# Patient Record
Sex: Female | Born: 1985
Health system: Southern US, Community
[De-identification: ages and names within clinical notes are randomized; demographics above are authoritative.]

## PROBLEM LIST (undated history)

## (undated) DIAGNOSIS — J302 Other seasonal allergic rhinitis: Secondary | ICD-10-CM

## (undated) DIAGNOSIS — E559 Vitamin D deficiency, unspecified: Secondary | ICD-10-CM

## (undated) DIAGNOSIS — K5900 Constipation, unspecified: Secondary | ICD-10-CM

## (undated) DIAGNOSIS — F419 Anxiety disorder, unspecified: Secondary | ICD-10-CM

## (undated) DIAGNOSIS — G4730 Sleep apnea, unspecified: Secondary | ICD-10-CM

## (undated) DIAGNOSIS — T7840XA Allergy, unspecified, initial encounter: Secondary | ICD-10-CM

## (undated) DIAGNOSIS — F32A Depression, unspecified: Secondary | ICD-10-CM

## (undated) DIAGNOSIS — F329 Major depressive disorder, single episode, unspecified: Secondary | ICD-10-CM

## (undated) DIAGNOSIS — D649 Anemia, unspecified: Secondary | ICD-10-CM

## (undated) DIAGNOSIS — K219 Gastro-esophageal reflux disease without esophagitis: Secondary | ICD-10-CM

## (undated) DIAGNOSIS — I10 Essential (primary) hypertension: Secondary | ICD-10-CM

## (undated) DIAGNOSIS — E282 Polycystic ovarian syndrome: Secondary | ICD-10-CM

## (undated) HISTORY — DX: Anemia, unspecified: D64.9

## (undated) HISTORY — DX: Sleep apnea, unspecified: G47.30

## (undated) HISTORY — DX: Vitamin D deficiency, unspecified: E55.9

## (undated) HISTORY — DX: Allergy, unspecified, initial encounter: T78.40XA

## (undated) HISTORY — DX: Constipation, unspecified: K59.00

## (undated) HISTORY — PX: TONSILLECTOMY: SUR1361

## (undated) HISTORY — PX: GASTRIC BYPASS: SHX52

---

## 2008-03-04 ENCOUNTER — Emergency Department (HOSPITAL_BASED_OUTPATIENT_CLINIC_OR_DEPARTMENT_OTHER): Admission: EM | Admit: 2008-03-04 | Discharge: 2008-03-04 | Payer: Self-pay | Admitting: Emergency Medicine

## 2008-05-19 ENCOUNTER — Emergency Department (HOSPITAL_BASED_OUTPATIENT_CLINIC_OR_DEPARTMENT_OTHER): Admission: EM | Admit: 2008-05-19 | Discharge: 2008-05-19 | Payer: Self-pay | Admitting: Emergency Medicine

## 2008-05-28 ENCOUNTER — Emergency Department (HOSPITAL_BASED_OUTPATIENT_CLINIC_OR_DEPARTMENT_OTHER): Admission: EM | Admit: 2008-05-28 | Discharge: 2008-05-28 | Payer: Self-pay | Admitting: Emergency Medicine

## 2009-10-23 ENCOUNTER — Emergency Department (HOSPITAL_BASED_OUTPATIENT_CLINIC_OR_DEPARTMENT_OTHER): Admission: EM | Admit: 2009-10-23 | Discharge: 2009-10-23 | Payer: Self-pay | Admitting: Emergency Medicine

## 2010-03-04 ENCOUNTER — Emergency Department (HOSPITAL_COMMUNITY): Admission: EM | Admit: 2010-03-04 | Discharge: 2010-03-04 | Payer: Self-pay | Admitting: Family Medicine

## 2010-08-27 ENCOUNTER — Emergency Department (HOSPITAL_BASED_OUTPATIENT_CLINIC_OR_DEPARTMENT_OTHER)
Admission: EM | Admit: 2010-08-27 | Discharge: 2010-08-27 | Disposition: A | Discharge status: Home / Self Care | Payer: BC Managed Care – PPO | Attending: Emergency Medicine | Admitting: Emergency Medicine

## 2010-08-27 DIAGNOSIS — N898 Other specified noninflammatory disorders of vagina: Secondary | ICD-10-CM | POA: Insufficient documentation

## 2010-08-27 DIAGNOSIS — N949 Unspecified condition associated with female genital organs and menstrual cycle: Secondary | ICD-10-CM | POA: Insufficient documentation

## 2010-08-27 DIAGNOSIS — E669 Obesity, unspecified: Secondary | ICD-10-CM | POA: Insufficient documentation

## 2010-08-27 DIAGNOSIS — F411 Generalized anxiety disorder: Secondary | ICD-10-CM | POA: Insufficient documentation

## 2010-08-27 DIAGNOSIS — K219 Gastro-esophageal reflux disease without esophagitis: Secondary | ICD-10-CM | POA: Insufficient documentation

## 2010-08-27 DIAGNOSIS — I10 Essential (primary) hypertension: Secondary | ICD-10-CM | POA: Insufficient documentation

## 2010-08-27 DIAGNOSIS — N938 Other specified abnormal uterine and vaginal bleeding: Secondary | ICD-10-CM | POA: Insufficient documentation

## 2010-08-27 LAB — GC/CHLAMYDIA PROBE AMP, GENITAL
Chlamydia, DNA Probe: NEGATIVE
GC Probe Amp, Genital: NEGATIVE

## 2010-08-27 LAB — URINALYSIS, ROUTINE W REFLEX MICROSCOPIC
Bilirubin Urine: NEGATIVE
Nitrite: NEGATIVE
Protein, ur: NEGATIVE mg/dL
Specific Gravity, Urine: 1.046 — ABNORMAL HIGH (ref 1.005–1.030)

## 2010-08-27 LAB — URINE MICROSCOPIC-ADD ON

## 2010-08-27 LAB — WET PREP, GENITAL

## 2010-08-27 LAB — PREGNANCY, URINE: Preg Test, Ur: NEGATIVE

## 2010-08-27 LAB — CBC: WBC: 10.1 10*3/uL (ref 4.0–10.5)

## 2010-10-18 LAB — URINALYSIS, ROUTINE W REFLEX MICROSCOPIC
Bilirubin Urine: NEGATIVE
Glucose, UA: NEGATIVE mg/dL
Ketones, ur: NEGATIVE mg/dL
Nitrite: NEGATIVE
Protein, ur: 30 mg/dL — AB
Specific Gravity, Urine: 1.01 (ref 1.005–1.030)
pH: 7 (ref 5.0–8.0)

## 2010-10-18 LAB — WET PREP, GENITAL: Trich, Wet Prep: NONE SEEN

## 2010-10-18 LAB — RPR: RPR Ser Ql: NONREACTIVE

## 2010-10-18 LAB — URINE MICROSCOPIC-ADD ON

## 2010-10-18 LAB — HERPES SIMPLEX VIRUS CULTURE

## 2010-11-01 ENCOUNTER — Emergency Department (HOSPITAL_BASED_OUTPATIENT_CLINIC_OR_DEPARTMENT_OTHER)
Admission: EM | Admit: 2010-11-01 | Discharge: 2010-11-01 | Disposition: A | Discharge status: Home / Self Care | Payer: BC Managed Care – PPO | Attending: Emergency Medicine | Admitting: Emergency Medicine

## 2010-11-01 DIAGNOSIS — J029 Acute pharyngitis, unspecified: Secondary | ICD-10-CM | POA: Insufficient documentation

## 2010-11-01 DIAGNOSIS — I10 Essential (primary) hypertension: Secondary | ICD-10-CM | POA: Insufficient documentation

## 2010-11-01 DIAGNOSIS — E669 Obesity, unspecified: Secondary | ICD-10-CM | POA: Insufficient documentation

## 2011-04-27 LAB — GC/CHLAMYDIA PROBE AMP, GENITAL: Chlamydia, DNA Probe: NEGATIVE

## 2011-04-27 LAB — URINALYSIS, ROUTINE W REFLEX MICROSCOPIC
Ketones, ur: NEGATIVE
Nitrite: NEGATIVE

## 2011-05-06 ENCOUNTER — Emergency Department (HOSPITAL_BASED_OUTPATIENT_CLINIC_OR_DEPARTMENT_OTHER)
Admission: EM | Admit: 2011-05-06 | Discharge: 2011-05-06 | Disposition: A | Discharge status: Home / Self Care | Payer: BC Managed Care – PPO | Attending: Emergency Medicine | Admitting: Emergency Medicine

## 2011-05-06 ENCOUNTER — Encounter: Payer: Self-pay | Admitting: Emergency Medicine

## 2011-05-06 DIAGNOSIS — A499 Bacterial infection, unspecified: Secondary | ICD-10-CM | POA: Insufficient documentation

## 2011-05-06 DIAGNOSIS — N760 Acute vaginitis: Secondary | ICD-10-CM | POA: Insufficient documentation

## 2011-05-06 DIAGNOSIS — R109 Unspecified abdominal pain: Secondary | ICD-10-CM | POA: Insufficient documentation

## 2011-05-06 DIAGNOSIS — I10 Essential (primary) hypertension: Secondary | ICD-10-CM | POA: Insufficient documentation

## 2011-05-06 DIAGNOSIS — B9689 Other specified bacterial agents as the cause of diseases classified elsewhere: Secondary | ICD-10-CM

## 2011-05-06 DIAGNOSIS — E669 Obesity, unspecified: Secondary | ICD-10-CM | POA: Insufficient documentation

## 2011-05-06 HISTORY — DX: Essential (primary) hypertension: I10

## 2011-05-06 LAB — DIFFERENTIAL
Basophils Absolute: 0 10*3/uL (ref 0.0–0.1)
Basophils Relative: 0 % (ref 0–1)
Eosinophils Absolute: 0.1 10*3/uL (ref 0.0–0.7)
Monocytes Absolute: 0.6 10*3/uL (ref 0.1–1.0)
Neutro Abs: 4.1 10*3/uL (ref 1.7–7.7)
Neutrophils Relative %: 61 % (ref 43–77)

## 2011-05-06 LAB — URINALYSIS, ROUTINE W REFLEX MICROSCOPIC
Bilirubin Urine: NEGATIVE
Leukocytes, UA: NEGATIVE
Nitrite: NEGATIVE
Protein, ur: NEGATIVE mg/dL

## 2011-05-06 LAB — PREGNANCY, URINE: Preg Test, Ur: NEGATIVE

## 2011-05-06 LAB — COMPREHENSIVE METABOLIC PANEL
Albumin: 3.3 g/dL — ABNORMAL LOW (ref 3.5–5.2)
Alkaline Phosphatase: 79 U/L (ref 39–117)
BUN: 9 mg/dL (ref 6–23)
Calcium: 9 mg/dL (ref 8.4–10.5)
GFR calc Af Amer: >90 mL/min (ref >90)
Potassium: 4 mEq/L (ref 3.5–5.1)
Total Protein: 6.9 g/dL (ref 6.0–8.3)

## 2011-05-06 LAB — WET PREP, GENITAL
Trich, Wet Prep: NONE SEEN
Yeast Wet Prep HPF POC: NONE SEEN

## 2011-05-06 LAB — CBC
HCT: 36.3 % (ref 36.0–46.0)
MCH: 22.4 pg — ABNORMAL LOW (ref 26.0–34.0)
MCHC: 33.3 g/dL (ref 30.0–36.0)
RDW: 15 % (ref 11.5–15.5)

## 2011-05-06 MED ORDER — METRONIDAZOLE 500 MG PO TABS: 500.0000 mg | ORAL_TABLET | Freq: Two times a day (BID) | ORAL | Status: AC

## 2011-05-06 MED ORDER — IBUPROFEN 800 MG PO TABS
800.0000 mg | ORAL_TABLET | Freq: Once | ORAL | Status: AC
  Administered 2011-05-06: 800 mg via ORAL
  Filled 2011-05-06: qty 1

## 2011-05-06 MED ORDER — IBUPROFEN 600 MG PO TABS: 600.0000 mg | ORAL_TABLET | Freq: Four times a day (QID) | ORAL | Status: AC | PRN

## 2011-05-06 NOTE — ED Provider Notes (Signed)
History     CSN: [id number] Arrival date & time: 05/06/2011  7:02 AM  Chief Complaint  Patient presents with  . Abdominal Pain     HPI 25 year old female previously healthy presents with abdominal pain. The patient complains of diffuse lower abdominal pain. States that she did have the pain yesterday and was better though o'clock this morning. Describes as crampy and sharp with some radiation to her lower back. At its maximum the pain as an 8 out of 10 She denies nausea, vomiting, fever, chills. She denies hematuria frequency urgency dysuria. She denies vaginal discharge. She states that her last menstrual period was September 12. No history of abdominal surgeries. No sick contacts. She denies other complaints including chest pain shortness of breath palpitations. She denies pain in her legs, she denies numbness tingling weakness of her legs.  Past Medical History  Diagnosis Date  . Hypertension     Past Surgical History  Procedure Date  . Tonsillectomy     History reviewed. No pertinent family history.  History  Substance Use Topics  . Smoking status: Never Smoker   . Smokeless tobacco: Not on file  . Alcohol Use: No    OB History    Grav Para Term Preterm Abortions TAB SAB Ect Mult Living                  Review of Systems Negative except as noted in history of present illness  Allergies  Review of patient's allergies indicates not on file.  Home Medications   Current Outpatient Rx  Name Route Sig Dispense Refill  . HYDROCHLOROTHIAZIDE 12.5 MG PO TABS Oral Take 12.5 mg by mouth daily.      . IBUPROFEN 600 MG PO TABS Oral Take 1 tablet (600 mg total) by mouth every 6 (six) hours as needed for pain. 30 tablet 0  . METRONIDAZOLE 500 MG PO TABS Oral Take 1 tablet (500 mg total) by mouth 2 (two) times daily. 14 tablet 0    BP 140/96  Pulse 103  Temp(Src) 97.8 F (36.6 C) (Oral)  Resp 18  SpO2 100%  LMP 04/07/2011  Physical Exam  Nursing note and vitals  reviewed. Constitutional: She is oriented to person, place, and time. She appears well-developed.  HENT:  Head: Atraumatic.  Mouth/Throat: Oropharynx is clear and moist.  Eyes: Conjunctivae and EOM are normal. Pupils are equal, round, and reactive to light.  Neck: Normal range of motion. Neck supple.  Cardiovascular: Normal rate, regular rhythm, normal heart sounds and intact distal pulses.   Pulmonary/Chest: Effort normal and breath sounds normal. No respiratory distress. She has no wheezes. She has no rales.  Abdominal: Soft. She exhibits no distension and no mass. There is no tenderness. There is no rebound and no guarding.       Obese mild diffuse lower abdominal tenderness to palpation  Genitourinary: Vaginal discharge found.       White vaginal discharge no cervical motion tenderness no right or left adnexal tenderness  Musculoskeletal: Normal range of motion.  Neurological: She is alert and oriented to person, place, and time.  Skin: Skin is warm and dry. No rash noted.  Psychiatric: She has a normal mood and affect.    ED Course  Procedures (including critical care time)  Labs Reviewed  URINALYSIS, ROUTINE W REFLEX MICROSCOPIC - Abnormal; Notable for the following:    Appearance CLOUDY (*)    All other components within normal limits  CBC - Abnormal; Notable for the  following:    RBC 5.39 (*)    MCV 67.3 (*)    MCH 22.4 (*)    All other components within normal limits  COMPREHENSIVE METABOLIC PANEL - Abnormal; Notable for the following:    Glucose, Bld 104 (*)    Albumin 3.3 (*)    All other components within normal limits  WET PREP, GENITAL - Abnormal; Notable for the following:    Clue Cells, Wet Prep TOO NUMEROUS TO COUNT (*)    WBC, Wet Prep HPF POC FEW (*)    All other components within normal limits  PREGNANCY, URINE  DIFFERENTIAL  GC/CHLAMYDIA PROBE AMP, GENITAL   No results found.   1. Abdominal pain   2. Bacterial vaginosis     MDM  Appearing  25 year old female presents with abdominal pain. Her abdomen is benign. Her labs are unremarkable at this time except for numerous clue cells on her wet prep. Treat with Flagyl. Primary care followup precautions for return.  Stefano Gaul, MD      Forbes Cellar, MD 05/06/11 [phone]

## 2011-05-06 NOTE — ED Notes (Signed)
Pelvic cart set up 

## 2011-05-06 NOTE — ED Notes (Signed)
Report received from Kellie Neal, RN, care assumed. 

## 2011-05-06 NOTE — ED Notes (Signed)
Pt c/o lower abd pain. Denies n/v/d. Denies urinary symptoms. Last BM yesterday.

## 2011-05-07 LAB — GC/CHLAMYDIA PROBE AMP, GENITAL: GC Probe Amp, Genital: NEGATIVE

## 2012-03-05 ENCOUNTER — Emergency Department (HOSPITAL_BASED_OUTPATIENT_CLINIC_OR_DEPARTMENT_OTHER): Payer: BC Managed Care – PPO

## 2012-03-05 ENCOUNTER — Encounter (HOSPITAL_BASED_OUTPATIENT_CLINIC_OR_DEPARTMENT_OTHER): Payer: Self-pay | Admitting: *Deleted

## 2012-03-05 ENCOUNTER — Emergency Department (HOSPITAL_BASED_OUTPATIENT_CLINIC_OR_DEPARTMENT_OTHER)
Admission: EM | Admit: 2012-03-05 | Discharge: 2012-03-05 | Disposition: A | Discharge status: Home / Self Care | Payer: BC Managed Care – PPO | Attending: Emergency Medicine | Admitting: Emergency Medicine

## 2012-03-05 DIAGNOSIS — S61219A Laceration without foreign body of unspecified finger without damage to nail, initial encounter: Secondary | ICD-10-CM

## 2012-03-05 DIAGNOSIS — S61209A Unspecified open wound of unspecified finger without damage to nail, initial encounter: Secondary | ICD-10-CM | POA: Insufficient documentation

## 2012-03-05 DIAGNOSIS — F411 Generalized anxiety disorder: Secondary | ICD-10-CM | POA: Insufficient documentation

## 2012-03-05 DIAGNOSIS — F3289 Other specified depressive episodes: Secondary | ICD-10-CM | POA: Insufficient documentation

## 2012-03-05 DIAGNOSIS — W230XXA Caught, crushed, jammed, or pinched between moving objects, initial encounter: Secondary | ICD-10-CM | POA: Insufficient documentation

## 2012-03-05 DIAGNOSIS — K219 Gastro-esophageal reflux disease without esophagitis: Secondary | ICD-10-CM | POA: Insufficient documentation

## 2012-03-05 DIAGNOSIS — I10 Essential (primary) hypertension: Secondary | ICD-10-CM | POA: Insufficient documentation

## 2012-03-05 DIAGNOSIS — F329 Major depressive disorder, single episode, unspecified: Secondary | ICD-10-CM | POA: Insufficient documentation

## 2012-03-05 HISTORY — DX: Depression, unspecified: F32.A

## 2012-03-05 HISTORY — DX: Major depressive disorder, single episode, unspecified: F32.9

## 2012-03-05 HISTORY — DX: Anxiety disorder, unspecified: F41.9

## 2012-03-05 MED ORDER — HYDROCODONE-ACETAMINOPHEN 5-325 MG PO TABS: 2.0000 | ORAL_TABLET | ORAL | Status: AC | PRN

## 2012-03-05 MED ORDER — HYDROCODONE-ACETAMINOPHEN 5-325 MG PO TABS
ORAL_TABLET | ORAL | Status: AC
  Administered 2012-03-05: 2 via ORAL
  Filled 2012-03-05: qty 2

## 2012-03-05 MED ORDER — HYDROCODONE-ACETAMINOPHEN 5-325 MG PO TABS
2.0000 | ORAL_TABLET | Freq: Once | ORAL | Status: AC
  Administered 2012-03-05: 2 via ORAL

## 2012-03-05 NOTE — ED Provider Notes (Signed)
Medical screening examination/treatment/procedure(s) were performed by non-physician practitioner and as supervising physician I was immediately available for consultation/collaboration.   Charles B. Bernette Mayers, MD 03/05/12 1506

## 2012-03-05 NOTE — ED Provider Notes (Signed)
History     CSN: [id number]  Arrival date & time 03/05/12  1314   First MD Initiated Contact with Patient 03/05/12 1400      Chief Complaint  Patient presents with  . Finger Injury    (Consider location/radiation/quality/duration/timing/severity/associated sxs/prior treatment) Patient is a 26 y.o. female presenting with hand pain. The history is provided by the patient. No language interpreter was used.  Hand Pain This is a new problem. The current episode started today. The problem occurs constantly. The problem has been unchanged. Associated symptoms include joint swelling. Nothing aggravates the symptoms. She has tried nothing for the symptoms. The treatment provided mild relief.   Pt reports finger was caught in a car door.  Pt complains of pain in finger.  Cut to finger Past Medical History  Diagnosis Date  . Hypertension   . Depression   . Reflux   . Anxiety     Past Surgical History  Procedure Date  . Tonsillectomy     No family history on file.  History  Substance Use Topics  . Smoking status: Never Smoker   . Smokeless tobacco: Not on file  . Alcohol Use: No    OB History    Grav Para Term Preterm Abortions TAB SAB Ect Mult Living                  Review of Systems  Musculoskeletal: Positive for joint swelling.  Skin: Positive for wound.  All other systems reviewed and are negative.    Allergies  Review of patient's allergies indicates no known allergies.  Home Medications   Current Outpatient Rx  Name Route Sig Dispense Refill  . FLUOXETINE HCL 20 MG PO CAPS Oral Take 20 mg by mouth daily.    . ACETAMINOPHEN 500 MG PO TABS Oral Take 1,000 mg by mouth daily.      . BUPROPION HCL ER (XL) 150 MG PO TB24 Oral Take 150 mg by mouth daily.      Marland Kitchen HYDROCHLOROTHIAZIDE 12.5 MG PO TABS Oral Take 12.5 mg by mouth daily.     Marland Kitchen METFORMIN HCL ER 500 MG PO TB24 Oral Take 1,000 mg by mouth daily after supper.      Marland Kitchen ALIVE WOMENS ENERGY PO TABS Oral Take 1  tablet by mouth daily.      Marland Kitchen RANITIDINE HCL 150 MG PO TABS Oral Take 150 mg by mouth 2 (two) times daily.        Pulse 76  Temp 98.1 F (36.7 C) (Oral)  Resp 20  Ht 5\' 2"  (1.575 m)  Wt 245 lb (111.131 kg)  BMI 44.81 kg/m2  SpO2 100%  LMP 02/04/2012  Physical Exam  Nursing note and vitals reviewed. Constitutional: She appears well-developed and well-nourished.  HENT:  Head: Normocephalic.  Musculoskeletal: She exhibits tenderness.       superficail laceration distal right index finger,  From,  nv and ns intact  Neurological: She is alert.  Psychiatric: She has a normal mood and affect.    ED Course  Procedures (including critical care time)  Labs Reviewed - No data to display Dg Finger Index Right  03/05/2012  *RADIOLOGY REPORT*  Clinical Data: Smashed right index finger in car door  RIGHT INDEX FINGER 2+V  Comparison: None.  Findings: No fracture or dislocation.  Joint spaces are preserved. Regional soft tissues are normal.  No radiopaque foreign body.  IMPRESSION: No fracture or radiopaque foreign body.  Original Report Authenticated By: Alfredia Ferguson  V, M.D.     1. Laceration of finger       MDM  Steri strips to wound.   Pt advised follow up with her Md for recheck in 3-4 days.        Lonia Skinner [place], [place] 03/05/12 1451

## 2012-03-05 NOTE — ED Notes (Signed)
Right index finger caught in car door.  Laceration.

## 2012-07-09 ENCOUNTER — Emergency Department (HOSPITAL_BASED_OUTPATIENT_CLINIC_OR_DEPARTMENT_OTHER)
Admission: EM | Admit: 2012-07-09 | Discharge: 2012-07-09 | Disposition: A | Discharge status: Home / Self Care | Payer: PRIVATE HEALTH INSURANCE | Attending: Emergency Medicine | Admitting: Emergency Medicine

## 2012-07-09 ENCOUNTER — Emergency Department (HOSPITAL_BASED_OUTPATIENT_CLINIC_OR_DEPARTMENT_OTHER): Payer: PRIVATE HEALTH INSURANCE

## 2012-07-09 ENCOUNTER — Encounter (HOSPITAL_BASED_OUTPATIENT_CLINIC_OR_DEPARTMENT_OTHER): Payer: Self-pay | Admitting: Emergency Medicine

## 2012-07-09 DIAGNOSIS — J40 Bronchitis, not specified as acute or chronic: Secondary | ICD-10-CM | POA: Insufficient documentation

## 2012-07-09 DIAGNOSIS — Z8742 Personal history of other diseases of the female genital tract: Secondary | ICD-10-CM | POA: Insufficient documentation

## 2012-07-09 DIAGNOSIS — F411 Generalized anxiety disorder: Secondary | ICD-10-CM | POA: Insufficient documentation

## 2012-07-09 DIAGNOSIS — F3289 Other specified depressive episodes: Secondary | ICD-10-CM | POA: Insufficient documentation

## 2012-07-09 DIAGNOSIS — I10 Essential (primary) hypertension: Secondary | ICD-10-CM | POA: Insufficient documentation

## 2012-07-09 DIAGNOSIS — Z8719 Personal history of other diseases of the digestive system: Secondary | ICD-10-CM | POA: Insufficient documentation

## 2012-07-09 DIAGNOSIS — H9209 Otalgia, unspecified ear: Secondary | ICD-10-CM | POA: Insufficient documentation

## 2012-07-09 DIAGNOSIS — R0789 Other chest pain: Secondary | ICD-10-CM | POA: Insufficient documentation

## 2012-07-09 DIAGNOSIS — F329 Major depressive disorder, single episode, unspecified: Secondary | ICD-10-CM | POA: Insufficient documentation

## 2012-07-09 DIAGNOSIS — Z79899 Other long term (current) drug therapy: Secondary | ICD-10-CM | POA: Insufficient documentation

## 2012-07-09 DIAGNOSIS — J309 Allergic rhinitis, unspecified: Secondary | ICD-10-CM | POA: Insufficient documentation

## 2012-07-09 HISTORY — DX: Other seasonal allergic rhinitis: J30.2

## 2012-07-09 HISTORY — DX: Gastro-esophageal reflux disease without esophagitis: K21.9

## 2012-07-09 HISTORY — DX: Polycystic ovarian syndrome: E28.2

## 2012-07-09 MED ORDER — AZITHROMYCIN 250 MG PO TABS: ORAL_TABLET | ORAL | Status: DC

## 2012-07-09 MED ORDER — ALBUTEROL SULFATE HFA 108 (90 BASE) MCG/ACT IN AERS: 2.0000 | INHALATION_SPRAY | Freq: Four times a day (QID) | RESPIRATORY_TRACT | Status: DC | PRN

## 2012-07-09 NOTE — ED Provider Notes (Signed)
History     CSN: [id number]  Arrival date & time 07/09/12  0708   First MD Initiated Contact with Patient 07/09/12 [phone]      Chief Complaint  Patient presents with  . Cough  . Otalgia  . Chest Pain    (Consider location/radiation/quality/duration/timing/severity/associated sxs/prior treatment) HPI Comments: Patient presents with one week of upper respiratory symptoms including rhinorrhea, dry cough, chills. No documented fever. No vomiting, nausea diarrhea. Taking Zantac without relief. Pelvis nonproductive she has no sick contacts. She has some chest pain with coughing. Denies any abdominal pain, back pain, neck pain or stiffness. She denies a history of asthma, she does not smoke.  The history is provided by the patient.    Past Medical History  Diagnosis Date  . Hypertension   . Depression   . Anxiety   . GERD (gastroesophageal reflux disease)   . Polycystic ovarian disease   . Seasonal allergies     Past Surgical History  Procedure Date  . Tonsillectomy     No family history on file.  History  Substance Use Topics  . Smoking status: Never Smoker   . Smokeless tobacco: Not on file  . Alcohol Use: No    OB History    Grav Para Term Preterm Abortions TAB SAB Ect Mult Living                  Review of Systems  Constitutional: Negative for fever, activity change and appetite change.  HENT: Positive for ear pain, congestion and rhinorrhea. Negative for sore throat and trouble swallowing.   Respiratory: Positive for cough and chest tightness.   Cardiovascular: Positive for chest pain.  Gastrointestinal: Negative for nausea, vomiting and abdominal pain.  Genitourinary: Negative for dysuria and hematuria.  Musculoskeletal: Negative for back pain.  Neurological: Negative for dizziness.    Allergies  Review of patient's allergies indicates no known allergies.  Home Medications   Current Outpatient Rx  Name  Route  Sig  Dispense  Refill  . LORATADINE 10  MG PO TABS   Oral   Take 10 mg by mouth daily.         . ACETAMINOPHEN 500 MG PO TABS   Oral   Take 1,000 mg by mouth daily.           . ALBUTEROL SULFATE HFA 108 (90 BASE) MCG/ACT IN AERS   Inhalation   Inhale 2 puffs into the lungs every 6 (six) hours as needed for wheezing.   1 Inhaler   2   . AZITHROMYCIN 250 MG PO TABS      2 tabs PO today, 1 tab PO daily   6 each   0   . FLUOXETINE HCL 20 MG PO CAPS   Oral   Take 20 mg by mouth daily.         Marland Kitchen HYDROCHLOROTHIAZIDE 12.5 MG PO TABS   Oral   Take 12.5 mg by mouth daily.          Marland Kitchen METFORMIN HCL ER 500 MG PO TB24   Oral   Take 1,000 mg by mouth daily after supper.           Marland Kitchen ALIVE WOMENS ENERGY PO TABS   Oral   Take 1 tablet by mouth daily.           Marland Kitchen RANITIDINE HCL 150 MG PO TABS   Oral   Take 150 mg by mouth 2 (two) times daily.  BP 147/112  Pulse 86  Temp 98.6 F (37 C) (Oral)  Resp 20  SpO2 100%  LMP 06/29/2012  Physical Exam  Constitutional: She is oriented to person, place, and time. She appears well-nourished. No distress.  HENT:  Head: Normocephalic and atraumatic.  Right Ear: External ear normal.  Left Ear: External ear normal.  Mouth/Throat: Oropharynx is clear and moist. No oropharyngeal exudate.       Mild erythema, no exudate or asymmetry  Eyes: Conjunctivae normal and EOM are normal. Pupils are equal, round, and reactive to light.  Neck: Normal range of motion. Neck supple.       No meningismus  Cardiovascular: Normal rate, regular rhythm and normal heart sounds.   Pulmonary/Chest: Effort normal and breath sounds normal. No respiratory distress. She has no wheezes.  Abdominal: Soft. There is no tenderness. There is no rebound and no guarding.  Musculoskeletal: Normal range of motion. She exhibits no edema and no tenderness.  Neurological: She is alert and oriented to person, place, and time. No cranial nerve deficit. She exhibits normal muscle tone.  Coordination normal.  Skin: Skin is warm.    ED Course  Procedures (including critical care time)  Labs Reviewed - No data to display Dg Chest 2 View  07/09/2012  *RADIOLOGY REPORT*  Clinical Data: Cough  CHEST - 2 VIEW  Comparison: None.  Findings: Normal mediastinum and cardiac silhouette.  Normal pulmonary  vasculature.  No evidence of effusion, infiltrate, or pneumothorax.  No acute bony abnormality.  IMPRESSION: No acute cardiopulmonary process.   Original Report Authenticated By: Genevive Bi, M.D.      1. Bronchitis       MDM  Upper respiratory symptoms for one week with cough, chills, runny nose. Vital stable, no distress, lungs clear. Nontoxic appearing  Chest x-ray to rule out pneumonia, treat for bronchitis.  Chest x-ray negative. Given coarse of illness, will treat with azithromycin for bronchitis. Followup with PCP.       Glynn Octave, MD 07/09/12 [phone]

## 2012-07-09 NOTE — ED Notes (Signed)
Pt having upper respiratory symptoms.  Runny nose, cough, chills, chest tightness with coughing.  No N/V/D.

## 2012-07-09 NOTE — ED Notes (Signed)
Patient transported to X-ray 

## 2013-01-03 ENCOUNTER — Emergency Department (HOSPITAL_BASED_OUTPATIENT_CLINIC_OR_DEPARTMENT_OTHER): Payer: PRIVATE HEALTH INSURANCE

## 2013-01-03 ENCOUNTER — Encounter (HOSPITAL_BASED_OUTPATIENT_CLINIC_OR_DEPARTMENT_OTHER): Payer: Self-pay | Admitting: *Deleted

## 2013-01-03 ENCOUNTER — Emergency Department (HOSPITAL_BASED_OUTPATIENT_CLINIC_OR_DEPARTMENT_OTHER)
Admission: EM | Admit: 2013-01-03 | Discharge: 2013-01-03 | Disposition: A | Discharge status: Home / Self Care | Payer: PRIVATE HEALTH INSURANCE | Attending: Emergency Medicine | Admitting: Emergency Medicine

## 2013-01-03 DIAGNOSIS — R071 Chest pain on breathing: Secondary | ICD-10-CM | POA: Insufficient documentation

## 2013-01-03 DIAGNOSIS — F411 Generalized anxiety disorder: Secondary | ICD-10-CM | POA: Insufficient documentation

## 2013-01-03 DIAGNOSIS — Z862 Personal history of diseases of the blood and blood-forming organs and certain disorders involving the immune mechanism: Secondary | ICD-10-CM | POA: Insufficient documentation

## 2013-01-03 DIAGNOSIS — Z3202 Encounter for pregnancy test, result negative: Secondary | ICD-10-CM | POA: Insufficient documentation

## 2013-01-03 DIAGNOSIS — F329 Major depressive disorder, single episode, unspecified: Secondary | ICD-10-CM | POA: Insufficient documentation

## 2013-01-03 DIAGNOSIS — Z8639 Personal history of other endocrine, nutritional and metabolic disease: Secondary | ICD-10-CM | POA: Insufficient documentation

## 2013-01-03 DIAGNOSIS — F3289 Other specified depressive episodes: Secondary | ICD-10-CM | POA: Insufficient documentation

## 2013-01-03 DIAGNOSIS — I10 Essential (primary) hypertension: Secondary | ICD-10-CM | POA: Insufficient documentation

## 2013-01-03 DIAGNOSIS — R0789 Other chest pain: Secondary | ICD-10-CM

## 2013-01-03 DIAGNOSIS — K219 Gastro-esophageal reflux disease without esophagitis: Secondary | ICD-10-CM | POA: Insufficient documentation

## 2013-01-03 DIAGNOSIS — Z79899 Other long term (current) drug therapy: Secondary | ICD-10-CM | POA: Insufficient documentation

## 2013-01-03 LAB — D-DIMER, QUANTITATIVE: D-Dimer, Quant: <0.27 ug/mL-FEU (ref 0.00–0.48)

## 2013-01-03 MED ORDER — TRAMADOL HCL 50 MG PO TABS: 50.0000 mg | ORAL_TABLET | Freq: Four times a day (QID) | ORAL | Status: DC | PRN

## 2013-01-03 MED ORDER — MELOXICAM 7.5 MG PO TABS: 7.5000 mg | ORAL_TABLET | Freq: Every day | ORAL | Status: DC

## 2013-01-03 MED ORDER — METHOCARBAMOL 500 MG PO TABS
1000.0000 mg | ORAL_TABLET | Freq: Once | ORAL | Status: AC
  Administered 2013-01-03: 1000 mg via ORAL
  Filled 2013-01-03: qty 2

## 2013-01-03 MED ORDER — TRAMADOL HCL 50 MG PO TABS
50.0000 mg | ORAL_TABLET | Freq: Once | ORAL | Status: AC
  Administered 2013-01-03: 50 mg via ORAL
  Filled 2013-01-03: qty 1

## 2013-01-03 MED ORDER — KETOROLAC TROMETHAMINE 30 MG/ML IJ SOLN
30.0000 mg | Freq: Once | INTRAMUSCULAR | Status: AC
  Administered 2013-01-03: 30 mg via INTRAVENOUS
  Filled 2013-01-03: qty 1

## 2013-01-03 NOTE — ED Notes (Signed)
MD at bedside. 

## 2013-01-03 NOTE — ED Notes (Signed)
C/o right sided CP since yesterday. Denies N/V, no diaphoresis. PT states that pain worsens with deep breath.

## 2013-01-03 NOTE — ED Provider Notes (Signed)
History     CSN: [id number]  Arrival date & time 01/03/13  0407   None     Chief Complaint  Patient presents with  . Chest Pain    (Consider location/radiation/quality/duration/timing/severity/associated sxs/prior treatment) Patient is a 27 y.o. female presenting with chest pain. The history is provided by the patient.  Chest Pain Pain location:  R chest Pain quality: sharp   Pain radiates to:  Does not radiate Pain radiates to the back: no   Pain severity:  Moderate Onset quality:  Gradual Duration:  1 day Timing:  Constant Progression:  Unchanged Chronicity:  New Context: raising an arm   Context: no stress   Relieved by:  Nothing Worsened by:  Nothing tried Ineffective treatments:  None tried Associated symptoms: no abdominal pain, no back pain, no cough, no fever, no orthopnea, no palpitations, no shortness of breath, no syncope and not vomiting   Risk factors: no birth control, no coronary artery disease, no prior DVT/PE, no smoking and no surgery   Pain is worse with palpation and movement.  No DOE no SOB.  No leg swelling no long car trips or plane trips.    Past Medical History  Diagnosis Date  . Hypertension   . Depression   . Anxiety   . GERD (gastroesophageal reflux disease)   . Polycystic ovarian disease   . Seasonal allergies     Past Surgical History  Procedure Laterality Date  . Tonsillectomy      History reviewed. No pertinent family history.  History  Substance Use Topics  . Smoking status: Never Smoker   . Smokeless tobacco: Not on file  . Alcohol Use: No    OB History   Grav Para Term Preterm Abortions TAB SAB Ect Mult Living                  Review of Systems  Constitutional: Negative for fever.  HENT: Negative for neck pain.   Respiratory: Negative for cough, chest tightness and shortness of breath.   Cardiovascular: Positive for chest pain. Negative for palpitations, orthopnea and syncope.  Gastrointestinal: Negative for  vomiting and abdominal pain.  Musculoskeletal: Negative for back pain.  All other systems reviewed and are negative.    Allergies  Review of patient's allergies indicates no known allergies.  Home Medications   Current Outpatient Rx  Name  Route  Sig  Dispense  Refill  . albuterol (PROVENTIL HFA;VENTOLIN HFA) 108 (90 BASE) MCG/ACT inhaler   Inhalation   Inhale 2 puffs into the lungs every 6 (six) hours as needed for wheezing.   1 Inhaler   2   . FLUoxetine (PROZAC) 20 MG capsule   Oral   Take 20 mg by mouth daily.         . hydrochlorothiazide (HYDRODIURIL) 12.5 MG tablet   Oral   Take 12.5 mg by mouth daily.          Marland Kitchen loratadine (CLARITIN) 10 MG tablet   Oral   Take 10 mg by mouth daily.         . metFORMIN (GLUCOPHAGE-XR) 500 MG 24 hr tablet   Oral   Take 1,000 mg by mouth daily after supper.           . Multiple Vitamins-Minerals (ALIVE WOMENS ENERGY) TABS   Oral   Take 1 tablet by mouth daily.           . ranitidine (ZANTAC) 150 MG tablet   Oral   Take  150 mg by mouth 2 (two) times daily.           Marland Kitchen acetaminophen (TYLENOL) 500 MG tablet   Oral   Take 1,000 mg by mouth daily.           Marland Kitchen azithromycin (ZITHROMAX) 250 MG tablet      2 tabs PO today, 1 tab PO daily   6 each   0     BP 126/92  Pulse 84  Temp(Src) 98.4 F (36.9 C) (Oral)  Resp 20  Ht 5\' 1"  (1.549 m)  Wt 250 lb (113.399 kg)  BMI 47.26 kg/m2  SpO2 100%  LMP 12/21/2012  Physical Exam  Constitutional: She is oriented to person, place, and time. She appears well-developed and well-nourished.  HENT:  Head: Normocephalic and atraumatic.  Eyes: Conjunctivae are normal. Pupils are equal, round, and reactive to light.  Neck: Normal range of motion. Neck supple.  Cardiovascular: Normal rate, regular rhythm and intact distal pulses.   Pulmonary/Chest: Effort normal and breath sounds normal. She has no wheezes. She has no rales. She exhibits tenderness.  Abdominal: Soft.  Bowel sounds are normal. There is no tenderness. There is no rebound and no guarding.  Musculoskeletal: Normal range of motion. She exhibits no edema and no tenderness.  Neurological: She is alert and oriented to person, place, and time.  Skin: Skin is warm and dry.  Psychiatric: She has a normal mood and affect.    ED Course  Procedures (including critical care time)  Labs Reviewed  PREGNANCY, URINE   No results found.   No diagnosis found.   Wells criteria 0 MDM   Date: 01/03/2013  Rate:83  Rhythm: normal sinus rhythm  QRS Axis: normal  Intervals: normal  ST/T Wave abnormalities: normal  Conduction Disutrbances: none  Narrative Interpretation: unremarkable        Symptoms consistent with chest wall pain will treat  Kalaya Infantino K See Beharry-Rasch, MD 01/03/13 [phone]

## 2013-01-03 NOTE — Discharge Instructions (Signed)

## 2013-05-11 ENCOUNTER — Encounter (HOSPITAL_BASED_OUTPATIENT_CLINIC_OR_DEPARTMENT_OTHER): Payer: Self-pay | Admitting: Emergency Medicine

## 2013-05-11 ENCOUNTER — Emergency Department (HOSPITAL_BASED_OUTPATIENT_CLINIC_OR_DEPARTMENT_OTHER)
Admission: EM | Admit: 2013-05-11 | Discharge: 2013-05-11 | Disposition: A | Discharge status: Home / Self Care | Payer: PRIVATE HEALTH INSURANCE | Attending: Emergency Medicine | Admitting: Emergency Medicine

## 2013-05-11 DIAGNOSIS — F3289 Other specified depressive episodes: Secondary | ICD-10-CM | POA: Insufficient documentation

## 2013-05-11 DIAGNOSIS — R110 Nausea: Secondary | ICD-10-CM | POA: Insufficient documentation

## 2013-05-11 DIAGNOSIS — Z3202 Encounter for pregnancy test, result negative: Secondary | ICD-10-CM | POA: Insufficient documentation

## 2013-05-11 DIAGNOSIS — Z8639 Personal history of other endocrine, nutritional and metabolic disease: Secondary | ICD-10-CM | POA: Insufficient documentation

## 2013-05-11 DIAGNOSIS — B9689 Other specified bacterial agents as the cause of diseases classified elsewhere: Secondary | ICD-10-CM | POA: Insufficient documentation

## 2013-05-11 DIAGNOSIS — F411 Generalized anxiety disorder: Secondary | ICD-10-CM | POA: Insufficient documentation

## 2013-05-11 DIAGNOSIS — Z862 Personal history of diseases of the blood and blood-forming organs and certain disorders involving the immune mechanism: Secondary | ICD-10-CM | POA: Insufficient documentation

## 2013-05-11 DIAGNOSIS — Z79899 Other long term (current) drug therapy: Secondary | ICD-10-CM | POA: Insufficient documentation

## 2013-05-11 DIAGNOSIS — A499 Bacterial infection, unspecified: Secondary | ICD-10-CM | POA: Insufficient documentation

## 2013-05-11 DIAGNOSIS — N760 Acute vaginitis: Secondary | ICD-10-CM | POA: Insufficient documentation

## 2013-05-11 DIAGNOSIS — F329 Major depressive disorder, single episode, unspecified: Secondary | ICD-10-CM | POA: Insufficient documentation

## 2013-05-11 DIAGNOSIS — K219 Gastro-esophageal reflux disease without esophagitis: Secondary | ICD-10-CM | POA: Insufficient documentation

## 2013-05-11 DIAGNOSIS — I10 Essential (primary) hypertension: Secondary | ICD-10-CM | POA: Insufficient documentation

## 2013-05-11 LAB — WET PREP, GENITAL
Trich, Wet Prep: NONE SEEN
Yeast Wet Prep HPF POC: NONE SEEN

## 2013-05-11 LAB — URINE MICROSCOPIC-ADD ON

## 2013-05-11 LAB — URINALYSIS, ROUTINE W REFLEX MICROSCOPIC
Bilirubin Urine: NEGATIVE
Glucose, UA: NEGATIVE mg/dL
Hgb urine dipstick: NEGATIVE
Ketones, ur: NEGATIVE mg/dL
Specific Gravity, Urine: 1.026 (ref 1.005–1.030)
pH: 5.5 (ref 5.0–8.0)

## 2013-05-11 MED ORDER — TRAMADOL HCL 50 MG PO TABS: 50.0000 mg | ORAL_TABLET | Freq: Four times a day (QID) | ORAL | Status: DC | PRN

## 2013-05-11 MED ORDER — HYDROCODONE-ACETAMINOPHEN 5-325 MG PO TABS
2.0000 | ORAL_TABLET | Freq: Once | ORAL | Status: AC
  Administered 2013-05-11: 2 via ORAL
  Filled 2013-05-11: qty 2

## 2013-05-11 MED ORDER — METRONIDAZOLE 500 MG PO TABS: 500.0000 mg | ORAL_TABLET | Freq: Two times a day (BID) | ORAL | Status: DC

## 2013-05-11 NOTE — ED Notes (Signed)
Bil  Sharp low abd pain x4 days intermittently.  Constant since yesterday. Denies vomiting or diarrhea.  BM nml. Denies urinary sx.  Intermittent nausea with some smells.

## 2013-05-11 NOTE — ED Provider Notes (Signed)
CSN: [id number]     Arrival date & time 05/11/13  1831 History  This chart was scribed for Rolan Bucco, MD by Danella Maiers, ED Scribe. This patient was seen in room MH09/MH09 and the patient's care was started at 7:02 PM.   Chief Complaint  Patient presents with  . Abdominal Pain   Patient is a 27 y.o. female presenting with abdominal pain. The history is provided by the patient. No language interpreter was used.  Abdominal Pain Pain location:  Suprapubic Pain quality: sharp   Pain radiates to:  Suprapubic region and back Duration:  4 days Associated symptoms: nausea   Associated symptoms: no chest pain, no chills, no cough, no diarrhea, no fatigue, no fever, no hematuria, no shortness of breath and no vomiting    HPI Comments: Natasha Rice is a 27 y.o. female who presents to the Emergency Department complaining of sharp, intermittent suprapubic abdominal pain that radiates across her abdomen and to her lower back onset four days ago. She states it was intermittent the first three days but has been constant in the last 24 hrs. She states it feels the same as menstrual cramps but she is not on her period. She reports nausea with certain smells. She has taken tylenol and ibuprofen with no relief. She is sexually active. She has been having normal periods. She denies any urinary or vaginal symptoms. She was told by one MD that she had PCOS but a different MD denied it recently.   Past Medical History  Diagnosis Date  . Hypertension   . Depression   . Anxiety   . GERD (gastroesophageal reflux disease)   . Polycystic ovarian disease   . Seasonal allergies    Past Surgical History  Procedure Laterality Date  . Tonsillectomy     No family history on file. History  Substance Use Topics  . Smoking status: Never Smoker   . Smokeless tobacco: Not on file  . Alcohol Use: Yes     Comment: Occ   OB History   Grav Para Term Preterm Abortions TAB SAB Ect Mult Living                  Review of Systems  Constitutional: Negative for fever, chills, diaphoresis and fatigue.  HENT: Negative for congestion, rhinorrhea and sneezing.   Eyes: Negative.   Respiratory: Negative for cough, chest tightness and shortness of breath.   Cardiovascular: Negative for chest pain and leg swelling.  Gastrointestinal: Positive for nausea and abdominal pain. Negative for vomiting, diarrhea and blood in stool.  Genitourinary: Negative for frequency, hematuria, flank pain and difficulty urinating.  Musculoskeletal: Negative for arthralgias and back pain.  Skin: Negative for rash.  Neurological: Negative for dizziness, speech difficulty, weakness, numbness and headaches.  All other systems reviewed and are negative.    Allergies  Review of patient's allergies indicates no known allergies.  Home Medications   Current Outpatient Rx  Name  Route  Sig  Dispense  Refill  . acetaminophen (TYLENOL) 500 MG tablet   Oral   Take 1,000 mg by mouth daily.           Marland Kitchen albuterol (PROVENTIL HFA;VENTOLIN HFA) 108 (90 BASE) MCG/ACT inhaler   Inhalation   Inhale 2 puffs into the lungs every 6 (six) hours as needed for wheezing.   1 Inhaler   2   . azithromycin (ZITHROMAX) 250 MG tablet      2 tabs PO today, 1 tab PO daily  6 each   0   . FLUoxetine (PROZAC) 20 MG capsule   Oral   Take 20 mg by mouth daily.         . hydrochlorothiazide (HYDRODIURIL) 12.5 MG tablet   Oral   Take 12.5 mg by mouth daily.          Marland Kitchen loratadine (CLARITIN) 10 MG tablet   Oral   Take 10 mg by mouth daily.         . meloxicam (MOBIC) 7.5 MG tablet   Oral   Take 1 tablet (7.5 mg total) by mouth daily.   7 tablet   0   . metFORMIN (GLUCOPHAGE-XR) 500 MG 24 hr tablet   Oral   Take 1,000 mg by mouth daily after supper.           . metroNIDAZOLE (FLAGYL) 500 MG tablet   Oral   Take 1 tablet (500 mg total) by mouth 2 (two) times daily. One po bid x 7 days   14 tablet   0   . Multiple  Vitamins-Minerals (ALIVE WOMENS ENERGY) TABS   Oral   Take 1 tablet by mouth daily.           . ranitidine (ZANTAC) 150 MG tablet   Oral   Take 150 mg by mouth 2 (two) times daily.           . traMADol (ULTRAM) 50 MG tablet   Oral   Take 1 tablet (50 mg total) by mouth every 6 (six) hours as needed for pain.   15 tablet   0   . traMADol (ULTRAM) 50 MG tablet   Oral   Take 1 tablet (50 mg total) by mouth every 6 (six) hours as needed for pain.   15 tablet   0    BP 144/92  Pulse 83  Temp(Src) 98 F (36.7 C) (Oral)  Resp 16  Ht 5' 1.5" (1.562 m)  Wt 250 lb (113.399 kg)  BMI 46.48 kg/m2  SpO2 100%  LMP 04/27/2013 Physical Exam  Nursing note and vitals reviewed. Constitutional: She is oriented to person, place, and time. She appears well-developed and well-nourished.  HENT:  Head: Normocephalic and atraumatic.  Eyes: Pupils are equal, round, and reactive to light.  Neck: Normal range of motion. Neck supple.  Cardiovascular: Normal rate, regular rhythm and normal heart sounds.   Pulmonary/Chest: Effort normal and breath sounds normal. No respiratory distress. She has no wheezes. She has no rales. She exhibits no tenderness.  Abdominal: Soft. Bowel sounds are normal. There is tenderness. There is no rebound and no guarding.  Mild suprapubic tenderness.  Genitourinary:  Positive thick white discharge. No cervical motion tenderness or adnexal tenderness  Musculoskeletal: Normal range of motion. She exhibits no edema.  Lymphadenopathy:    She has no cervical adenopathy.  Neurological: She is alert and oriented to person, place, and time.  Skin: Skin is warm and dry. No rash noted.  Psychiatric: She has a normal mood and affect.    ED Course  Procedures (including critical care time) Medications  HYDROcodone-acetaminophen (NORCO/VICODIN) 5-325 MG per tablet 2 tablet (2 tablets Oral Given 05/11/13 2049)    DIAGNOSTIC STUDIES: Oxygen Saturation is 100% on RA, normal  by my interpretation.    COORDINATION OF CARE: 7:10 PM- Discussed treatment plan with pt which includes UA. Pt agrees to plan.    Labs Review Results for orders placed during the hospital encounter of 05/11/13  WET PREP, GENITAL  Result Value Range   Yeast Wet Prep HPF POC NONE SEEN  NONE SEEN   Trich, Wet Prep NONE SEEN  NONE SEEN   Clue Cells Wet Prep HPF POC MANY (*) NONE SEEN   WBC, Wet Prep HPF POC FEW (*) NONE SEEN  URINALYSIS, ROUTINE W REFLEX MICROSCOPIC      Result Value Range   Color, Urine YELLOW  YELLOW   APPearance CLOUDY (*) CLEAR   Specific Gravity, Urine 1.026  1.005 - 1.030   pH 5.5  5.0 - 8.0   Glucose, UA NEGATIVE  NEGATIVE mg/dL   Hgb urine dipstick NEGATIVE  NEGATIVE   Bilirubin Urine NEGATIVE  NEGATIVE   Ketones, ur NEGATIVE  NEGATIVE mg/dL   Protein, ur NEGATIVE  NEGATIVE mg/dL   Urobilinogen, UA 0.2  0.0 - 1.0 mg/dL   Nitrite NEGATIVE  NEGATIVE   Leukocytes, UA MODERATE (*) NEGATIVE  PREGNANCY, URINE      Result Value Range   Preg Test, Ur NEGATIVE  NEGATIVE  URINE MICROSCOPIC-ADD ON      Result Value Range   Squamous Epithelial / LPF FEW (*) RARE   WBC, UA 7-10  <3 WBC/hpf   Bacteria, UA FEW (*) RARE   No results found.  Imaging Review No results found.   MDM   1. Bacterial vaginal infection    Patient was started on Flagyl. She has suprapubic tenderness but no other significant abdominal or pelvic tenderness which would be suggestive of other etiology such as ovarian torsion or tubo-ovarian abscess. She was discharged and encouraged to followup with her primary care physician or OB/GYN if her symptoms are not improving. She was advised to return here for symptoms worsen.     I personally performed the services described in this documentation, which was scribed in my presence.  The recorded information has been reviewed and considered.    Rolan Bucco, MD 05/11/13 2101

## 2013-05-12 LAB — GC/CHLAMYDIA PROBE AMP: GC Probe RNA: NEGATIVE

## 2013-05-13 LAB — URINE CULTURE: Colony Count: >=100000

## 2013-06-24 ENCOUNTER — Encounter (HOSPITAL_BASED_OUTPATIENT_CLINIC_OR_DEPARTMENT_OTHER): Payer: Self-pay | Admitting: Emergency Medicine

## 2013-06-24 ENCOUNTER — Emergency Department (HOSPITAL_BASED_OUTPATIENT_CLINIC_OR_DEPARTMENT_OTHER)
Admission: EM | Admit: 2013-06-24 | Discharge: 2013-06-24 | Disposition: A | Discharge status: Home / Self Care | Payer: PRIVATE HEALTH INSURANCE | Attending: Emergency Medicine | Admitting: Emergency Medicine

## 2013-06-24 DIAGNOSIS — Z79899 Other long term (current) drug therapy: Secondary | ICD-10-CM | POA: Insufficient documentation

## 2013-06-24 DIAGNOSIS — E282 Polycystic ovarian syndrome: Secondary | ICD-10-CM | POA: Insufficient documentation

## 2013-06-24 DIAGNOSIS — K219 Gastro-esophageal reflux disease without esophagitis: Secondary | ICD-10-CM | POA: Insufficient documentation

## 2013-06-24 DIAGNOSIS — I10 Essential (primary) hypertension: Secondary | ICD-10-CM | POA: Insufficient documentation

## 2013-06-24 DIAGNOSIS — R21 Rash and other nonspecific skin eruption: Secondary | ICD-10-CM

## 2013-06-24 DIAGNOSIS — F3289 Other specified depressive episodes: Secondary | ICD-10-CM | POA: Insufficient documentation

## 2013-06-24 DIAGNOSIS — F329 Major depressive disorder, single episode, unspecified: Secondary | ICD-10-CM | POA: Insufficient documentation

## 2013-06-24 DIAGNOSIS — F411 Generalized anxiety disorder: Secondary | ICD-10-CM | POA: Insufficient documentation

## 2013-06-24 LAB — CBC WITH DIFFERENTIAL/PLATELET
Basophils Absolute: 0 10*3/uL (ref 0.0–0.1)
Basophils Relative: 0 % (ref 0–1)
HCT: 37.7 % (ref 36.0–46.0)
Hemoglobin: 12.2 g/dL (ref 12.0–15.0)
Lymphs Abs: 2.5 10*3/uL (ref 0.7–4.0)
MCH: 22.1 pg — ABNORMAL LOW (ref 26.0–34.0)
MCHC: 32.4 g/dL (ref 30.0–36.0)
MCV: 68.3 fL — ABNORMAL LOW (ref 78.0–100.0)
Monocytes Absolute: 0.7 10*3/uL (ref 0.1–1.0)
Monocytes Relative: 9 % (ref 3–12)
Neutro Abs: 3.9 10*3/uL (ref 1.7–7.7)
Platelets: 278 10*3/uL (ref 150–400)
RDW: 14.9 % (ref 11.5–15.5)
WBC: 7.3 10*3/uL (ref 4.0–10.5)

## 2013-06-24 LAB — BASIC METABOLIC PANEL
BUN: 15 mg/dL (ref 6–23)
Calcium: 9.4 mg/dL (ref 8.4–10.5)
Chloride: 100 mEq/L (ref 96–112)
Creatinine, Ser: 0.8 mg/dL (ref 0.50–1.10)
GFR calc Af Amer: >90 mL/min (ref >90)
Glucose, Bld: 80 mg/dL (ref 70–99)

## 2013-06-24 MED ORDER — HYDROCODONE-ACETAMINOPHEN 5-325 MG PO TABS: 1.0000 | ORAL_TABLET | ORAL | Status: DC | PRN

## 2013-06-24 MED ORDER — DIPHENHYDRAMINE HCL 25 MG PO CAPS
50.0000 mg | ORAL_CAPSULE | Freq: Once | ORAL | Status: AC
  Administered 2013-06-24: 50 mg via ORAL
  Filled 2013-06-24: qty 2

## 2013-06-24 MED ORDER — DIPHENHYDRAMINE HCL 25 MG PO TABS: 25.0000 mg | ORAL_TABLET | Freq: Four times a day (QID) | ORAL | Status: DC

## 2013-06-24 NOTE — ED Provider Notes (Signed)
CSN: [id number]     Arrival date & time 06/24/13  1412 History   First MD Initiated Contact with Patient 06/24/13 1429     Chief Complaint  Patient presents with  . Rash   (Consider location/radiation/quality/duration/timing/severity/associated sxs/prior Treatment) Patient is a 27 y.o. female presenting with rash. The history is provided by the patient. No language interpreter was used.  Rash Location:  Leg Leg rash location:  L leg and R leg Quality: itchiness, painful and redness   Pain details:    Quality:  Pressure Severity:  Mild   Past Medical History  Diagnosis Date  . Hypertension   . Depression   . Anxiety   . GERD (gastroesophageal reflux disease)   . Polycystic ovarian disease   . Seasonal allergies    Past Surgical History  Procedure Laterality Date  . Tonsillectomy     No family history on file. History  Substance Use Topics  . Smoking status: Never Smoker   . Smokeless tobacco: Not on file  . Alcohol Use: Yes     Comment: Occ   OB History   Grav Para Term Preterm Abortions TAB SAB Ect Mult Living                 Review of Systems  Skin: Positive for rash.    Allergies  Review of patient's allergies indicates no known allergies.  Home Medications   Current Outpatient Rx  Name  Route  Sig  Dispense  Refill  . FLUoxetine (PROZAC) 20 MG capsule   Oral   Take 20 mg by mouth daily.         . hydrochlorothiazide (HYDRODIURIL) 12.5 MG tablet   Oral   Take 12.5 mg by mouth daily.          Marland Kitchen loratadine (CLARITIN) 10 MG tablet   Oral   Take 10 mg by mouth daily.         . metFORMIN (GLUCOPHAGE-XR) 500 MG 24 hr tablet   Oral   Take 1,000 mg by mouth daily after supper.           . ranitidine (ZANTAC) 150 MG tablet   Oral   Take 150 mg by mouth 2 (two) times daily.           Marland Kitchen acetaminophen (TYLENOL) 500 MG tablet   Oral   Take 1,000 mg by mouth daily.           . Multiple Vitamins-Minerals (ALIVE WOMENS ENERGY) TABS    Oral   Take 1 tablet by mouth daily.            BP 129/87  Pulse 81  Temp(Src) 98.2 F (36.8 C) (Oral)  Resp 16  Ht 5\' 1"  (1.549 m)  Wt 250 lb (113.399 kg)  BMI 47.26 kg/m2  SpO2 100%  LMP 06/17/2013 Physical Exam  ED Course  Procedures (including critical care time) Labs Review Labs Reviewed  CBC WITH DIFFERENTIAL - Abnormal; Notable for the following:    RBC 5.52 (*)    MCV 68.3 (*)    MCH 22.1 (*)    All other components within normal limits  BASIC METABOLIC PANEL   Results for orders placed during the hospital encounter of 06/24/13  CBC WITH DIFFERENTIAL      Result Value Range   WBC 7.3  4.0 - 10.5 K/uL   RBC 5.52 (*) 3.87 - 5.11 MIL/uL   Hemoglobin 12.2  12.0 - 15.0 g/dL   HCT 37.7  36.0 - 46.0 %   MCV 68.3 (*) 78.0 - 100.0 fL   MCH 22.1 (*) 26.0 - 34.0 pg   MCHC 32.4  30.0 - 36.0 g/dL   RDW 45.4  09.8 - 11.9 %   Platelets 278  150 - 400 K/uL   Neutrophils Relative % 54  43 - 77 %   Lymphocytes Relative 34  12 - 46 %   Monocytes Relative 9  3 - 12 %   Eosinophils Relative 3  0 - 5 %   Basophils Relative 0  0 - 1 %   Neutro Abs 3.9  1.7 - 7.7 K/uL   Lymphs Abs 2.5  0.7 - 4.0 K/uL   Monocytes Absolute 0.7  0.1 - 1.0 K/uL   Eosinophils Absolute 0.2  0.0 - 0.7 K/uL   Basophils Absolute 0.0  0.0 - 0.1 K/uL   RBC Morphology SPHEROCYTES    BASIC METABOLIC PANEL      Result Value Range   Sodium 138  135 - 145 mEq/L   Potassium 4.3  3.5 - 5.1 mEq/L   Chloride 100  96 - 112 mEq/L   CO2 26  19 - 32 mEq/L   Glucose, Bld 80  70 - 99 mg/dL   BUN 15  6 - 23 mg/dL   Creatinine, Ser 1.47  0.50 - 1.10 mg/dL   Calcium 9.4  8.4 - 82.9 mg/dL   GFR calc non Af Amer >90  >90 mL/min   GFR calc Af Amer >90  >90 mL/min    Imaging Review No results found.  EKG Interpretation   None       MDM  No diagnosis found. 1. Nonspecific vasculitis rash  Discussed with Dr. Lawerance Bach (patient's PCP) who advises the patient will have close follow up in the office. Symptomatic  treatment given.     Arnoldo Hooker, PA-C 06/24/13 1649

## 2013-06-24 NOTE — ED Provider Notes (Signed)
Medical screening examination/treatment/procedure(s) were conducted as a shared visit with non-physician practitioner(s) and myself.  I personally evaluated the patient during the encounter.  EKG Interpretation   None       Patient has itching rash on flexor surfaces bilaterally. Mild tenderness, non-blanching. No systemic symptoms. Well-appearing. No concern for cellulitis. Looks like EM, possible vasculitis. Patient's PCP can provide close f/u. Labs here normal except for spherocytes on Diff.  Dagmar Hait, MD 06/24/13 [phone]

## 2013-06-24 NOTE — ED Notes (Signed)
Rash to back of both legs.  Severe itching.  Started Friday.

## 2013-11-28 DIAGNOSIS — E282 Polycystic ovarian syndrome: Secondary | ICD-10-CM | POA: Insufficient documentation

## 2013-11-28 DIAGNOSIS — M1990 Unspecified osteoarthritis, unspecified site: Secondary | ICD-10-CM | POA: Insufficient documentation

## 2013-11-28 DIAGNOSIS — K219 Gastro-esophageal reflux disease without esophagitis: Secondary | ICD-10-CM | POA: Insufficient documentation

## 2013-11-28 DIAGNOSIS — G5600 Carpal tunnel syndrome, unspecified upper limb: Secondary | ICD-10-CM | POA: Insufficient documentation

## 2015-03-08 ENCOUNTER — Emergency Department (HOSPITAL_BASED_OUTPATIENT_CLINIC_OR_DEPARTMENT_OTHER)
Admission: EM | Admit: 2015-03-08 | Discharge: 2015-03-08 | Disposition: A | Discharge status: Home / Self Care | Payer: PRIVATE HEALTH INSURANCE | Attending: Physician Assistant | Admitting: Physician Assistant

## 2015-03-08 ENCOUNTER — Emergency Department (HOSPITAL_BASED_OUTPATIENT_CLINIC_OR_DEPARTMENT_OTHER): Payer: PRIVATE HEALTH INSURANCE

## 2015-03-08 ENCOUNTER — Encounter (HOSPITAL_BASED_OUTPATIENT_CLINIC_OR_DEPARTMENT_OTHER): Payer: Self-pay

## 2015-03-08 DIAGNOSIS — Z793 Long term (current) use of hormonal contraceptives: Secondary | ICD-10-CM | POA: Insufficient documentation

## 2015-03-08 DIAGNOSIS — R059 Cough, unspecified: Secondary | ICD-10-CM

## 2015-03-08 DIAGNOSIS — R0602 Shortness of breath: Secondary | ICD-10-CM | POA: Insufficient documentation

## 2015-03-08 DIAGNOSIS — Z79899 Other long term (current) drug therapy: Secondary | ICD-10-CM | POA: Insufficient documentation

## 2015-03-08 DIAGNOSIS — R0981 Nasal congestion: Secondary | ICD-10-CM | POA: Insufficient documentation

## 2015-03-08 DIAGNOSIS — Z8639 Personal history of other endocrine, nutritional and metabolic disease: Secondary | ICD-10-CM | POA: Insufficient documentation

## 2015-03-08 DIAGNOSIS — I10 Essential (primary) hypertension: Secondary | ICD-10-CM | POA: Insufficient documentation

## 2015-03-08 DIAGNOSIS — R05 Cough: Secondary | ICD-10-CM | POA: Insufficient documentation

## 2015-03-08 DIAGNOSIS — J029 Acute pharyngitis, unspecified: Secondary | ICD-10-CM | POA: Insufficient documentation

## 2015-03-08 DIAGNOSIS — H578 Other specified disorders of eye and adnexa: Secondary | ICD-10-CM | POA: Insufficient documentation

## 2015-03-08 DIAGNOSIS — J3489 Other specified disorders of nose and nasal sinuses: Secondary | ICD-10-CM | POA: Insufficient documentation

## 2015-03-08 DIAGNOSIS — Z8659 Personal history of other mental and behavioral disorders: Secondary | ICD-10-CM | POA: Insufficient documentation

## 2015-03-08 DIAGNOSIS — Z8719 Personal history of other diseases of the digestive system: Secondary | ICD-10-CM | POA: Insufficient documentation

## 2015-03-08 LAB — RAPID STREP SCREEN (MED CTR MEBANE ONLY): Streptococcus, Group A Screen (Direct): NEGATIVE

## 2015-03-08 MED ORDER — IBUPROFEN 800 MG PO TABS
800.0000 mg | ORAL_TABLET | Freq: Once | ORAL | Status: AC
  Administered 2015-03-08: 800 mg via ORAL
  Filled 2015-03-08: qty 1

## 2015-03-08 MED ORDER — GUAIFENESIN-CODEINE 100-10 MG/5ML PO SOLN: 5.0000 mL | Freq: Four times a day (QID) | ORAL | Status: DC | PRN

## 2015-03-08 MED ORDER — BENZONATATE 100 MG PO CAPS: 100.0000 mg | ORAL_CAPSULE | Freq: Three times a day (TID) | ORAL | Status: DC | PRN

## 2015-03-08 NOTE — ED Provider Notes (Signed)
CSN: [id number]     Arrival date & time 03/08/15  4098 History   First MD Initiated Contact with Patient 03/08/15 [phone]     Chief Complaint  Patient presents with  . Cough     (Consider location/radiation/quality/duration/timing/severity/associated sxs/prior Treatment) HPI   Patient is a 29 year old female presenting with viral symptoms. Patient has had a fever earlier this week, right eye with discharge, and now cough. Patient's son had the same thing. Patient reports going to [facility] already in getting eyedrops with antibiotics for her right eye. This was 2 days ago. Now she feels that her cough has started and she would like to get checked out again. Patient been able to eat and drink normally. She has some trouble sleeping due to the cough. Otherwise no fevers recently.  Past Medical History  Diagnosis Date  . Hypertension   . Depression   . Anxiety   . GERD (gastroesophageal reflux disease)   . Polycystic ovarian disease   . Seasonal allergies    Past Surgical History  Procedure Laterality Date  . Tonsillectomy     No family history on file. Social History  Substance Use Topics  . Smoking status: Never Smoker   . Smokeless tobacco: None  . Alcohol Use: Yes     Comment: Occ   OB History    No data available     Review of Systems  Constitutional: Negative for activity change and fatigue.  HENT: Positive for congestion, rhinorrhea and sore throat. Negative for drooling.   Eyes: Positive for discharge and itching.  Respiratory: Positive for cough and shortness of breath. Negative for chest tightness.   Cardiovascular: Negative for chest pain.  Gastrointestinal: Negative for abdominal distention.  Genitourinary: Negative for dysuria and difficulty urinating.  Musculoskeletal: Negative for joint swelling.  Skin: Negative for rash.  Allergic/Immunologic: Negative for immunocompromised state.  Neurological: Negative for seizures.  Psychiatric/Behavioral:  Negative for behavioral problems and agitation.      Allergies  Review of patient's allergies indicates no known allergies.  Home Medications   Prior to Admission medications   Medication Sig Start Date End Date Taking? Authorizing Provider  labetalol (NORMODYNE) 100 MG tablet Take 100 mg by mouth 2 (two) times daily.   Yes Historical Provider, MD  norethindrone (MICRONOR,CAMILA,ERRIN) 0.35 MG tablet Take 1 tablet by mouth daily.   Yes Historical Provider, MD  diphenhydrAMINE (BENADRYL) 25 MG tablet Take 1 tablet (25 mg total) by mouth every 6 (six) hours. 06/24/13   Elpidio Anis, PA-C  loratadine (CLARITIN) 10 MG tablet Take 10 mg by mouth daily.    Historical Provider, MD  metFORMIN (GLUCOPHAGE-XR) 500 MG 24 hr tablet Take 1,000 mg by mouth daily after supper.      Historical Provider, MD  Multiple Vitamins-Minerals (ALIVE WOMENS ENERGY) TABS Take 1 tablet by mouth daily.      Historical Provider, MD   BP 133/88 mmHg  Pulse 106  Temp(Src) 98.6 F (37 C) (Oral)  Resp 20  Ht 5\' 1"  (1.549 m)  Wt 270 lb (122.471 kg)  BMI 51.04 kg/m2  SpO2 96%  LMP 03/05/2015 Physical Exam  Constitutional: She is oriented to person, place, and time. She appears well-developed and well-nourished.  HENT:  Head: Normocephalic and atraumatic.  Mouth/Throat: Oropharynx is clear and moist. No oropharyngeal exudate.  No erythema in turbinates.  Eyes: Conjunctivae are normal. Right eye exhibits no discharge.  Neck: Neck supple.  Cardiovascular: Normal rate, regular rhythm and normal heart sounds.  No murmur heard. Pulmonary/Chest: Effort normal and breath sounds normal. She has no wheezes. She has no rales.  Abdominal: Soft. She exhibits no distension. There is no tenderness.  Musculoskeletal: Normal range of motion. She exhibits no edema.  Neurological: She is oriented to person, place, and time. No cranial nerve deficit.  Skin: Skin is warm and dry. No rash noted. She is not diaphoretic.   Psychiatric: She has a normal mood and affect. Her behavior is normal.  Nursing note and vitals reviewed.   ED Course  Procedures (including critical care time) Labs Review Labs Reviewed  RAPID STREP SCREEN (NOT AT [facility])    Imaging Review Dg Chest 2 View  03/08/2015   CLINICAL DATA:  Chest pain for 4 days with cough and fever.  EXAM: CHEST - 2 VIEW  COMPARISON:  03/01/2015  FINDINGS: The heart size and mediastinal contours are within normal limits. There is no evidence of pulmonary edema, consolidation, pneumothorax, nodule or pleural fluid. The visualized skeletal structures are unremarkable.  IMPRESSION: No active disease.   Electronically Signed   By: Irish Lack M.D.   On: 03/08/2015 09:55   I, Madison Direnzo L Larell Baney, personally reviewed and evaluated these images and lab results as part of my medical decision-making.   EKG Interpretation None      MDM   Final diagnoses:  None   patient is a 29 year old female presented with viral syndrome. Patient's had it for the last 4 days. She was treated with antibiotic eyedrops. Likely this represents a viral syndrome. Her son had exactly the same thing. We will check a chest x-ray to make sure there is no pneumonia. Otherwise we will give her symptomatic care. Do not think she requires antibiotics at this time given that the cough is only been going on for 2 days. We will check for strep throat.  Shaelyn Decarli Randall An, MD 03/08/15 1011

## 2015-03-08 NOTE — ED Notes (Signed)
Patient here with cough and congestion x 4 days, reports that the cough is productive. Taking claritin and no relief. Reports fever with same as high as 101

## 2015-03-08 NOTE — Discharge Instructions (Signed)
Cough, Adult  A cough is a reflex that helps clear your throat and airways. It can help heal the body or may be a reaction to an irritated airway. A cough may only last 2 or 3 weeks (acute) or may last more than 8 weeks (chronic).  CAUSES Acute cough:  Viral or bacterial infections. Chronic cough:  Infections.  Allergies.  Asthma.  Post-nasal drip.  Smoking.  Heartburn or acid reflux.  Some medicines.  Chronic lung problems (COPD).  Cancer. SYMPTOMS   Cough.  Fever.  Chest pain.  Increased breathing rate.  High-pitched whistling sound when breathing (wheezing).  Colored mucus that you cough up (sputum). TREATMENT   A bacterial cough may be treated with antibiotic medicine.  A viral cough must run its course and will not respond to antibiotics.  Your caregiver may recommend other treatments if you have a chronic cough. HOME CARE INSTRUCTIONS   Only take over-the-counter or prescription medicines for pain, discomfort, or fever as directed by your caregiver. Use cough suppressants only as directed by your caregiver.  Use a cold steam vaporizer or humidifier in your bedroom or home to help loosen secretions.  Sleep in a semi-upright position if your cough is worse at night.  Rest as needed.  Stop smoking if you smoke. SEEK IMMEDIATE MEDICAL CARE IF:   You have pus in your sputum.  Your cough starts to worsen.  You cannot control your cough with suppressants and are losing sleep.  You begin coughing up blood.  You have difficulty breathing.  You develop pain which is getting worse or is uncontrolled with medicine.  You have a fever. MAKE SURE YOU:   Understand these instructions.  Will watch your condition.  Will get help right away if you are not doing well or get worse. Document Released: 01/08/2011 Document Revised: 10/04/2011 Document Reviewed: 01/08/2011 Exit[employer] Patient Information 2015 [employer]. This information is not intended  to replace advice given to you by your health care provider. Make sure you discuss any questions you have with your health care provider.  

## 2015-03-10 LAB — CULTURE, GROUP A STREP: STREP A CULTURE: NEGATIVE

## 2015-03-28 ENCOUNTER — Ambulatory Visit (INDEPENDENT_AMBULATORY_CARE_PROVIDER_SITE_OTHER): Payer: PRIVATE HEALTH INSURANCE | Admitting: Family Medicine

## 2015-03-28 ENCOUNTER — Encounter: Payer: Self-pay | Admitting: Family Medicine

## 2015-03-28 ENCOUNTER — Other Ambulatory Visit: Payer: Self-pay

## 2015-03-28 VITALS — BP 131/78 | HR 82 | Temp 98.0°F | Resp 16 | Ht 61.0 in | Wt 266.0 lb

## 2015-03-28 DIAGNOSIS — Z7689 Persons encountering health services in other specified circumstances: Secondary | ICD-10-CM

## 2015-03-28 DIAGNOSIS — Z0000 Encounter for general adult medical examination without abnormal findings: Secondary | ICD-10-CM

## 2015-03-28 DIAGNOSIS — Z7189 Other specified counseling: Secondary | ICD-10-CM

## 2015-03-28 DIAGNOSIS — B354 Tinea corporis: Secondary | ICD-10-CM

## 2015-03-28 LAB — COMPLETE METABOLIC PANEL WITH GFR
ALK PHOS: 75 U/L (ref 33–115)
ALT: 13 U/L (ref 6–29)
AST: 17 U/L (ref 10–30)
Albumin: 3.7 g/dL (ref 3.6–5.1)
BUN: 13 mg/dL (ref 7–25)
CO2: 24 mmol/L (ref 20–31)
Calcium: 9 mg/dL (ref 8.6–10.2)
Chloride: 102 mmol/L (ref 98–110)
Creat: 0.89 mg/dL (ref 0.50–1.10)
GFR, EST NON AFRICAN AMERICAN: 88 mL/min (ref >=60)
Glucose, Bld: 77 mg/dL (ref 65–99)
Potassium: 4.5 mmol/L (ref 3.5–5.3)
Sodium: 142 mmol/L (ref 135–146)
Total Bilirubin: 0.7 mg/dL (ref 0.2–1.2)
Total Protein: 6.5 g/dL (ref 6.1–8.1)

## 2015-03-28 LAB — CBC WITH DIFFERENTIAL/PLATELET
BASOS PCT: 0 % (ref 0–1)
Basophils Absolute: 0 10*3/uL (ref 0.0–0.1)
Eosinophils Absolute: 0.1 10*3/uL (ref 0.0–0.7)
Eosinophils Relative: 2 % (ref 0–5)
HCT: 38.5 % (ref 36.0–46.0)
Hemoglobin: 12.2 g/dL (ref 12.0–15.0)
Lymphocytes Relative: 33 % (ref 12–46)
Lymphs Abs: 2.1 10*3/uL (ref 0.7–4.0)
MCH: 22.7 pg — ABNORMAL LOW (ref 26.0–34.0)
MCHC: 31.7 g/dL (ref 30.0–36.0)
MCV: 71.7 fL — ABNORMAL LOW (ref 78.0–100.0)
MONO ABS: 0.8 10*3/uL (ref 0.1–1.0)
MPV: 9.9 fL (ref 8.6–12.4)
Monocytes Relative: 12 % (ref 3–12)
NEUTROS ABS: 3.4 10*3/uL (ref 1.7–7.7)
NEUTROS PCT: 53 % (ref 43–77)
Platelets: 258 10*3/uL (ref 150–400)
RBC: 5.37 MIL/uL — AB (ref 3.87–5.11)
RDW: 15.3 % (ref 11.5–15.5)
WBC: 6.5 10*3/uL (ref 4.0–10.5)

## 2015-03-28 LAB — TSH: TSH: 0.754 u[IU]/mL (ref 0.350–4.500)

## 2015-03-28 MED ORDER — LABETALOL HCL 100 MG PO TABS: 100.0000 mg | ORAL_TABLET | Freq: Two times a day (BID) | ORAL | Status: DC

## 2015-03-28 MED ORDER — METFORMIN HCL ER 500 MG PO TB24: 1000.0000 mg | ORAL_TABLET | Freq: Every day | ORAL | Status: DC

## 2015-03-28 NOTE — Progress Notes (Signed)
Patient ID: Natasha Rice, female   DOB: October 17, 1985, 29 y.o.   MRN: [id number] [email or web address]  Subjective:  Patient ID: Natasha Rice, female    DOB: [birth year]  Age: 29 y.o. MRN: [id number]  CC: Establish Care   HPI Natasha Rice presents to establish care. She has a history of hypertension, seasonal allergies, GERD, and PCOS. Her major complaint today however is a irritated rash under her breasts bilaterally.. Her current medications are labetalol 100 twice a day for blood pressure and metformin 500 Pixar 2 tablets a day for her PCO S she is currently not having any problem with her allergies and is not taking her loratadine. She is also on birth control pills per her GYN. She has experienced some depression and anxiety in the past but does not currently feel that she has enough depression or anxiety to require treatment  Past Medical History  Diagnosis Date  . Hypertension   . Depression   . Anxiety   . GERD (gastroesophageal reflux disease)   . Polycystic ovarian disease   . Seasonal allergies     Past Surgical History  Procedure Laterality Date  . Tonsillectomy    . Cesarean section      Family History  Problem Relation Age of Onset  . Diabetes Father   . Hypertension Father   . Hyperlipidemia Father   . Hypertension Maternal Grandmother     Social History  Substance Use Topics  . Smoking status: Former Games developer  . Smokeless tobacco: Not on file  . Alcohol Use: Yes     Comment: Occ    ROS Review of Systems    Constitutional: Negative for fever, chills, weight loss,fatigue. Skin: Positive for rash under breast bilaterally. HENT: Negative for ear pain, ear discharge.nose bleeds.  Eyes: Negative for pain, discharge, redness, itching and visual disturbance.  Neck: Negative for pain, stiffness Respiratory: Negative for cough, shortness of breath,  Cardiovascular: Negative for chest pain, palpitations and leg swelling. Gastrointestinal:  Negative for abdominal  pain  nausea, vomiting, diarrhea. Positive for constipation Genitourinary: Negative for dysuria, urgency, frequency, hematuria,  Musculoskeletal: Negative for back pain, joint pain, joint swelling, and gait problem.Negative for weakness. Neurological: Negative for dizziness, tremors, seizures, syncope, light-headedness, numbness and headaches.  Hematological: Negative for easy bruising or bleeding Psychiatric/Behavioral: Positive for depression, anxiety. Negative for decreased concentration, confusion   Objective:   Today's Vitals: BP 131/78 mmHg  Pulse 82  Temp(Src) 98 F (36.7 C) (Oral)  Resp 16  Ht 5\' 1"  (1.549 m)  Wt 266 lb (120.657 kg)  BMI 50.29 kg/m2  LMP 03/05/2015   Physical Exam  Constitutional: Patient appears well-developed and well-nourished. No distress. HENT: Normocephalic, atraumatic, External right and left ear normal. Oropharynx is clear and moist.  Eyes: Conjunctivae and EOM are normal. PERRLA, no scleral icterus. Neck: Normal ROM. Neck supple. No lymphadenopathy, No thyromegaly. CVS: RRR, S1/S2 +, no murmurs, no gallops, no rubs Pulmonary: Effort and breath sounds normal, no stridor, rhonchi, wheezes, rales.  Abdominal: Soft. Normoactive BS,, no distension, tenderness, rebound or guarding.  Musculoskeletal: Normal range of motion. No edema and no tenderness.  Neuro: Alert.Normal muscle tone coordination. Non-focal Skin: Skin is warm and dry. No rash noted. Not diaphoretic. No erythema. No pallor. Psychiatric: Normal mood and affect. Behavior, judgment, thought content normal.        Outpatient Encounter Prescriptions as of 03/28/2015  Medication Sig  . loratadine (CLARITIN) 10 MG tablet Take 10 mg by mouth daily.  . norethindrone (  MICRONOR,CAMILA,ERRIN) 0.35 MG tablet Take 1 tablet by mouth daily.  . [DISCONTINUED] labetalol (NORMODYNE) 100 MG tablet Take 100 mg by mouth 2 (two) times daily.  . [DISCONTINUED] labetalol (NORMODYNE) 100 MG tablet Take  1 tablet (100 mg total) by mouth 2 (two) times daily.  . benzonatate (TESSALON PERLES) 100 MG capsule Take 1 capsule (100 mg total) by mouth 3 (three) times daily as needed for cough. (Patient not taking: Reported on 03/28/2015)  . diphenhydrAMINE (BENADRYL) 25 MG tablet Take 1 tablet (25 mg total) by mouth every 6 (six) hours. (Patient not taking: Reported on 03/28/2015)  . guaiFENesin-codeine 100-10 MG/5ML syrup Take 5 mLs by mouth every 6 (six) hours as needed for cough. (Patient not taking: Reported on 03/28/2015)  . Multiple Vitamins-Minerals (ALIVE WOMENS ENERGY) TABS Take 1 tablet by mouth daily.    . [DISCONTINUED] metFORMIN (GLUCOPHAGE-XR) 500 MG 24 hr tablet Take 1,000 mg by mouth daily after supper.    . [DISCONTINUED] metFORMIN (GLUCOPHAGE-XR) 500 MG 24 hr tablet Take 2 tablets (1,000 mg total) by mouth daily after supper.   No facility-administered encounter medications on file as of 03/28/2015.    Assessment and Plan:  1.Visit to establish care -I have review information provided by the patient - have review health maintenance items and nothing is due at this time. -Cmet with GFR -CBC -TSH -Vitamin D.  2. Tinea Corporis -have recommended OTC antifungal powder to be applied bid after making sure the area is well-dried.    Follow-up: Return in about 3 months (around 06/27/2015).    Henrietta Hoover FNP

## 2015-04-01 LAB — VITAMIN D 1,25 DIHYDROXY
VITAMIN D 1, 25 (OH) TOTAL: 82 pg/mL — AB (ref 18–72)
Vitamin D2 1, 25 (OH)2: <8 pg/mL
Vitamin D3 1, 25 (OH)2: 82 pg/mL

## 2015-04-02 ENCOUNTER — Telehealth: Payer: Self-pay

## 2015-04-02 NOTE — Telephone Encounter (Signed)
-----   Message from Henrietta Hoover, NP sent at 04/01/2015  5:32 PM EDT ----- Vitamin D level is high.I would not recommend a vitamin supplement with D. Otherwise labs ok.

## 2015-04-02 NOTE — Telephone Encounter (Signed)
Spoke with patient and advised of high vitamin D level and to not take any otc supplements with vitamin D in them. Patient verbalized understanding. Thanks!

## 2015-04-03 ENCOUNTER — Telehealth: Payer: Self-pay

## 2015-04-03 NOTE — Telephone Encounter (Signed)
Patient called back today with questions about the vitamin D level being elevated. She states she does not take any supplement with vitamin D in it, and does not drink milk. She wants to know what else she could do to get this level down, and what may be the cause of it? Please advise. Thanks!

## 2015-04-03 NOTE — Telephone Encounter (Signed)
Called and advised patient of Linda's instructions. Thanks!

## 2015-04-03 NOTE — Telephone Encounter (Signed)
It is nothing to worry about. I just wanted you to know that you do not need a supplement.  We get Vitamin D from the sun, from fish, especially tuna, mushrooms, orange juice, fortified cereals, egg yolks, Krill oil, beef liver, milk and other foods.

## 2015-06-26 ENCOUNTER — Ambulatory Visit: Payer: PRIVATE HEALTH INSURANCE | Admitting: Family Medicine

## 2015-06-27 ENCOUNTER — Ambulatory Visit: Payer: PRIVATE HEALTH INSURANCE | Admitting: Family Medicine

## 2015-08-15 ENCOUNTER — Emergency Department (HOSPITAL_BASED_OUTPATIENT_CLINIC_OR_DEPARTMENT_OTHER)
Admission: EM | Admit: 2015-08-15 | Discharge: 2015-08-16 | Disposition: A | Discharge status: Home / Self Care | Payer: PRIVATE HEALTH INSURANCE | Attending: Emergency Medicine | Admitting: Emergency Medicine

## 2015-08-15 ENCOUNTER — Encounter (HOSPITAL_BASED_OUTPATIENT_CLINIC_OR_DEPARTMENT_OTHER): Payer: Self-pay | Admitting: Emergency Medicine

## 2015-08-15 DIAGNOSIS — I10 Essential (primary) hypertension: Secondary | ICD-10-CM | POA: Insufficient documentation

## 2015-08-15 DIAGNOSIS — Z8719 Personal history of other diseases of the digestive system: Secondary | ICD-10-CM | POA: Insufficient documentation

## 2015-08-15 DIAGNOSIS — L02411 Cutaneous abscess of right axilla: Secondary | ICD-10-CM | POA: Insufficient documentation

## 2015-08-15 DIAGNOSIS — Z79899 Other long term (current) drug therapy: Secondary | ICD-10-CM | POA: Insufficient documentation

## 2015-08-15 DIAGNOSIS — Z87891 Personal history of nicotine dependence: Secondary | ICD-10-CM | POA: Insufficient documentation

## 2015-08-15 DIAGNOSIS — Z8742 Personal history of other diseases of the female genital tract: Secondary | ICD-10-CM | POA: Insufficient documentation

## 2015-08-15 DIAGNOSIS — Z8659 Personal history of other mental and behavioral disorders: Secondary | ICD-10-CM | POA: Insufficient documentation

## 2015-08-15 NOTE — ED Notes (Signed)
Patient has a boil under her right arm that she has had for 2 weeks

## 2015-08-16 MED ORDER — HYDROCODONE-ACETAMINOPHEN 5-325 MG PO TABS: 2.0000 | ORAL_TABLET | ORAL | Status: DC | PRN

## 2015-08-16 MED ORDER — LIDOCAINE-EPINEPHRINE 2 %-1:100000 IJ SOLN
20.0000 mL | Freq: Once | INTRAMUSCULAR | Status: AC
  Administered 2015-08-16: 20 mL via INTRADERMAL
  Filled 2015-08-16: qty 1

## 2015-08-16 MED ORDER — HYDROCODONE-ACETAMINOPHEN 5-325 MG PO TABS
1.0000 | ORAL_TABLET | Freq: Once | ORAL | Status: AC
  Administered 2015-08-16: 1 via ORAL
  Filled 2015-08-16: qty 1

## 2015-08-16 NOTE — ED Notes (Signed)
MD at bedside. 

## 2015-08-16 NOTE — Discharge Instructions (Signed)
Incision and Drainage Incision and drainage is a procedure in which a sac-like structure (cystic structure) is opened and drained. The area to be drained usually contains material such as pus, fluid, or blood.  LET YOUR CAREGIVER KNOW ABOUT:   Allergies to medicine.  Medicines taken, including vitamins, herbs, eyedrops, over-the-counter medicines, and creams.  Use of steroids (by mouth or creams).  Previous problems with anesthetics or numbing medicines.  History of bleeding problems or blood clots.  Previous surgery.  Other health problems, including diabetes and kidney problems.  Possibility of pregnancy, if this applies. RISKS AND COMPLICATIONS  Pain.  Bleeding.  Scarring.  Infection. BEFORE THE PROCEDURE  You may need to have an ultrasound or other imaging tests to see how large or deep your cystic structure is. Blood tests may also be used to determine if you have an infection or how severe the infection is. You may need to have a tetanus shot. PROCEDURE  The affected area is cleaned with a cleaning fluid. The cyst area will then be numbed with a medicine (local anesthetic). A small incision will be made in the cystic structure. A syringe or catheter may be used to drain the contents of the cystic structure, or the contents may be squeezed out. The area will then be flushed with a cleansing solution. After cleansing the area, it is often gently packed with a gauze or another wound dressing. Once it is packed, it will be covered with gauze and tape or some other type of wound dressing. AFTER THE PROCEDURE   Often, you will be allowed to go home right after the procedure.  You may be given antibiotic medicine to prevent or heal an infection.  If the area was packed with gauze or some other wound dressing, you will likely need to come back in 1 to 2 days to get it removed.  The area should heal in about 14 days.   This information is not intended to replace advice given  to you by your health care provider. Make sure you discuss any questions you have with your health care provider.   Document Released: 01/05/2001 Document Revised: 01/11/2012 Document Reviewed: 09/06/2011 Elsevier Interactive Patient Education 2016 [employer] Inc[employer]  

## 2015-08-16 NOTE — ED Provider Notes (Addendum)
CSN: [id number]     Arrival date & time 08/15/15  2301 History   First MD Initiated Contact with Patient 08/16/15 0059     Chief Complaint  Patient presents with  . Abscess     (Consider location/radiation/quality/duration/timing/severity/associated sxs/prior Treatment) HPI  This is a 30 year old female with a 1-1/2 week history of a swollen, tender mass in her right axilla. There is associated erythema. She states it is "very painful", worse with movement or palpation. She has been placing hot compresses without relief. She has not had fever or chills. The lesion is not draining.  Past Medical History  Diagnosis Date  . Hypertension   . Depression   . Anxiety   . GERD (gastroesophageal reflux disease)   . Polycystic ovarian disease   . Seasonal allergies    Past Surgical History  Procedure Laterality Date  . Tonsillectomy    . Cesarean section     Family History  Problem Relation Age of Onset  . Diabetes Father   . Hypertension Father   . Hyperlipidemia Father   . Hypertension Maternal Grandmother    Social History  Substance Use Topics  . Smoking status: Former Games developer  . Smokeless tobacco: None  . Alcohol Use: Yes     Comment: Occ   OB History    No data available     Review of Systems  All other systems reviewed and are negative.   Allergies  Review of patient's allergies indicates no known allergies.  Home Medications   Prior to Admission medications   Medication Sig Start Date End Date Taking? Authorizing Provider  benzonatate (TESSALON PERLES) 100 MG capsule Take 1 capsule (100 mg total) by mouth 3 (three) times daily as needed for cough. Patient not taking: Reported on 03/28/2015 03/08/15   Courteney Lyn Mackuen, MD  diphenhydrAMINE (BENADRYL) 25 MG tablet Take 1 tablet (25 mg total) by mouth every 6 (six) hours. Patient not taking: Reported on 03/28/2015 06/24/13   Elpidio Anis, PA-C  guaiFENesin-codeine 100-10 MG/5ML syrup Take 5 mLs by mouth every 6  (six) hours as needed for cough. Patient not taking: Reported on 03/28/2015 03/08/15   Courteney Lyn Mackuen, MD  labetalol (NORMODYNE) 100 MG tablet Take 1 tablet (100 mg total) by mouth 2 (two) times daily. 03/28/15   Henrietta Hoover, NP  loratadine (CLARITIN) 10 MG tablet Take 10 mg by mouth daily.    Historical Provider, MD  metFORMIN (GLUCOPHAGE-XR) 500 MG 24 hr tablet Take 2 tablets (1,000 mg total) by mouth daily after supper. 03/28/15   Henrietta Hoover, NP  Multiple Vitamins-Minerals (ALIVE WOMENS ENERGY) TABS Take 1 tablet by mouth daily.      Historical Provider, MD  norethindrone (MICRONOR,CAMILA,ERRIN) 0.35 MG tablet Take 1 tablet by mouth daily.    Historical Provider, MD   BP 146/103 mmHg  Pulse 75  Temp(Src) 98.5 F (36.9 C) (Oral)  Resp 18  Ht  (1.549 m)  Wt 266 lb (120.657 kg)  BMI 50.29 kg/m2  SpO2 100%  LMP 08/15/2015   Physical Exam  General: Well-developed, obese female in no acute distress; appearance consistent with age of record HENT: normocephalic; atraumatic Eyes: pupils equal, round and reactive to light; extraocular muscles intact Neck: supple Heart: regular rate and rhythm Lungs: clear to auscultation bilaterally Abdomen: soft; nondistended; nontender; bowel sounds present Extremities: No deformity; full range of motion; pulses normal Neurologic: Awake, alert and oriented; motor function intact in all extremities and symmetric; no facial droop Skin:  Warm and dry; abscess right axilla Psychiatric: Normal mood and affect    ED Course  Procedures (including critical care time)  INCISION AND DRAINAGE Performed by: Hanley Seamen Consent: Verbal consent obtained. Risks and benefits: risks, benefits and alternatives were discussed Type: abscess  Body area: Right axilla  Anesthesia: local infiltration  Incision was made with a scalpel.  Local anesthetic: lidocaine 2 % with epinephrine  Anesthetic total: 3 ml  Complexity: complex Blunt  dissection to break up loculations  Drainage: purulent  Drainage amount: Moderate   Packing material: 1/4 in iodoform gauze  Patient tolerance: Patient tolerated the procedure well with no immediate complications.     MDM  The patient was advised to remove the packing in 3 days or to return if symptoms are worsening.   Paula Libra, MD 08/16/15 1610  Paula Libra, MD 08/16/15 [id number]

## 2015-09-18 MED FILL — LABETALOL HCL 100 MG TABLET: 100 | 30 days supply | Qty: 60 | Fill #1 | Status: TO

## 2015-11-21 MED FILL — LABETALOL HCL 100 MG TABLET: 100 | 30 days supply | Qty: 60 | Fill #2 | Status: TO

## 2017-05-02 IMAGING — DX DG CHEST 2V
2 series · 2 of 2 positions shown · non-contrast
Comparison: 03/01/2015

CLINICAL DATA: Chest pain for 4 days with cough and fever.

EXAM:
CHEST - 2 VIEW

[chest pa]
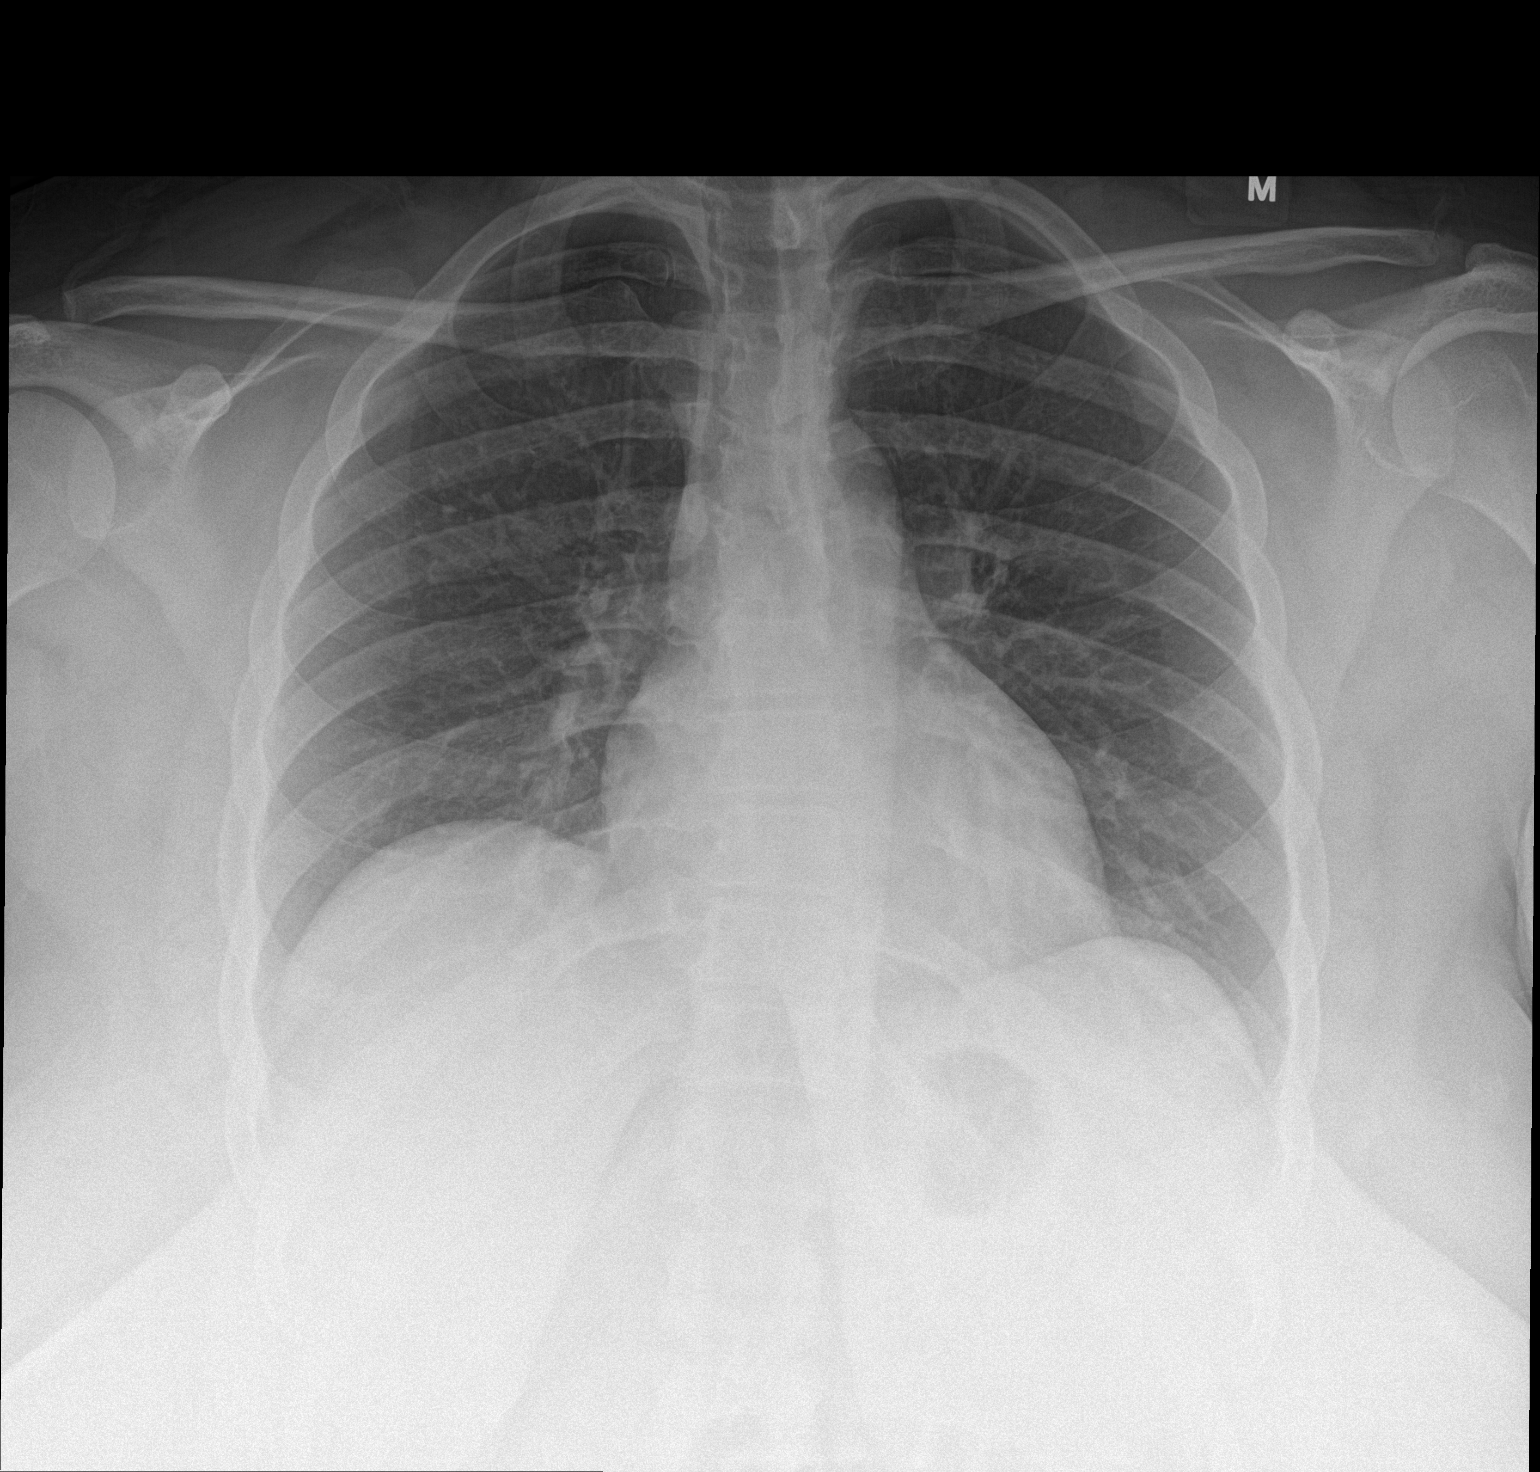

[chest lat]
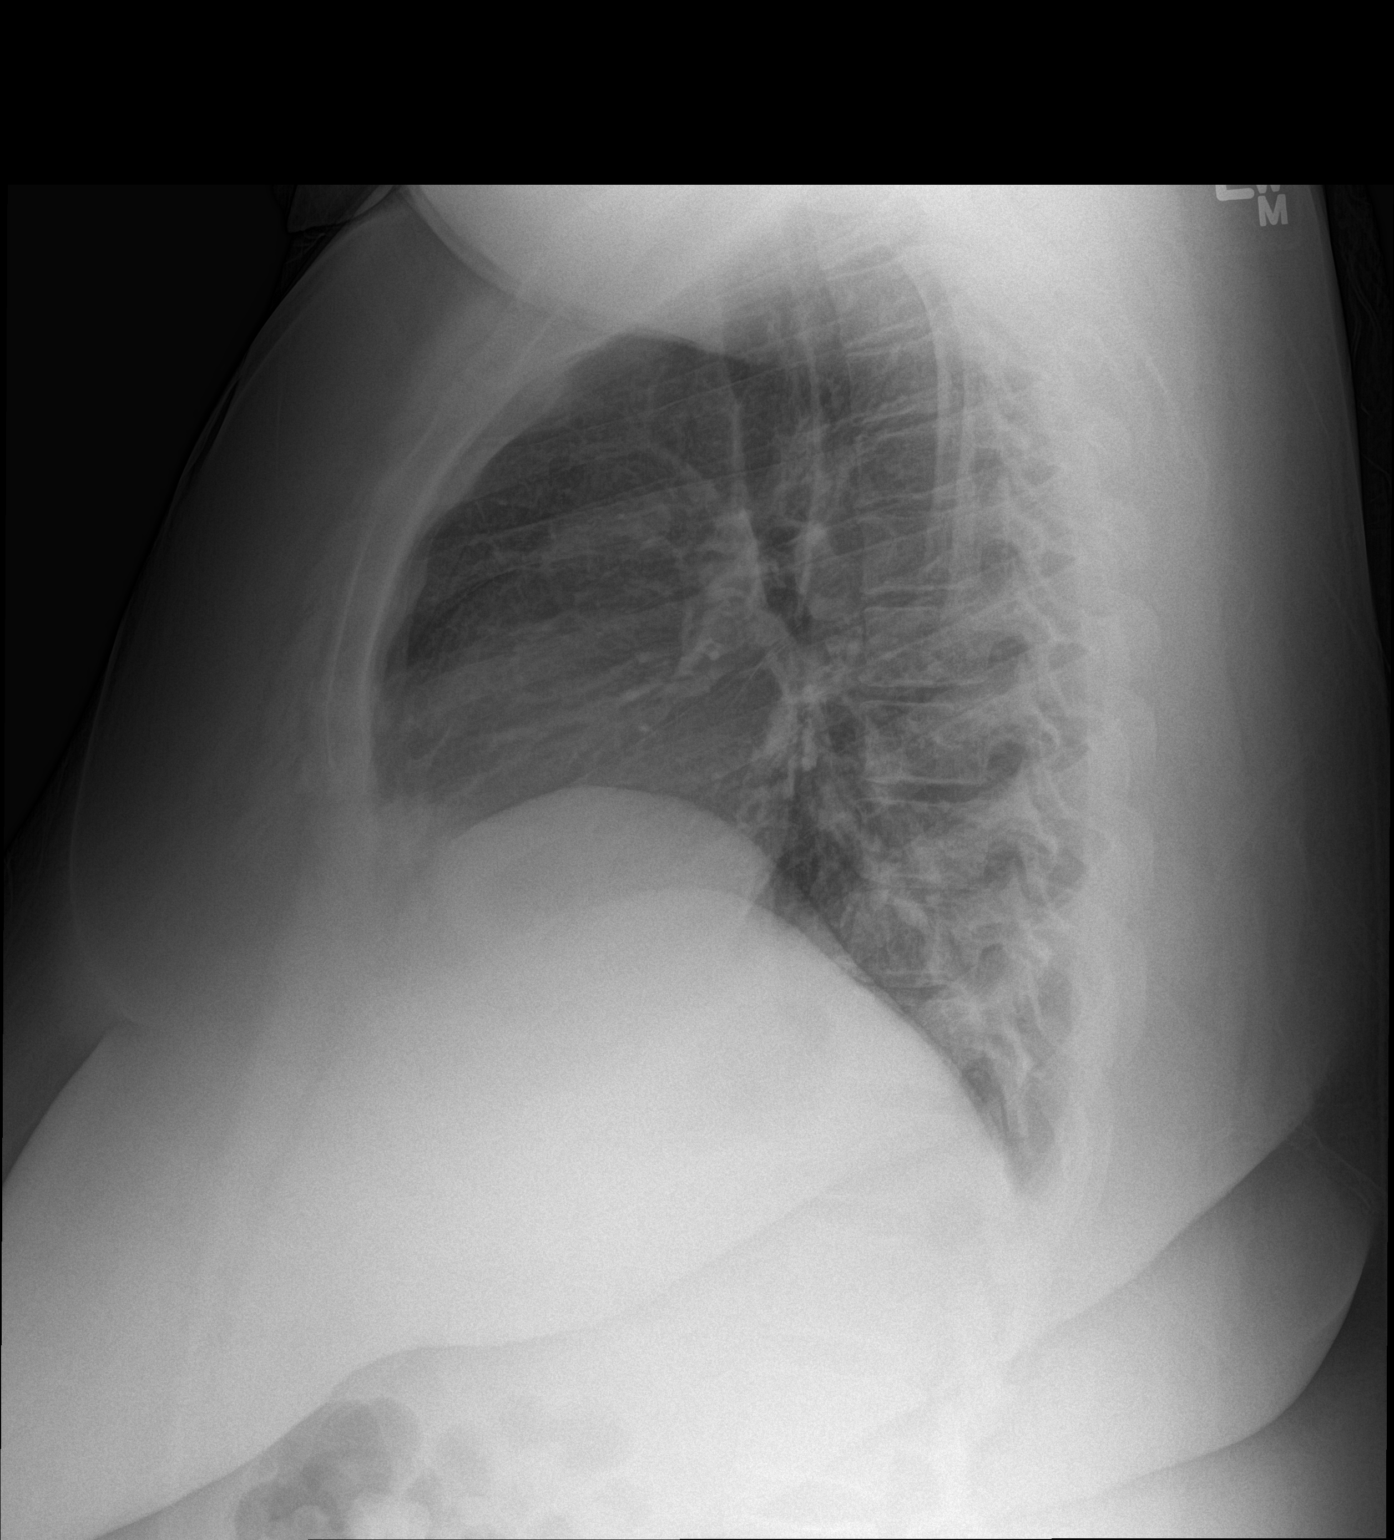

[2 of 2 positions shown; findings below may reference images not displayed]

FINDINGS: The heart size and mediastinal contours are within normal limits.
There is no evidence of pulmonary edema, consolidation,
pneumothorax, nodule or pleural fluid. The visualized skeletal
structures are unremarkable.
IMPRESSION: No active disease.

## 2017-12-08 DIAGNOSIS — O09292 Supervision of pregnancy with other poor reproductive or obstetric history, second trimester: Secondary | ICD-10-CM | POA: Insufficient documentation

## 2019-04-05 DIAGNOSIS — E669 Obesity, unspecified: Secondary | ICD-10-CM | POA: Diagnosis not present

## 2019-04-05 DIAGNOSIS — Z6834 Body mass index (BMI) 34.0-34.9, adult: Secondary | ICD-10-CM | POA: Diagnosis not present

## 2019-04-05 DIAGNOSIS — K5904 Chronic idiopathic constipation: Secondary | ICD-10-CM | POA: Diagnosis not present

## 2019-04-05 DIAGNOSIS — K219 Gastro-esophageal reflux disease without esophagitis: Secondary | ICD-10-CM | POA: Diagnosis not present

## 2019-04-05 MED FILL — VIT D2 1.25 MG (50,000 UNIT: 1.25 MG | 28 days supply | Qty: 4 | Fill #0

## 2019-04-05 MED FILL — PRENATAL FE 27-1 MG TAB: 27-1 | 90 days supply | Qty: 90 | Fill #0

## 2019-04-05 MED FILL — ETONOGESTREL-ETHINYL ESTRAD: 0.12-0.015 | 84 days supply | Qty: 3 | Fill #0

## 2019-05-02 DIAGNOSIS — A599 Trichomoniasis, unspecified: Secondary | ICD-10-CM | POA: Diagnosis not present

## 2019-05-02 MED FILL — metroNIDAZOLE 500 MG TABS: 500 | 7 days supply | Qty: 14 | Fill #0

## 2019-05-17 ENCOUNTER — Telehealth: Payer: 59 | Admitting: Physician Assistant

## 2019-05-17 DIAGNOSIS — J209 Acute bronchitis, unspecified: Secondary | ICD-10-CM | POA: Diagnosis not present

## 2019-05-17 MED ORDER — PREDNISONE 10 MG (21) PO TBPK: ORAL_TABLET | ORAL | 0 refills | Status: DC

## 2019-05-17 MED ORDER — AZITHROMYCIN 250 MG PO TABS: ORAL_TABLET | ORAL | 0 refills | Status: DC

## 2019-05-17 MED ORDER — BENZONATATE 100 MG PO CAPS: 100.0000 mg | ORAL_CAPSULE | Freq: Three times a day (TID) | ORAL | 0 refills | Status: DC | PRN

## 2019-05-17 MED FILL — BENZONATATE 100 MG CAPS: 100 | 7 days supply | Qty: 40 | Fill #0

## 2019-05-17 MED FILL — AZITHROMYCIN 250 MG TABLET: 250 | 5 days supply | Qty: 6 | Fill #0

## 2019-05-17 MED FILL — VIT D2 1.25 MG (50,000 UNIT: 1.25 MG | 28 days supply | Qty: 4 | Fill #1

## 2019-05-17 MED FILL — predniSONE 10 MG TABS: 10 | 6 days supply | Qty: 21 | Fill #0

## 2019-05-17 NOTE — Progress Notes (Signed)
We are sorry that you are not feeling well.  Here is how we plan to help!  Based on your presentation I believe you most likely have A cough due to bacteria.  When patients have a fever and a productive cough with a change in color or increased sputum production, we are concerned about bacterial bronchitis.  If left untreated it can progress to pneumonia.  If your symptoms do not improve with your treatment plan it is important that you contact your provider.   I have prescribed Azithromyin 250 mg: two tablets now and then one tablet daily for 4 additonal days    In addition you may use A prescription cough medication called Tessalon Perles 124m. You may take 1-2 capsules every 8 hours as needed for your cough.  Prednisone 5 mg daily for 6 days (see taper instructions below)  From your responses in the eVisit questionnaire you describe inflammation in the upper respiratory tract which is causing a significant cough.  This is commonly called Bronchitis and has four common causes:    Allergies  Viral Infections  Acid Reflux  Bacterial Infection Allergies, viruses and acid reflux are treated by controlling symptoms or eliminating the cause. An example might be a cough caused by taking certain blood pressure medications. You stop the cough by changing the medication. Another example might be a cough caused by acid reflux. Controlling the reflux helps control the cough.  USE OF BRONCHODILATOR ("RESCUE") INHALERS: There is a risk from using your bronchodilator too frequently.  The risk is that over-reliance on a medication which only relaxes the muscles surrounding the breathing tubes can reduce the effectiveness of medications prescribed to reduce swelling and congestion of the tubes themselves.  Although you feel brief relief from the bronchodilator inhaler, your asthma may actually be worsening with the tubes becoming more swollen and filled with mucus.  This can delay other crucial treatments,  such as oral steroid medications. If you need to use a bronchodilator inhaler daily, several times per day, you should discuss this with your provider.  There are probably better treatments that could be used to keep your asthma under control.     HOME CARE . Only take medications as instructed by your medical team. . Complete the entire course of an antibiotic. . Drink plenty of fluids and get plenty of rest. . Avoid close contacts especially the very young and the elderly . Cover your mouth if you cough or cough into your sleeve. . Always remember to wash your hands . A steam or ultrasonic humidifier can help congestion.   GET HELP RIGHT AWAY IF: . You develop worsening fever. . You become short of breath . You cough up blood. . Your symptoms persist after you have completed your treatment plan MAKE SURE YOU   Understand these instructions.  Will watch your condition.  Will get help right away if you are not doing well or get worse.  Your e-visit answers were reviewed by a board certified advanced clinical practitioner to complete your personal care plan.  Depending on the condition, your plan could have included both over the counter or prescription medications. If there is a problem please reply  once you have received a response from your provider. Your safety is important to [place]  If you have drug allergies check your prescription carefully.    You can use MyChart to ask questions about today's visit, request a non-urgent call back, or ask for a work or school excuse  for 24 hours related to this e-Visit. If it has been greater than 24 hours you will need to follow up with your provider, or enter a new e-Visit to address those concerns. You will get an e-mail in the next two days asking about your experience.  I hope that your e-visit has been valuable and will speed your recovery. Thank you for using e-visits.  Particia Nearing PA-C  Approximately 5 minutes was spent documenting and  reviewing patient's chart.

## 2019-05-22 DIAGNOSIS — R102 Pelvic and perineal pain: Secondary | ICD-10-CM | POA: Diagnosis not present

## 2019-05-22 DIAGNOSIS — Z113 Encounter for screening for infections with a predominantly sexual mode of transmission: Secondary | ICD-10-CM | POA: Diagnosis not present

## 2019-05-22 DIAGNOSIS — Z8619 Personal history of other infectious and parasitic diseases: Secondary | ICD-10-CM | POA: Diagnosis not present

## 2019-06-05 MED FILL — LINZESS 290 MCG CAPSULE: 290 | 90 days supply | Qty: 90 | Fill #0

## 2019-06-25 MED FILL — ETONOGESTREL-ETHINYL ESTRAD: 0.12-0.015 | 84 days supply | Qty: 3 | Fill #1

## 2019-06-25 MED FILL — VIT D2 1.25 MG (50,000 UNIT: 1.25 MG | 28 days supply | Qty: 4 | Fill #2

## 2019-06-29 MED FILL — BUTALB-ACETAMIN-CAFF 50-325: 50-325-40 | 7 days supply | Qty: 30 | Fill #0

## 2019-07-30 DIAGNOSIS — K5904 Chronic idiopathic constipation: Secondary | ICD-10-CM | POA: Diagnosis not present

## 2019-07-30 DIAGNOSIS — K912 Postsurgical malabsorption, not elsewhere classified: Secondary | ICD-10-CM | POA: Diagnosis not present

## 2019-07-30 DIAGNOSIS — Z903 Acquired absence of stomach [part of]: Secondary | ICD-10-CM | POA: Diagnosis not present

## 2019-07-30 DIAGNOSIS — E669 Obesity, unspecified: Secondary | ICD-10-CM | POA: Diagnosis not present

## 2019-07-30 DIAGNOSIS — Z6835 Body mass index (BMI) 35.0-35.9, adult: Secondary | ICD-10-CM | POA: Diagnosis not present

## 2019-08-20 MED FILL — VIT D2 1.25 MG (50,000 UNIT: 1.25 MG | 28 days supply | Qty: 4 | Fill #0

## 2019-08-23 DIAGNOSIS — Z9989 Dependence on other enabling machines and devices: Secondary | ICD-10-CM | POA: Diagnosis not present

## 2019-08-23 DIAGNOSIS — G4733 Obstructive sleep apnea (adult) (pediatric): Secondary | ICD-10-CM | POA: Diagnosis not present

## 2019-08-23 DIAGNOSIS — K5904 Chronic idiopathic constipation: Secondary | ICD-10-CM | POA: Diagnosis not present

## 2019-08-23 DIAGNOSIS — Z903 Acquired absence of stomach [part of]: Secondary | ICD-10-CM | POA: Diagnosis not present

## 2019-08-23 DIAGNOSIS — K219 Gastro-esophageal reflux disease without esophagitis: Secondary | ICD-10-CM | POA: Diagnosis not present

## 2019-08-23 DIAGNOSIS — K912 Postsurgical malabsorption, not elsewhere classified: Secondary | ICD-10-CM | POA: Diagnosis not present

## 2019-08-23 DIAGNOSIS — I10 Essential (primary) hypertension: Secondary | ICD-10-CM | POA: Diagnosis not present

## 2019-09-03 MED FILL — LINZESS 290 MCG CAPSULE: 290 | 90 days supply | Qty: 90 | Fill #1

## 2019-09-28 MED FILL — ETONOGESTREL-ETHINYL ESTRAD: 0.12-0.015 | 84 days supply | Qty: 3 | Fill #2

## 2019-10-04 DIAGNOSIS — Z6833 Body mass index (BMI) 33.0-33.9, adult: Secondary | ICD-10-CM | POA: Diagnosis not present

## 2019-10-04 DIAGNOSIS — K5904 Chronic idiopathic constipation: Secondary | ICD-10-CM | POA: Diagnosis not present

## 2019-10-04 DIAGNOSIS — Z903 Acquired absence of stomach [part of]: Secondary | ICD-10-CM | POA: Diagnosis not present

## 2019-10-04 DIAGNOSIS — E6609 Other obesity due to excess calories: Secondary | ICD-10-CM | POA: Diagnosis not present

## 2019-10-18 DIAGNOSIS — Z903 Acquired absence of stomach [part of]: Secondary | ICD-10-CM | POA: Diagnosis not present

## 2019-10-29 DIAGNOSIS — K5909 Other constipation: Secondary | ICD-10-CM | POA: Diagnosis not present

## 2019-10-29 DIAGNOSIS — Z6836 Body mass index (BMI) 36.0-36.9, adult: Secondary | ICD-10-CM | POA: Diagnosis not present

## 2019-10-29 DIAGNOSIS — E669 Obesity, unspecified: Secondary | ICD-10-CM | POA: Diagnosis not present

## 2019-10-29 DIAGNOSIS — K219 Gastro-esophageal reflux disease without esophagitis: Secondary | ICD-10-CM | POA: Diagnosis not present

## 2019-12-04 DIAGNOSIS — Z6832 Body mass index (BMI) 32.0-32.9, adult: Secondary | ICD-10-CM | POA: Diagnosis not present

## 2019-12-04 DIAGNOSIS — Z903 Acquired absence of stomach [part of]: Secondary | ICD-10-CM | POA: Diagnosis not present

## 2019-12-04 DIAGNOSIS — E6609 Other obesity due to excess calories: Secondary | ICD-10-CM | POA: Diagnosis not present

## 2019-12-17 MED FILL — ETONOGESTREL-ETHINYL ESTRAD: 0.12-0.015 | 84 days supply | Qty: 3 | Fill #3

## 2019-12-17 MED FILL — LINZESS 290 MCG CAPSULE: 290 | 90 days supply | Qty: 90 | Fill #2

## 2020-01-30 DIAGNOSIS — Z114 Encounter for screening for human immunodeficiency virus [HIV]: Secondary | ICD-10-CM | POA: Diagnosis not present

## 2020-01-30 DIAGNOSIS — Z113 Encounter for screening for infections with a predominantly sexual mode of transmission: Secondary | ICD-10-CM | POA: Diagnosis not present

## 2020-01-30 DIAGNOSIS — B9689 Other specified bacterial agents as the cause of diseases classified elsewhere: Secondary | ICD-10-CM | POA: Diagnosis not present

## 2020-01-30 DIAGNOSIS — N760 Acute vaginitis: Secondary | ICD-10-CM | POA: Diagnosis not present

## 2020-01-30 DIAGNOSIS — Z1159 Encounter for screening for other viral diseases: Secondary | ICD-10-CM | POA: Diagnosis not present

## 2020-02-15 DIAGNOSIS — Z3042 Encounter for surveillance of injectable contraceptive: Secondary | ICD-10-CM | POA: Diagnosis not present

## 2020-02-15 DIAGNOSIS — Z3202 Encounter for pregnancy test, result negative: Secondary | ICD-10-CM | POA: Diagnosis not present

## 2020-03-15 DIAGNOSIS — R21 Rash and other nonspecific skin eruption: Secondary | ICD-10-CM | POA: Diagnosis not present

## 2020-03-17 ENCOUNTER — Other Ambulatory Visit (HOSPITAL_COMMUNITY): Payer: Self-pay | Admitting: Gastroenterology

## 2020-03-17 MED FILL — LINZESS 290 MCG CAPSULE: 290 | 90 days supply | Qty: 90 | Fill #0

## 2020-03-19 MED FILL — ETONOGESTREL-ETHINYL ESTRAD: 0.12-0.015 | 84 days supply | Qty: 3 | Fill #0

## 2020-03-26 MED FILL — FLUCONAZOLE 150 MG TABS: 150 | 6 days supply | Qty: 2 | Fill #0

## 2020-04-23 ENCOUNTER — Other Ambulatory Visit (HOSPITAL_COMMUNITY): Payer: Self-pay | Admitting: Obstetrics and Gynecology

## 2020-04-23 DIAGNOSIS — Z01419 Encounter for gynecological examination (general) (routine) without abnormal findings: Secondary | ICD-10-CM | POA: Diagnosis not present

## 2020-04-23 MED FILL — VALACYCLOVIR HCL 500 MG TAB: 500 | 15 days supply | Qty: 30 | Fill #0

## 2020-05-29 DIAGNOSIS — Z903 Acquired absence of stomach [part of]: Secondary | ICD-10-CM | POA: Diagnosis not present

## 2020-05-29 DIAGNOSIS — K5909 Other constipation: Secondary | ICD-10-CM | POA: Diagnosis not present

## 2020-05-29 DIAGNOSIS — K219 Gastro-esophageal reflux disease without esophagitis: Secondary | ICD-10-CM | POA: Diagnosis not present

## 2020-05-29 DIAGNOSIS — E669 Obesity, unspecified: Secondary | ICD-10-CM | POA: Diagnosis not present

## 2020-06-03 ENCOUNTER — Other Ambulatory Visit (HOSPITAL_COMMUNITY): Payer: Self-pay | Admitting: Nurse Practitioner

## 2020-06-03 DIAGNOSIS — F419 Anxiety disorder, unspecified: Secondary | ICD-10-CM | POA: Diagnosis not present

## 2020-06-03 DIAGNOSIS — F4321 Adjustment disorder with depressed mood: Secondary | ICD-10-CM | POA: Diagnosis not present

## 2020-06-03 DIAGNOSIS — F32A Depression, unspecified: Secondary | ICD-10-CM | POA: Diagnosis not present

## 2020-06-03 MED FILL — LINZESS 290 MCG CAPSULE: 290 | 90 days supply | Qty: 90 | Fill #1

## 2020-06-03 MED FILL — ETONOGESTREL-ETHINYL ESTRAD: 0.12-0.015 | 84 days supply | Qty: 3 | Fill #0

## 2020-06-03 MED FILL — SERTRALINE HCL 50 MG TABLET: 50 | 30 days supply | Qty: 30 | Fill #0

## 2020-06-17 DIAGNOSIS — E559 Vitamin D deficiency, unspecified: Secondary | ICD-10-CM | POA: Diagnosis not present

## 2020-06-17 DIAGNOSIS — Z13220 Encounter for screening for lipoid disorders: Secondary | ICD-10-CM | POA: Diagnosis not present

## 2020-06-17 DIAGNOSIS — Z1331 Encounter for screening for depression: Secondary | ICD-10-CM | POA: Diagnosis not present

## 2020-06-17 DIAGNOSIS — Z114 Encounter for screening for human immunodeficiency virus [HIV]: Secondary | ICD-10-CM | POA: Diagnosis not present

## 2020-06-17 DIAGNOSIS — Z131 Encounter for screening for diabetes mellitus: Secondary | ICD-10-CM | POA: Diagnosis not present

## 2020-06-17 DIAGNOSIS — Z0000 Encounter for general adult medical examination without abnormal findings: Secondary | ICD-10-CM | POA: Diagnosis not present

## 2020-06-17 DIAGNOSIS — Z1159 Encounter for screening for other viral diseases: Secondary | ICD-10-CM | POA: Diagnosis not present

## 2020-06-17 DIAGNOSIS — R030 Elevated blood-pressure reading, without diagnosis of hypertension: Secondary | ICD-10-CM | POA: Diagnosis not present

## 2020-06-17 DIAGNOSIS — R8290 Unspecified abnormal findings in urine: Secondary | ICD-10-CM | POA: Diagnosis not present

## 2020-06-17 LAB — BASIC METABOLIC PANEL
BUN: 15 (ref 4–21)
CO2: 26 — AB (ref 13–22)
Chloride: 104 (ref 99–108)
Creatinine: 0.7 (ref <=1.1)
Glucose: 79
Potassium: 4.7 mEq/L (ref 3.5–5.1)
Sodium: 135 — AB (ref 137–147)

## 2020-06-17 LAB — CBC: RBC: 5.37 — AB (ref 3.87–5.11)

## 2020-06-17 LAB — CBC AND DIFFERENTIAL
HCT: 39 (ref 36–46)
Hemoglobin: 11.7 — AB (ref 12.0–16.0)
Platelets: 296 10*3/uL (ref 150–400)
WBC: 4.6

## 2020-06-17 LAB — COMPREHENSIVE METABOLIC PANEL
Albumin: 3.5 (ref 3.5–5.0)
Calcium: 9 (ref 8.7–10.7)
Globulin: 2.4

## 2020-06-17 LAB — HEPATIC FUNCTION PANEL
ALT: 10 U/L (ref 7–35)
AST: 15 (ref 13–35)
Alkaline Phosphatase: 60 (ref 25–125)
Bilirubin, Total: 0.6

## 2020-06-17 LAB — LIPID PANEL
Cholesterol: 178 (ref 0–200)
HDL: 78 — AB (ref 35–70)
LDL Cholesterol: 83
Triglycerides: 83 (ref 40–160)

## 2020-06-17 LAB — HEMOGLOBIN A1C: Hemoglobin A1C: 5.6

## 2020-06-17 LAB — VITAMIN D 25 HYDROXY (VIT D DEFICIENCY, FRACTURES): Vit D, 25-Hydroxy: 60.49

## 2020-06-17 LAB — HM HIV SCREENING LAB: HM HIV Screening: NEGATIVE

## 2020-06-27 DIAGNOSIS — Z8249 Family history of ischemic heart disease and other diseases of the circulatory system: Secondary | ICD-10-CM | POA: Diagnosis not present

## 2020-07-09 DIAGNOSIS — Z8249 Family history of ischemic heart disease and other diseases of the circulatory system: Secondary | ICD-10-CM | POA: Diagnosis not present

## 2020-07-15 DIAGNOSIS — K912 Postsurgical malabsorption, not elsewhere classified: Secondary | ICD-10-CM | POA: Diagnosis not present

## 2020-07-15 DIAGNOSIS — Z903 Acquired absence of stomach [part of]: Secondary | ICD-10-CM | POA: Diagnosis not present

## 2020-07-31 DIAGNOSIS — I10 Essential (primary) hypertension: Secondary | ICD-10-CM | POA: Diagnosis not present

## 2020-07-31 DIAGNOSIS — E611 Iron deficiency: Secondary | ICD-10-CM | POA: Diagnosis not present

## 2020-07-31 DIAGNOSIS — Z9989 Dependence on other enabling machines and devices: Secondary | ICD-10-CM | POA: Diagnosis not present

## 2020-07-31 DIAGNOSIS — E669 Obesity, unspecified: Secondary | ICD-10-CM | POA: Diagnosis not present

## 2020-07-31 DIAGNOSIS — Z903 Acquired absence of stomach [part of]: Secondary | ICD-10-CM | POA: Diagnosis not present

## 2020-07-31 DIAGNOSIS — K912 Postsurgical malabsorption, not elsewhere classified: Secondary | ICD-10-CM | POA: Diagnosis not present

## 2020-07-31 DIAGNOSIS — G4733 Obstructive sleep apnea (adult) (pediatric): Secondary | ICD-10-CM | POA: Diagnosis not present

## 2020-07-31 DIAGNOSIS — K219 Gastro-esophageal reflux disease without esophagitis: Secondary | ICD-10-CM | POA: Diagnosis not present

## 2020-08-14 DIAGNOSIS — K5909 Other constipation: Secondary | ICD-10-CM | POA: Diagnosis not present

## 2020-08-14 DIAGNOSIS — E669 Obesity, unspecified: Secondary | ICD-10-CM | POA: Diagnosis not present

## 2020-08-14 DIAGNOSIS — Z6830 Body mass index (BMI) 30.0-30.9, adult: Secondary | ICD-10-CM | POA: Diagnosis not present

## 2020-08-14 DIAGNOSIS — K912 Postsurgical malabsorption, not elsewhere classified: Secondary | ICD-10-CM | POA: Diagnosis not present

## 2020-08-14 DIAGNOSIS — Z903 Acquired absence of stomach [part of]: Secondary | ICD-10-CM | POA: Diagnosis not present

## 2020-08-14 DIAGNOSIS — D500 Iron deficiency anemia secondary to blood loss (chronic): Secondary | ICD-10-CM | POA: Diagnosis not present

## 2020-08-14 DIAGNOSIS — I10 Essential (primary) hypertension: Secondary | ICD-10-CM | POA: Diagnosis not present

## 2020-08-16 DIAGNOSIS — R635 Abnormal weight gain: Secondary | ICD-10-CM | POA: Diagnosis not present

## 2020-08-19 ENCOUNTER — Other Ambulatory Visit (HOSPITAL_COMMUNITY): Payer: Self-pay | Admitting: Family Medicine

## 2020-08-19 ENCOUNTER — Ambulatory Visit (HOSPITAL_COMMUNITY)
Admission: RE | Admit: 2020-08-19 | Discharge: 2020-08-19 | Disposition: A | Discharge status: Home / Self Care | Payer: 59 | Source: Ambulatory Visit | Attending: Family Medicine | Admitting: Family Medicine

## 2020-08-19 DIAGNOSIS — M898X5 Other specified disorders of bone, thigh: Secondary | ICD-10-CM

## 2020-08-19 DIAGNOSIS — W19XXXA Unspecified fall, initial encounter: Secondary | ICD-10-CM

## 2020-08-22 DIAGNOSIS — K5909 Other constipation: Secondary | ICD-10-CM | POA: Diagnosis not present

## 2020-08-22 DIAGNOSIS — E669 Obesity, unspecified: Secondary | ICD-10-CM | POA: Diagnosis not present

## 2020-08-22 DIAGNOSIS — D500 Iron deficiency anemia secondary to blood loss (chronic): Secondary | ICD-10-CM | POA: Diagnosis not present

## 2020-08-22 DIAGNOSIS — I10 Essential (primary) hypertension: Secondary | ICD-10-CM | POA: Diagnosis not present

## 2020-08-22 DIAGNOSIS — Z6830 Body mass index (BMI) 30.0-30.9, adult: Secondary | ICD-10-CM | POA: Diagnosis not present

## 2020-08-22 DIAGNOSIS — K912 Postsurgical malabsorption, not elsewhere classified: Secondary | ICD-10-CM | POA: Diagnosis not present

## 2020-08-22 DIAGNOSIS — Z903 Acquired absence of stomach [part of]: Secondary | ICD-10-CM | POA: Diagnosis not present

## 2020-08-26 MED FILL — LINZESS 290 MCG CAPSULE: 290 | 90 days supply | Qty: 90 | Fill #2

## 2020-08-26 MED FILL — ETONOGESTREL-ETHINYL ESTRAD: 0.12-0.015 | 84 days supply | Qty: 3 | Fill #1

## 2020-08-27 DIAGNOSIS — N898 Other specified noninflammatory disorders of vagina: Secondary | ICD-10-CM | POA: Diagnosis not present

## 2020-08-30 DIAGNOSIS — N898 Other specified noninflammatory disorders of vagina: Secondary | ICD-10-CM | POA: Diagnosis not present

## 2020-09-13 DIAGNOSIS — Z3200 Encounter for pregnancy test, result unknown: Secondary | ICD-10-CM | POA: Diagnosis not present

## 2020-09-13 DIAGNOSIS — Z6834 Body mass index (BMI) 34.0-34.9, adult: Secondary | ICD-10-CM | POA: Diagnosis not present

## 2020-09-13 DIAGNOSIS — R635 Abnormal weight gain: Secondary | ICD-10-CM | POA: Diagnosis not present

## 2020-10-11 DIAGNOSIS — R635 Abnormal weight gain: Secondary | ICD-10-CM | POA: Diagnosis not present

## 2020-10-11 DIAGNOSIS — Z6834 Body mass index (BMI) 34.0-34.9, adult: Secondary | ICD-10-CM | POA: Diagnosis not present

## 2020-10-21 DIAGNOSIS — D500 Iron deficiency anemia secondary to blood loss (chronic): Secondary | ICD-10-CM | POA: Diagnosis not present

## 2020-10-24 DIAGNOSIS — K912 Postsurgical malabsorption, not elsewhere classified: Secondary | ICD-10-CM | POA: Diagnosis not present

## 2020-10-24 DIAGNOSIS — Z903 Acquired absence of stomach [part of]: Secondary | ICD-10-CM | POA: Diagnosis not present

## 2020-10-24 DIAGNOSIS — D500 Iron deficiency anemia secondary to blood loss (chronic): Secondary | ICD-10-CM | POA: Diagnosis not present

## 2020-11-06 DIAGNOSIS — R790 Abnormal level of blood mineral: Secondary | ICD-10-CM | POA: Diagnosis not present

## 2020-11-18 DIAGNOSIS — K5909 Other constipation: Secondary | ICD-10-CM | POA: Diagnosis not present

## 2020-11-18 DIAGNOSIS — Z903 Acquired absence of stomach [part of]: Secondary | ICD-10-CM | POA: Diagnosis not present

## 2020-11-18 DIAGNOSIS — K5904 Chronic idiopathic constipation: Secondary | ICD-10-CM | POA: Diagnosis not present

## 2020-11-18 DIAGNOSIS — R790 Abnormal level of blood mineral: Secondary | ICD-10-CM | POA: Diagnosis not present

## 2020-11-18 DIAGNOSIS — D509 Iron deficiency anemia, unspecified: Secondary | ICD-10-CM | POA: Diagnosis not present

## 2020-11-18 DIAGNOSIS — K219 Gastro-esophageal reflux disease without esophagitis: Secondary | ICD-10-CM | POA: Diagnosis not present

## 2020-11-25 ENCOUNTER — Other Ambulatory Visit (HOSPITAL_COMMUNITY): Payer: Self-pay

## 2020-11-25 MED FILL — Etonogestrel-Ethinyl Estradiol VA Ring 0.12-0.015 MG/24HR: VAGINAL | 84 days supply | Qty: 3 | Fill #0 | Status: AC

## 2020-11-25 MED FILL — Linaclotide Cap 290 MCG: ORAL | 90 days supply | Qty: 90 | Fill #0 | Status: AC

## 2020-11-27 ENCOUNTER — Other Ambulatory Visit (HOSPITAL_COMMUNITY): Payer: Self-pay

## 2020-12-07 DIAGNOSIS — R790 Abnormal level of blood mineral: Secondary | ICD-10-CM | POA: Diagnosis not present

## 2020-12-08 DIAGNOSIS — H52201 Unspecified astigmatism, right eye: Secondary | ICD-10-CM | POA: Diagnosis not present

## 2020-12-08 DIAGNOSIS — H11131 Conjunctival pigmentations, right eye: Secondary | ICD-10-CM | POA: Diagnosis not present

## 2020-12-08 DIAGNOSIS — H5213 Myopia, bilateral: Secondary | ICD-10-CM | POA: Diagnosis not present

## 2020-12-19 DIAGNOSIS — D509 Iron deficiency anemia, unspecified: Secondary | ICD-10-CM | POA: Diagnosis not present

## 2020-12-19 DIAGNOSIS — Z903 Acquired absence of stomach [part of]: Secondary | ICD-10-CM | POA: Diagnosis not present

## 2021-01-07 ENCOUNTER — Other Ambulatory Visit (HOSPITAL_COMMUNITY): Payer: Self-pay

## 2021-01-07 DIAGNOSIS — I10 Essential (primary) hypertension: Secondary | ICD-10-CM | POA: Diagnosis not present

## 2021-01-07 DIAGNOSIS — D508 Other iron deficiency anemias: Secondary | ICD-10-CM | POA: Diagnosis not present

## 2021-01-07 DIAGNOSIS — Z903 Acquired absence of stomach [part of]: Secondary | ICD-10-CM | POA: Diagnosis not present

## 2021-01-07 DIAGNOSIS — K219 Gastro-esophageal reflux disease without esophagitis: Secondary | ICD-10-CM | POA: Diagnosis not present

## 2021-01-07 DIAGNOSIS — K5909 Other constipation: Secondary | ICD-10-CM | POA: Diagnosis not present

## 2021-01-07 DIAGNOSIS — E669 Obesity, unspecified: Secondary | ICD-10-CM | POA: Diagnosis not present

## 2021-01-07 MED ORDER — LINZESS 290 MCG PO CAPS
290.0000 ug | ORAL_CAPSULE | Freq: Every day | ORAL | 3 refills | Status: DC
  Filled 2021-01-07 – 2021-03-02 (×3): qty 90, 90d supply, fill #0
  Filled 2021-05-27: qty 90, 90d supply, fill #1
  Filled 2021-09-01: qty 90, 90d supply, fill #2
  Filled 2021-12-14 (×2): qty 90, 90d supply, fill #3

## 2021-01-15 DIAGNOSIS — Z903 Acquired absence of stomach [part of]: Secondary | ICD-10-CM | POA: Diagnosis not present

## 2021-01-15 DIAGNOSIS — K912 Postsurgical malabsorption, not elsewhere classified: Secondary | ICD-10-CM | POA: Diagnosis not present

## 2021-01-19 DIAGNOSIS — Z903 Acquired absence of stomach [part of]: Secondary | ICD-10-CM | POA: Diagnosis not present

## 2021-01-19 DIAGNOSIS — K912 Postsurgical malabsorption, not elsewhere classified: Secondary | ICD-10-CM | POA: Diagnosis not present

## 2021-01-29 DIAGNOSIS — D508 Other iron deficiency anemias: Secondary | ICD-10-CM | POA: Diagnosis not present

## 2021-01-29 DIAGNOSIS — I10 Essential (primary) hypertension: Secondary | ICD-10-CM | POA: Diagnosis not present

## 2021-01-29 DIAGNOSIS — Z903 Acquired absence of stomach [part of]: Secondary | ICD-10-CM | POA: Diagnosis not present

## 2021-01-29 DIAGNOSIS — K5909 Other constipation: Secondary | ICD-10-CM | POA: Diagnosis not present

## 2021-01-29 DIAGNOSIS — K219 Gastro-esophageal reflux disease without esophagitis: Secondary | ICD-10-CM | POA: Diagnosis not present

## 2021-01-29 DIAGNOSIS — K912 Postsurgical malabsorption, not elsewhere classified: Secondary | ICD-10-CM | POA: Diagnosis not present

## 2021-02-03 ENCOUNTER — Other Ambulatory Visit (HOSPITAL_COMMUNITY): Payer: Self-pay

## 2021-02-03 MED FILL — Etonogestrel-Ethinyl Estradiol VA Ring 0.12-0.015 MG/24HR: VAGINAL | 84 days supply | Qty: 3 | Fill #1 | Status: AC

## 2021-02-04 ENCOUNTER — Other Ambulatory Visit (HOSPITAL_COMMUNITY): Payer: Self-pay

## 2021-03-02 ENCOUNTER — Other Ambulatory Visit (HOSPITAL_COMMUNITY): Payer: Self-pay

## 2021-03-19 ENCOUNTER — Telehealth: Payer: 59 | Admitting: Family Medicine

## 2021-03-19 ENCOUNTER — Other Ambulatory Visit (HOSPITAL_COMMUNITY): Payer: Self-pay

## 2021-03-19 DIAGNOSIS — B9789 Other viral agents as the cause of diseases classified elsewhere: Secondary | ICD-10-CM

## 2021-03-19 DIAGNOSIS — J329 Chronic sinusitis, unspecified: Secondary | ICD-10-CM

## 2021-03-19 MED ORDER — FLUTICASONE PROPIONATE 50 MCG/ACT NA SUSP
2.0000 | Freq: Every day | NASAL | 1 refills | Status: DC
  Filled 2021-03-19: qty 16, 30d supply, fill #0

## 2021-03-19 NOTE — Progress Notes (Signed)

## 2021-03-20 DIAGNOSIS — K912 Postsurgical malabsorption, not elsewhere classified: Secondary | ICD-10-CM | POA: Diagnosis not present

## 2021-03-20 DIAGNOSIS — Z903 Acquired absence of stomach [part of]: Secondary | ICD-10-CM | POA: Diagnosis not present

## 2021-03-20 DIAGNOSIS — D500 Iron deficiency anemia secondary to blood loss (chronic): Secondary | ICD-10-CM | POA: Diagnosis not present

## 2021-03-21 ENCOUNTER — Telehealth: Payer: No Typology Code available for payment source | Admitting: Nurse Practitioner

## 2021-03-21 DIAGNOSIS — J069 Acute upper respiratory infection, unspecified: Secondary | ICD-10-CM

## 2021-03-21 MED ORDER — BENZONATATE 100 MG PO CAPS: 100.0000 mg | ORAL_CAPSULE | Freq: Three times a day (TID) | ORAL | 0 refills | Status: DC | PRN

## 2021-03-21 NOTE — Progress Notes (Signed)

## 2021-03-22 DIAGNOSIS — R790 Abnormal level of blood mineral: Secondary | ICD-10-CM | POA: Insufficient documentation

## 2021-03-27 ENCOUNTER — Telehealth: Payer: 59 | Admitting: Physician Assistant

## 2021-03-27 DIAGNOSIS — D508 Other iron deficiency anemias: Secondary | ICD-10-CM | POA: Diagnosis not present

## 2021-03-27 DIAGNOSIS — B9689 Other specified bacterial agents as the cause of diseases classified elsewhere: Secondary | ICD-10-CM | POA: Diagnosis not present

## 2021-03-27 DIAGNOSIS — J019 Acute sinusitis, unspecified: Secondary | ICD-10-CM | POA: Diagnosis not present

## 2021-03-27 MED ORDER — AMOXICILLIN-POT CLAVULANATE 875-125 MG PO TABS: 1.0000 | ORAL_TABLET | Freq: Two times a day (BID) | ORAL | 0 refills | Status: DC

## 2021-03-27 NOTE — Progress Notes (Signed)

## 2021-04-06 ENCOUNTER — Telehealth: Payer: 59 | Admitting: Physician Assistant

## 2021-04-06 ENCOUNTER — Other Ambulatory Visit (HOSPITAL_COMMUNITY): Payer: Self-pay

## 2021-04-06 DIAGNOSIS — T3695XA Adverse effect of unspecified systemic antibiotic, initial encounter: Secondary | ICD-10-CM | POA: Diagnosis not present

## 2021-04-06 DIAGNOSIS — B379 Candidiasis, unspecified: Secondary | ICD-10-CM | POA: Diagnosis not present

## 2021-04-06 MED ORDER — FLUCONAZOLE 150 MG PO TABS
150.0000 mg | ORAL_TABLET | Freq: Once | ORAL | 0 refills | Status: AC
  Filled 2021-04-06: qty 1, 1d supply, fill #0

## 2021-04-06 NOTE — Progress Notes (Signed)

## 2021-04-27 ENCOUNTER — Other Ambulatory Visit (HOSPITAL_COMMUNITY): Payer: Self-pay | Admitting: Obstetrics and Gynecology

## 2021-04-27 ENCOUNTER — Other Ambulatory Visit (HOSPITAL_COMMUNITY): Payer: Self-pay

## 2021-05-01 DIAGNOSIS — D500 Iron deficiency anemia secondary to blood loss (chronic): Secondary | ICD-10-CM | POA: Diagnosis not present

## 2021-05-06 ENCOUNTER — Other Ambulatory Visit (HOSPITAL_COMMUNITY): Payer: Self-pay

## 2021-05-06 MED ORDER — ETONOGESTREL-ETHINYL ESTRADIOL 0.12-0.015 MG/24HR VA RING
VAGINAL_RING | VAGINAL | 0 refills | Status: DC
  Filled 2021-05-06: qty 1, 28d supply, fill #0

## 2021-05-08 ENCOUNTER — Other Ambulatory Visit (HOSPITAL_COMMUNITY): Payer: Self-pay

## 2021-05-08 DIAGNOSIS — Z1159 Encounter for screening for other viral diseases: Secondary | ICD-10-CM | POA: Diagnosis not present

## 2021-05-08 DIAGNOSIS — Z1151 Encounter for screening for human papillomavirus (HPV): Secondary | ICD-10-CM | POA: Diagnosis not present

## 2021-05-08 DIAGNOSIS — Z113 Encounter for screening for infections with a predominantly sexual mode of transmission: Secondary | ICD-10-CM | POA: Diagnosis not present

## 2021-05-08 DIAGNOSIS — Z01419 Encounter for gynecological examination (general) (routine) without abnormal findings: Secondary | ICD-10-CM | POA: Diagnosis not present

## 2021-05-08 DIAGNOSIS — Z114 Encounter for screening for human immunodeficiency virus [HIV]: Secondary | ICD-10-CM | POA: Diagnosis not present

## 2021-05-08 DIAGNOSIS — Z01411 Encounter for gynecological examination (general) (routine) with abnormal findings: Secondary | ICD-10-CM | POA: Diagnosis not present

## 2021-05-08 MED ORDER — ETONOGESTREL-ETHINYL ESTRADIOL 0.12-0.015 MG/24HR VA RING
VAGINAL_RING | VAGINAL | 3 refills | Status: DC
  Filled 2021-05-08 – 2021-05-29 (×3): qty 3, 84d supply, fill #0

## 2021-05-27 ENCOUNTER — Other Ambulatory Visit (HOSPITAL_COMMUNITY): Payer: Self-pay

## 2021-05-29 ENCOUNTER — Other Ambulatory Visit (HOSPITAL_COMMUNITY): Payer: Self-pay

## 2021-07-08 DIAGNOSIS — Z903 Acquired absence of stomach [part of]: Secondary | ICD-10-CM | POA: Diagnosis not present

## 2021-07-08 DIAGNOSIS — K912 Postsurgical malabsorption, not elsewhere classified: Secondary | ICD-10-CM | POA: Diagnosis not present

## 2021-07-13 ENCOUNTER — Other Ambulatory Visit (HOSPITAL_COMMUNITY): Payer: Self-pay

## 2021-07-13 MED ORDER — VALACYCLOVIR HCL 500 MG PO TABS
500.0000 mg | ORAL_TABLET | Freq: Two times a day (BID) | ORAL | 5 refills | Status: DC
  Filled 2021-07-13 – 2021-08-05 (×2): qty 30, 15d supply, fill #0
  Filled 2022-04-11: qty 30, 15d supply, fill #1

## 2021-07-21 ENCOUNTER — Other Ambulatory Visit (HOSPITAL_COMMUNITY): Payer: Self-pay

## 2021-07-31 ENCOUNTER — Other Ambulatory Visit (HOSPITAL_COMMUNITY): Payer: Self-pay

## 2021-07-31 DIAGNOSIS — K219 Gastro-esophageal reflux disease without esophagitis: Secondary | ICD-10-CM | POA: Diagnosis not present

## 2021-07-31 DIAGNOSIS — Z903 Acquired absence of stomach [part of]: Secondary | ICD-10-CM | POA: Diagnosis not present

## 2021-07-31 DIAGNOSIS — D508 Other iron deficiency anemias: Secondary | ICD-10-CM | POA: Diagnosis not present

## 2021-07-31 DIAGNOSIS — I10 Essential (primary) hypertension: Secondary | ICD-10-CM | POA: Diagnosis not present

## 2021-07-31 DIAGNOSIS — K5909 Other constipation: Secondary | ICD-10-CM | POA: Diagnosis not present

## 2021-07-31 DIAGNOSIS — K912 Postsurgical malabsorption, not elsewhere classified: Secondary | ICD-10-CM | POA: Diagnosis not present

## 2021-07-31 DIAGNOSIS — R790 Abnormal level of blood mineral: Secondary | ICD-10-CM | POA: Diagnosis not present

## 2021-07-31 MED ORDER — VITAMIN D (ERGOCALCIFEROL) 1.25 MG (50000 UNIT) PO CAPS
50000.0000 [IU] | ORAL_CAPSULE | ORAL | 11 refills | Status: DC
  Filled 2021-07-31: qty 12, 84d supply, fill #0
  Filled 2022-04-11: qty 12, 84d supply, fill #1
  Filled 2022-07-03: qty 12, 84d supply, fill #2

## 2021-08-05 ENCOUNTER — Other Ambulatory Visit (HOSPITAL_COMMUNITY): Payer: Self-pay

## 2021-08-12 DIAGNOSIS — Z903 Acquired absence of stomach [part of]: Secondary | ICD-10-CM | POA: Diagnosis not present

## 2021-08-12 DIAGNOSIS — Z713 Dietary counseling and surveillance: Secondary | ICD-10-CM | POA: Diagnosis not present

## 2021-08-14 DIAGNOSIS — K912 Postsurgical malabsorption, not elsewhere classified: Secondary | ICD-10-CM | POA: Diagnosis not present

## 2021-08-14 DIAGNOSIS — Z903 Acquired absence of stomach [part of]: Secondary | ICD-10-CM | POA: Diagnosis not present

## 2021-08-14 DIAGNOSIS — D508 Other iron deficiency anemias: Secondary | ICD-10-CM | POA: Diagnosis not present

## 2021-08-24 DIAGNOSIS — Z903 Acquired absence of stomach [part of]: Secondary | ICD-10-CM | POA: Diagnosis not present

## 2021-08-24 DIAGNOSIS — Z723 Lack of physical exercise: Secondary | ICD-10-CM | POA: Diagnosis not present

## 2021-08-24 DIAGNOSIS — M6281 Muscle weakness (generalized): Secondary | ICD-10-CM | POA: Diagnosis not present

## 2021-09-01 ENCOUNTER — Other Ambulatory Visit (HOSPITAL_COMMUNITY): Payer: Self-pay

## 2021-09-15 ENCOUNTER — Other Ambulatory Visit (HOSPITAL_COMMUNITY): Payer: Self-pay

## 2021-09-15 ENCOUNTER — Telehealth: Payer: 59 | Admitting: Physician Assistant

## 2021-09-15 DIAGNOSIS — J019 Acute sinusitis, unspecified: Secondary | ICD-10-CM | POA: Diagnosis not present

## 2021-09-15 DIAGNOSIS — B9789 Other viral agents as the cause of diseases classified elsewhere: Secondary | ICD-10-CM

## 2021-09-15 MED ORDER — IPRATROPIUM BROMIDE 0.03 % NA SOLN
2.0000 | Freq: Two times a day (BID) | NASAL | 0 refills | Status: DC
  Filled 2021-09-15: qty 30, 43d supply, fill #0

## 2021-09-15 NOTE — Progress Notes (Signed)
E-Visit for Sinus Problems  We are sorry that you are not feeling well.  Here is how we plan to help!  Based on what you have shared with me it looks like you have sinusitis.  Sinusitis is inflammation and infection in the sinus cavities of the head.  Based on your presentation I believe you most likely have Acute Viral Sinusitis.This is an infection most likely caused by a virus. There is not specific treatment for viral sinusitis other than to help you with the symptoms until the infection runs its course.  You may use an oral decongestant such as Mucinex D or if you have glaucoma or high blood pressure use plain Mucinex. Saline nasal spray help and can safely be used as often as needed for congestion, I have prescribed: Ipratropium Bromide nasal spray 0.03% 2 sprays in eah nostril 2-3 times a day. Continue flonase.   Some authorities believe that zinc sprays or the use of Echinacea may shorten the course of your symptoms.  Sinus infections are not as easily transmitted as other respiratory infection, however we still recommend that you avoid close contact with loved ones, especially the very young and elderly.  Remember to wash your hands thoroughly throughout the day as this is the number one way to prevent the spread of infection!  May consider testing for Covid 19 if desired.   Home Care: Only take medications as instructed by your medical team. Do not take these medications with alcohol. A steam or ultrasonic humidifier can help congestion.  You can place a towel over your head and breathe in the steam from hot water coming from a faucet. Avoid close contacts especially the very young and the elderly. Cover your mouth when you cough or sneeze. Always remember to wash your hands.  Get Help Right Away If: You develop worsening fever or sinus pain. You develop a severe head ache or visual changes. Your symptoms persist after you have completed your treatment plan.  Make sure  you Understand these instructions. Will watch your condition. Will get help right away if you are not doing well or get worse.   Thank you for choosing an e-visit.  Your e-visit answers were reviewed by a board certified advanced clinical practitioner to complete your personal care plan. Depending upon the condition, your plan could have included both over the counter or prescription medications.  Please review your pharmacy choice. Make sure the pharmacy is open so you can pick up prescription now. If there is a problem, you may contact your provider through [employer] and have the prescription routed to another pharmacy.  Your safety is important to [place]. If you have drug allergies check your prescription carefully.   For the next 24 hours you can use MyChart to ask questions about today's visit, request a non-urgent call back, or ask for a work or school excuse. You will get an email in the next two days asking about your experience. I hope that your e-visit has been valuable and will speed your recovery.  I provided 5 minutes of non face-to-face time during this encounter for chart review and documentation.

## 2021-09-23 ENCOUNTER — Other Ambulatory Visit (HOSPITAL_COMMUNITY): Payer: Self-pay

## 2021-09-30 ENCOUNTER — Other Ambulatory Visit (HOSPITAL_COMMUNITY): Payer: Self-pay

## 2021-09-30 MED ORDER — IBUPROFEN 800 MG PO TABS
800.0000 mg | ORAL_TABLET | Freq: Three times a day (TID) | ORAL | 0 refills | Status: DC | PRN
  Filled 2021-09-30: qty 30, 10d supply, fill #0

## 2021-10-01 DIAGNOSIS — R102 Pelvic and perineal pain: Secondary | ICD-10-CM | POA: Diagnosis not present

## 2021-10-01 DIAGNOSIS — R112 Nausea with vomiting, unspecified: Secondary | ICD-10-CM | POA: Diagnosis not present

## 2021-10-21 ENCOUNTER — Other Ambulatory Visit (HOSPITAL_COMMUNITY): Payer: Self-pay

## 2021-10-21 MED ORDER — CARESTART COVID-19 HOME TEST VI KIT
PACK | 0 refills | Status: DC
  Filled 2021-10-21: qty 4, 8d supply, fill #0

## 2021-10-22 ENCOUNTER — Other Ambulatory Visit (HOSPITAL_COMMUNITY): Payer: Self-pay

## 2021-10-22 ENCOUNTER — Telehealth: Payer: 59 | Admitting: Family Medicine

## 2021-10-22 ENCOUNTER — Emergency Department (HOSPITAL_BASED_OUTPATIENT_CLINIC_OR_DEPARTMENT_OTHER)
Admission: EM | Admit: 2021-10-22 | Discharge: 2021-10-22 | Disposition: A | Discharge status: Home / Self Care | Payer: 59 | Attending: Emergency Medicine | Admitting: Emergency Medicine

## 2021-10-22 ENCOUNTER — Other Ambulatory Visit: Payer: Self-pay

## 2021-10-22 ENCOUNTER — Encounter (HOSPITAL_BASED_OUTPATIENT_CLINIC_OR_DEPARTMENT_OTHER): Payer: Self-pay | Admitting: Emergency Medicine

## 2021-10-22 DIAGNOSIS — J028 Acute pharyngitis due to other specified organisms: Secondary | ICD-10-CM

## 2021-10-22 DIAGNOSIS — B9789 Other viral agents as the cause of diseases classified elsewhere: Secondary | ICD-10-CM | POA: Diagnosis not present

## 2021-10-22 DIAGNOSIS — I10 Essential (primary) hypertension: Secondary | ICD-10-CM | POA: Insufficient documentation

## 2021-10-22 DIAGNOSIS — Z20822 Contact with and (suspected) exposure to covid-19: Secondary | ICD-10-CM | POA: Insufficient documentation

## 2021-10-22 DIAGNOSIS — J029 Acute pharyngitis, unspecified: Secondary | ICD-10-CM | POA: Diagnosis not present

## 2021-10-22 DIAGNOSIS — Z87891 Personal history of nicotine dependence: Secondary | ICD-10-CM | POA: Diagnosis not present

## 2021-10-22 LAB — RESP PANEL BY RT-PCR (FLU A&B, COVID) ARPGX2
Influenza A by PCR: NEGATIVE
Influenza B by PCR: NEGATIVE
SARS Coronavirus 2 by RT PCR: NEGATIVE

## 2021-10-22 LAB — GROUP A STREP BY PCR: Group A Strep by PCR: NOT DETECTED

## 2021-10-22 MED ORDER — BENZONATATE 100 MG PO CAPS
100.0000 mg | ORAL_CAPSULE | Freq: Two times a day (BID) | ORAL | 0 refills | Status: DC | PRN
  Filled 2021-10-22: qty 20, 10d supply, fill #0

## 2021-10-22 MED ORDER — DEXAMETHASONE SODIUM PHOSPHATE 10 MG/ML IJ SOLN
10.0000 mg | Freq: Once | INTRAMUSCULAR | Status: AC
  Administered 2021-10-22: 10 mg via INTRAMUSCULAR
  Filled 2021-10-22: qty 1

## 2021-10-22 NOTE — ED Triage Notes (Signed)
Pt had sore throat starting yesterday. Did E visit and was given tesslon pearls but throat pain is worsening and not having any cough. No other symptoms and no "hot potato voice".  ?

## 2021-10-22 NOTE — ED Provider Notes (Signed)
? ?Emergency Department Provider Note ? ? ?I have reviewed the triage vital signs and the nursing notes. ? ? ?HISTORY ? ?Chief Complaint ?Sore Throat ? ? ?HPI ?Natasha Rice is a 36 y.o. female with PMH reviewed presents to the ED with sore throat. No cough. She has had symptoms for 24 hours with no relief in symptoms. No voice change, SOB, or difficulty swallowing. Does have pain with swallowing. No fever. Had a tele-health visit with Rx sent for tessalon but patient was not coughing and so came here for in-person evaluation.   ? ? ?Past Medical History:  ?Diagnosis Date  ? Anxiety   ? Depression   ? GERD (gastroesophageal reflux disease)   ? Hypertension   ? Polycystic ovarian disease   ? Seasonal allergies   ? ? ?Review of Systems ? ?Constitutional: No fever/chills ?ENT: Positive sore throat. ?Cardiovascular: Denies chest pain. ?Respiratory: Denies shortness of breath. No cough.  ?Gastrointestinal: No abdominal pain.  No nausea, no vomiting.  No diarrhea.  No constipation. ?Musculoskeletal: Negative for back pain. ?Skin: Negative for rash. ?Neurological: Negative for headaches. ? ?____________________________________________ ? ? ?PHYSICAL EXAM: ? ?VITAL SIGNS: ?ED Triage Vitals  ?Enc Vitals Group  ?   BP 10/22/21 1924 (!) 121/99  ?   Pulse Rate 10/22/21 1924 86  ?   Resp 10/22/21 1924 20  ?   Temp 10/22/21 1924 98.2 ?F (36.8 ?C)  ?   Temp Source 10/22/21 1924 Oral  ?   SpO2 10/22/21 1924 100 %  ? ?Constitutional: Alert and oriented. Well appearing and in no acute distress. ?Eyes: Conjunctivae are normal.  ?Head: Atraumatic. ?Nose: No congestion/rhinnorhea. ?Mouth/Throat: Mucous membranes are moist.  Oropharynx with mild erythema. No PTA. Clear voice. Managing oral secretions. No trismus.  ?Neck: No stridor. ?Cardiovascular: Good peripheral circulation.   ?Respiratory: Normal respiratory effort.   ?Gastrointestinal: No distention.  ?Musculoskeletal: No gross deformities of extremities. ?Neurologic:  Normal  speech and language.  ?Skin:  Skin is warm, dry and intact. No rash noted. ? ?____________________________________________ ?  ?LABS ?(all labs ordered are listed, but only abnormal results are displayed) ? ?Labs Reviewed  ?GROUP A STREP BY PCR  ?RESP PANEL BY RT-PCR (FLU A&B, COVID) [id number]  ? ? ?____________________________________________ ? ? ?PROCEDURES ? ?Procedure(s) performed:  ? ?Procedures ? ?None ?____________________________________________ ? ? ?INITIAL IMPRESSION / ASSESSMENT AND PLAN / ED COURSE ? ?Pertinent labs & imaging results that were available during my care of the patient were reviewed by me and considered in my medical decision making (see chart for details). ?  ?This patient is Presenting for Evaluation of sore throat, which does require a range of treatment options, and is a complaint that involves a high risk of morbidity and mortality. ? ?The Differential Diagnoses include strep throat, PTA, retropharyngeal abscess, COVID, and Flu. ? ?Critical Interventions-  ?  ?Medications  ?dexamethasone (DECADRON) injection 10 mg (10 mg Intramuscular Given 10/22/21 2149)  ? ? ?Reassessment after intervention: Symptoms unchanged ? ?  ?Clinical Laboratory Tests Ordered, included Strep PCR along with COVID and Flu are all negative.  ? ?Social Determinants of Health Risk patient with a prior smoking history.  ? ?Medical Decision Making: Summary:  ?Patient with likely viral pharyngitis. Strep negative. Gave decadron to help with symptoms. Discussed supportive care plan and PCP follow up.  ? ?Reevaluation with update and discussion with patient. Discussed ED return precautions.  ? ?Disposition: discharge ? ?____________________________________________ ? ?FINAL CLINICAL IMPRESSION(S) / ED DIAGNOSES ? ?Final diagnoses:  ?  Viral pharyngitis  ? ?Note:  This document was prepared using Dragon voice recognition software and may include unintentional dictation errors. ? ?Alona Bene, MD, FACEP ?Emergency Medicine ? ?   ?Maia Plan, MD ?10/28/21 1154 ? ?

## 2021-10-22 NOTE — Discharge Instructions (Signed)
Please take Tylenol and or ibuprofen as needed for pain.  Drink plenty of fluids.  Return with any new or suddenly worsening symptoms. ?

## 2021-10-22 NOTE — Progress Notes (Signed)
?E-Visit for Sore Throat ? ?We are sorry that you are not feeling well.  Here is how we plan to help! ? ?Your symptoms indicate a likely viral infection (Pharyngitis).   Pharyngitis is inflammation in the back of the throat which can cause a sore throat, scratchiness and sometimes difficulty swallowing.   Pharyngitis is typically caused by a respiratory virus and will just run its course.  Please keep in mind that your symptoms could last up to 10 days.  For throat pain, we recommend over the counter oral pain relief medications such as acetaminophen or aspirin, or anti-inflammatory medications such as ibuprofen or naproxen sodium.  Topical treatments such as oral throat lozenges or sprays may be used as needed.  Avoid close contact with loved ones, especially the very young and elderly.  Remember to wash your hands thoroughly throughout the day as this is the number one way to prevent the spread of infection and wipe down door knobs and counters with disinfectant. ? ?I will send in a cough medication for you to have on hand it is called Tessalon Perles. ? ?After careful review of your answers, I would not recommend an antibiotic for your condition.  Antibiotics should not be used to treat conditions that we suspect are caused by viruses like the virus that causes the common cold or flu. However, some people can have Strep with atypical symptoms. You may need formal testing in clinic or office to confirm if your symptoms continue or worsen. ? ?Providers prescribe antibiotics to treat infections caused by bacteria. Antibiotics are very powerful in treating bacterial infections when they are used properly.  To maintain their effectiveness, they should be used only when necessary.  Overuse of antibiotics has resulted in the development of super bugs that are resistant to treatment!   ? ?Home Care: ?Only take medications as instructed by your medical team. ?Do not drink alcohol while taking these medications. ?A steam  or ultrasonic humidifier can help congestion.  You can place a towel over your head and breathe in the steam from hot water coming from a faucet. ?Avoid close contacts especially the very young and the elderly. ?Cover your mouth when you cough or sneeze. ?Always remember to wash your hands. ? ?Get Help Right Away If: ?You develop worsening fever or throat pain. ?You develop a severe head ache or visual changes. ?Your symptoms persist after you have completed your treatment plan. ? ?Make sure you ?Understand these instructions. ?Will watch your condition. ?Will get help right away if you are not doing well or get worse. ? ? ?Thank you for choosing an e-visit. ? ?Your e-visit answers were reviewed by a board certified advanced clinical practitioner to complete your personal care plan. Depending upon the condition, your plan could have included both over the counter or prescription medications. ? ?Please review your pharmacy choice. Make sure the pharmacy is open so you can pick up prescription now. If there is a problem, you may contact your provider through [employer] and have the prescription routed to another pharmacy.  Your safety is important to [place]. If you have drug allergies check your prescription carefully.  ? ?For the next 24 hours you can use MyChart to ask questions about today's visit, request a non-urgent call back, or ask for a work or school excuse. ?You will get an email in the next two days asking about your experience. I hope that your e-visit has been valuable and will speed your recovery. ? ? ?  I provided 5 minutes of non face-to-face time during this encounter for chart review, medication and order placement, as well as and documentation.  ? ?

## 2021-11-02 ENCOUNTER — Other Ambulatory Visit (HOSPITAL_COMMUNITY): Payer: Self-pay

## 2021-11-02 DIAGNOSIS — Z903 Acquired absence of stomach [part of]: Secondary | ICD-10-CM | POA: Diagnosis not present

## 2021-11-02 DIAGNOSIS — I10 Essential (primary) hypertension: Secondary | ICD-10-CM | POA: Diagnosis not present

## 2021-11-02 DIAGNOSIS — Z6841 Body Mass Index (BMI) 40.0 and over, adult: Secondary | ICD-10-CM | POA: Diagnosis not present

## 2021-11-02 MED ORDER — CONTRAVE 8-90 MG PO TB12
ORAL_TABLET | ORAL | 1 refills | Status: AC
  Filled 2021-11-02 – 2021-11-04 (×2): qty 120, 30d supply, fill #0
  Filled 2021-11-04: qty 78, 30d supply, fill #0
  Filled 2021-11-30: qty 78, 30d supply, fill #1
  Filled 2021-12-01 – 2021-12-07 (×4): qty 120, 30d supply, fill #1

## 2021-11-03 ENCOUNTER — Other Ambulatory Visit (HOSPITAL_COMMUNITY): Payer: Self-pay

## 2021-11-04 ENCOUNTER — Other Ambulatory Visit (HOSPITAL_COMMUNITY): Payer: Self-pay

## 2021-11-30 ENCOUNTER — Other Ambulatory Visit (HOSPITAL_COMMUNITY): Payer: Self-pay

## 2021-12-01 ENCOUNTER — Other Ambulatory Visit (HOSPITAL_COMMUNITY): Payer: Self-pay

## 2021-12-03 ENCOUNTER — Other Ambulatory Visit (HOSPITAL_COMMUNITY): Payer: Self-pay

## 2021-12-07 ENCOUNTER — Other Ambulatory Visit (HOSPITAL_COMMUNITY): Payer: Self-pay

## 2021-12-14 ENCOUNTER — Other Ambulatory Visit (HOSPITAL_COMMUNITY): Payer: Self-pay

## 2021-12-14 DIAGNOSIS — H11131 Conjunctival pigmentations, right eye: Secondary | ICD-10-CM | POA: Diagnosis not present

## 2021-12-14 DIAGNOSIS — H5213 Myopia, bilateral: Secondary | ICD-10-CM | POA: Diagnosis not present

## 2021-12-15 ENCOUNTER — Other Ambulatory Visit (HOSPITAL_COMMUNITY): Payer: Self-pay

## 2022-01-05 ENCOUNTER — Other Ambulatory Visit (HOSPITAL_COMMUNITY): Payer: Self-pay

## 2022-01-05 DIAGNOSIS — R634 Abnormal weight loss: Secondary | ICD-10-CM | POA: Diagnosis not present

## 2022-01-05 DIAGNOSIS — F439 Reaction to severe stress, unspecified: Secondary | ICD-10-CM | POA: Diagnosis not present

## 2022-01-05 DIAGNOSIS — Z6841 Body Mass Index (BMI) 40.0 and over, adult: Secondary | ICD-10-CM | POA: Diagnosis not present

## 2022-01-05 DIAGNOSIS — Z903 Acquired absence of stomach [part of]: Secondary | ICD-10-CM | POA: Diagnosis not present

## 2022-01-05 MED ORDER — CONTRAVE 8-90 MG PO TB12
1.0000 | ORAL_TABLET | Freq: Every morning | ORAL | 1 refills | Status: DC
  Filled 2022-01-05: qty 120, 30d supply, fill #0
  Filled 2022-02-25: qty 120, 30d supply, fill #1

## 2022-01-15 ENCOUNTER — Other Ambulatory Visit (HOSPITAL_COMMUNITY): Payer: Self-pay

## 2022-01-15 MED ORDER — LINZESS 290 MCG PO CAPS
290.0000 ug | ORAL_CAPSULE | Freq: Every day | ORAL | 0 refills | Status: DC
  Filled 2022-01-15: qty 90, 90d supply, fill #0

## 2022-01-18 ENCOUNTER — Other Ambulatory Visit (HOSPITAL_COMMUNITY): Payer: Self-pay

## 2022-02-04 ENCOUNTER — Encounter (HOSPITAL_COMMUNITY): Payer: Self-pay | Admitting: Emergency Medicine

## 2022-02-04 ENCOUNTER — Other Ambulatory Visit (HOSPITAL_COMMUNITY): Payer: Self-pay

## 2022-02-04 ENCOUNTER — Ambulatory Visit (HOSPITAL_COMMUNITY)
Admission: EM | Admit: 2022-02-04 | Discharge: 2022-02-04 | Disposition: A | Discharge status: Home / Self Care | Payer: 59 | Attending: Physician Assistant | Admitting: Physician Assistant

## 2022-02-04 ENCOUNTER — Other Ambulatory Visit: Payer: Self-pay

## 2022-02-04 DIAGNOSIS — L299 Pruritus, unspecified: Secondary | ICD-10-CM | POA: Diagnosis not present

## 2022-02-04 DIAGNOSIS — L309 Dermatitis, unspecified: Secondary | ICD-10-CM | POA: Diagnosis not present

## 2022-02-04 MED ORDER — TRIAMCINOLONE ACETONIDE 0.1 % EX CREA
1.0000 | TOPICAL_CREAM | Freq: Two times a day (BID) | CUTANEOUS | 0 refills | Status: DC
  Filled 2022-02-04: qty 30, 15d supply, fill #0

## 2022-02-04 NOTE — Discharge Instructions (Addendum)
Advised to use the triamcinolone to the area 3 times a day and see if it responds over the next several days. Advised to return if symptoms fail to improve, consider alternative therapy.

## 2022-02-04 NOTE — ED Provider Notes (Signed)
[facility]    CSN: [id number] Arrival date & time: 02/04/22  1234      History   Chief Complaint Chief Complaint  Patient presents with   Insect Bite    HPI Natasha Rice is a 36 y.o. female.   36 year old female presents with bump on the right forearm.  Patient relates over the past several days she is had a bump that started on the right mid forearm that was small and red.  This area has enlarged over the past couple days has became pustular, with mild redness and is itching.  Patient does not recall being bitten by any insects, and relates that her son has ringworm on the back of his neck.  Patient relates that the area has minimal redness around it there is no drainage or streaking.     Past Medical History:  Diagnosis Date   Anxiety    Depression    GERD (gastroesophageal reflux disease)    Hypertension    Polycystic ovarian disease    Seasonal allergies     There are no problems to display for this patient.   Past Surgical History:  Procedure Laterality Date   CESAREAN SECTION     TONSILLECTOMY      OB History   No obstetric history on file.      Home Medications    Prior to Admission medications   Medication Sig Start Date End Date Taking? Authorizing Provider  triamcinolone cream (KENALOG) 0.1 % Apply 1 Application topically 2 (two) times daily. 02/04/22  Yes Nyoka Lint, PA-C  benzonatate (TESSALON) 100 MG capsule Take 1 capsule (100 mg total) by mouth 2 (two) times daily as needed for cough. 10/22/21   Perlie Mayo, NP  COVID-19 At Home Antigen Test [facility] COVID-19 HOME TEST) KIT Use as directed 10/21/21   Jefm Bryant, RPH  fluticasone [facility]) 50 MCG/ACT nasal spray Place 2 sprays into both nostrils daily. 03/19/21   Perlie Mayo, NP  HYDROcodone-acetaminophen (NORCO) 5-325 MG tablet Take 2 tablets by mouth every 4 (four) hours as needed (for pain). 08/16/15   Molpus, John, MD  ibuprofen (ADVIL) 800 MG tablet Take 1  tablet (800 mg total) by mouth every 8 (eight) hours as needed. 09/30/21     ipratropium (ATROVENT) 0.03 % nasal spray Place 2 sprays into both nostrils every 12 (twelve) hours. 09/15/21   Mar Daring, PA-C  labetalol (NORMODYNE) 100 MG tablet Take 1 tablet (100 mg total) by mouth 2 (two) times daily. 03/28/15   Micheline Chapman, NP  linaclotide (LINZESS) 290 MCG CAPS capsule Take 1 capsule (290 mcg total) by mouth daily. 01/15/22     loratadine (CLARITIN) 10 MG tablet Take 10 mg by mouth daily.    [provider]  Multiple Vitamins-Minerals (ALIVE WOMENS ENERGY) TABS Take 1 tablet by mouth daily.      [provider]  Naltrexone-buPROPion HCl ER (CONTRAVE) 8-90 MG TB12 Take 1 tab by mouth in the am for 1 week, 1 tab 2 times a day for 1 week,2 tabs in am & 1 tab in pm for 1 week,then 2 tabs 2 times a day 01/05/22     norethindrone (MICRONOR,CAMILA,ERRIN) 0.35 MG tablet Take 1 tablet by mouth daily.    [provider]  sertraline (ZOLOFT) 50 MG tablet TAKE 1 TABLET BY MOUTH ONCE DAILY. 06/03/20 06/03/21  Karie Mainland, FNP  valACYclovir (VALTREX) 500 MG tablet Take 1 tablet (500 mg total) by mouth  2 (two) times daily for 3 days as needed 07/13/21     Vitamin D, Ergocalciferol, (DRISDOL) 1.25 MG (50000 UNIT) CAPS capsule Take 1 capsule (50,000 Units total) by mouth once a week. 07/31/21     diphenhydrAMINE (BENADRYL) 25 MG tablet Take 1 tablet (25 mg total) by mouth every 6 (six) hours. Patient not taking: Reported on 03/28/2015 06/24/13 08/16/15  Charlann Lange, PA-C  etonogestrel-ethinyl estradiol (NUVARING) 0.12-0.015 MG/24HR vaginal ring Insert 1 ring vaginally and leave in place for 3 consecutive weeks, then remove for 1 week. 05/08/21 10/01/21    metFORMIN (GLUCOPHAGE-XR) 500 MG 24 hr tablet Take 2 tablets (1,000 mg total) by mouth daily after supper. 03/28/15 08/16/15  Micheline Chapman, NP    Family History Family History  Problem Relation Age of Onset   Diabetes  Father    Hypertension Father    Hyperlipidemia Father    Hypertension Maternal Grandmother     Social History Social History   Tobacco Use   Smoking status: Former  Scientific laboratory technician Use: Never used  Substance Use Topics   Alcohol use: Yes    Comment: Occ   Drug use: No     Allergies   Patient has no known allergies.   Review of Systems Review of Systems  Skin:  Positive for rash (red bump on right forearm).     Physical Exam Triage Vital Signs ED Triage Vitals  Enc Vitals Group     BP 02/04/22 1429 121/83     Pulse Rate 02/04/22 1429 67     Resp 02/04/22 1429 (!) 22     Temp 02/04/22 1429 98.4 F (36.9 C)     Temp Source 02/04/22 1429 Oral     SpO2 02/04/22 1429 98 %     Weight --      Height --      Head Circumference --      Peak Flow --      Pain Score 02/04/22 1426 6     Pain Loc --      Pain Edu? --      Excl. in [place]? --    No data found.  Updated Vital Signs BP 121/83 (BP Location: Left Arm) Comment (BP Location): large cuff  Pulse 67   Temp 98.4 F (36.9 C) (Oral)   Resp (!) 22   LMP 01/10/2022   SpO2 98%   Visual Acuity Right Eye Distance:   Left Eye Distance:   Bilateral Distance:    Right Eye Near:   Left Eye Near:    Bilateral Near:     Physical Exam Constitutional:      Appearance: Normal appearance.  Skin:         Comments: Right forearm: Mid forearm there is a circular slightly raised red pustular rash, no surrounding redness to the outside borders, no drainage.  There is no crusting to the area.  Neurological:     Mental Status: She is alert.      UC Treatments / Results  Labs (all labs ordered are listed, but only abnormal results are displayed) Labs Reviewed - No data to display  EKG   Radiology No results found.  Procedures Procedures (including critical care time)  Medications Ordered in UC Medications - No data to display  Initial Impression / Assessment and Plan / UC Course  I have reviewed  the triage vital signs and the nursing notes.  Pertinent labs & imaging results that were available during  my care of the patient were reviewed by me and considered in my medical decision making (see chart for details).    Plan: 1.  Advised to use triamcinolone cream to the area 3 times a day over the next several days to see if it reduces the itching and the rash.  (Consider switching to Bactroban ointment if not improved) 2.  Advised to follow-up PCP or return to urgent care if symptoms fail to improve. Final Clinical Impressions(s) / UC Diagnoses   Final diagnoses:  Dermatitis  Itching     Discharge Instructions      Advised to use the triamcinolone to the area 3 times a day and see if it responds over the next several days. Advised to return if symptoms fail to improve, consider alternative therapy.    ED Prescriptions     Medication Sig Dispense Auth. Provider   triamcinolone cream (KENALOG) 0.1 % Apply 1 Application topically 2 (two) times daily. 30 g Nyoka Lint, PA-C      PDMP not reviewed this encounter.   Nyoka Lint, PA-C 02/04/22 1454

## 2022-02-04 NOTE — ED Triage Notes (Addendum)
Initially thought bump on right forearm was an insect bite.  As week has progressed, bump has changed.  Larger, red, clear drainage, itching, warm to touch  Circular blisters, son has ring worm

## 2022-02-05 ENCOUNTER — Telehealth: Payer: 59 | Admitting: Physician Assistant

## 2022-02-05 DIAGNOSIS — T63421D Toxic effect of venom of ants, accidental (unintentional), subsequent encounter: Secondary | ICD-10-CM

## 2022-02-05 MED ORDER — CEPHALEXIN 500 MG PO CAPS: 500.0000 mg | ORAL_CAPSULE | Freq: Three times a day (TID) | ORAL | 0 refills | Status: AC

## 2022-02-05 NOTE — Progress Notes (Signed)
I have spent 5 minutes in review of e-visit questionnaire, review and updating patient chart, medical decision making and response to patient.   Dmario Russom Cody Quindarrius [place], PA-C    

## 2022-02-05 NOTE — Progress Notes (Signed)
E-Visit for Insect Sting  Thank you for describing the insect sting for [place].  Here is how we plan to help!  It appears that there may be a mild infection of the surrounding skin starting (cellulitis) due to the puncture of the skin during the fire ant bite. It is very common for these bites to bubble/blister up. Try to avoid popping the area. Keep applying cream given at your urgent care visit yesterday. I am adding on an oral antibiotic to take as directed. You can also take OTC Benadryl to help with itch and inflammation. IF anything continues to worsen, you need a repeat evaluation in person.   The 2 greatest risks from insect stings are allergic reaction, which can be fatal in some people and infection, which is more common and less serious.  Bees, wasps, yellow jackets, and hornets belong to a class of insects called Hymenoptera.  Most insect stings cause only minor discomfort.  Stings can happen anywhere on the body and can be painful.  Most stings are from honey bees or yellow jackets.  Fire ants can sting multiple times.  The sites of the stings are more likely to become infected.     What can be used to prevent Insect Stings?  Insect repellant with at least 20% DEET.  Wearing long pants and shirts with socks and shoes.  Wear dark or drab-colored clothes rather than bright colors.  Avoid using perfumes and hair sprays; these attract insects.  HOME CARE ADVICE:  1. Stinger removal: The stinger looks like a tiny black dot in the sting. Use a fingernail, credit card edge, or knife-edge to scrape it off.  Don't pull it out because it squeezes out more venom. If the stinger is below the skin surface, leave it alone.  It will be shed with normal skin healing. 2. Use cold compresses to the area of the sting for 10-20 minutes.  You may repeat this as needed to relieve symptoms of pain and swelling. 3.  For pain relief, take acetominophen 650 mg 4-6 hours as needed or ibuprofen 400 mg every  6-8 hours as needed or naproxen 250-500 mg every 12 hours as needed. 4.  You can also use hydrocortisone cream 0.5% or 1% up to 4 times daily as needed for itching. 5.  If the sting becomes very itchy, take Benadryl 25-50 mg, follow directions on box. 6.  Wash the area 2-3 times daily with antibacterial soap and warm water. 7. Call your Doctor if: Fever, a severe headache, or rash occur in the next 2 weeks. Sting area begins to look infected. Redness and swelling worsens after home treatment. Your current symptoms become worse.    MAKE SURE YOU:  Understand these instructions. Will watch your condition. Will get help right away if you are not doing well or get worse.  Thank you for choosing an e-visit.  Your e-visit answers were reviewed by a board certified advanced clinical practitioner to complete your personal care plan. Depending upon the condition, your plan could have included both over the counter or prescription medications.  Please review your pharmacy choice. Make sure the pharmacy is open so you can pick up prescription now. If there is a problem, you may contact your provider through [employer] and have the prescription routed to another pharmacy.  Your safety is important to [place]. If you have drug allergies check your prescription carefully.   For the next 24 hours you can use MyChart to ask questions about  today's visit, request a non-urgent call back, or ask for a work or school excuse. You will get an email in the next two days asking about your experience. I hope that your e-visit has been valuable and will speed your recovery.

## 2022-02-11 DIAGNOSIS — N970 Female infertility associated with anovulation: Secondary | ICD-10-CM | POA: Diagnosis not present

## 2022-02-12 DIAGNOSIS — D508 Other iron deficiency anemias: Secondary | ICD-10-CM | POA: Diagnosis not present

## 2022-02-15 ENCOUNTER — Ambulatory Visit (INDEPENDENT_AMBULATORY_CARE_PROVIDER_SITE_OTHER): Payer: 59 | Admitting: Family Medicine

## 2022-02-15 ENCOUNTER — Encounter: Payer: Self-pay | Admitting: Family Medicine

## 2022-02-15 ENCOUNTER — Other Ambulatory Visit (HOSPITAL_COMMUNITY): Payer: Self-pay

## 2022-02-15 VITALS — BP 134/86 | HR 102 | Ht 61.5 in | Wt 221.4 lb

## 2022-02-15 DIAGNOSIS — E559 Vitamin D deficiency, unspecified: Secondary | ICD-10-CM | POA: Insufficient documentation

## 2022-02-15 DIAGNOSIS — D649 Anemia, unspecified: Secondary | ICD-10-CM | POA: Insufficient documentation

## 2022-02-15 DIAGNOSIS — F419 Anxiety disorder, unspecified: Secondary | ICD-10-CM | POA: Diagnosis not present

## 2022-02-15 DIAGNOSIS — D509 Iron deficiency anemia, unspecified: Secondary | ICD-10-CM

## 2022-02-15 DIAGNOSIS — K219 Gastro-esophageal reflux disease without esophagitis: Secondary | ICD-10-CM | POA: Diagnosis not present

## 2022-02-15 DIAGNOSIS — K5909 Other constipation: Secondary | ICD-10-CM | POA: Diagnosis not present

## 2022-02-15 DIAGNOSIS — K5904 Chronic idiopathic constipation: Secondary | ICD-10-CM | POA: Diagnosis not present

## 2022-02-15 DIAGNOSIS — F32A Depression, unspecified: Secondary | ICD-10-CM | POA: Diagnosis not present

## 2022-02-15 DIAGNOSIS — R790 Abnormal level of blood mineral: Secondary | ICD-10-CM | POA: Diagnosis not present

## 2022-02-15 DIAGNOSIS — Z903 Acquired absence of stomach [part of]: Secondary | ICD-10-CM | POA: Diagnosis not present

## 2022-02-15 MED ORDER — LINZESS 290 MCG PO CAPS
290.0000 ug | ORAL_CAPSULE | Freq: Every day | ORAL | 3 refills | Status: DC
  Filled 2022-02-15 – 2022-03-05 (×2): qty 90, 90d supply, fill #0
  Filled 2022-10-18: qty 90, 90d supply, fill #1
  Filled 2023-01-11: qty 90, 90d supply, fill #2

## 2022-02-15 MED ORDER — VENLAFAXINE HCL ER 37.5 MG PO CP24
37.5000 mg | ORAL_CAPSULE | Freq: Every day | ORAL | 3 refills | Status: DC
  Filled 2022-02-15: qty 30, 30d supply, fill #0
  Filled 2022-03-05 – 2022-03-12 (×2): qty 30, 30d supply, fill #1
  Filled 2022-04-11: qty 30, 30d supply, fill #2

## 2022-02-15 NOTE — Assessment & Plan Note (Signed)
No SI/HI Discussed options with patient. She would like to try medication. Open to trying Effexor - information discussed and provided.  She is already doing counseling.  F/u in 4-6 weeks.

## 2022-02-15 NOTE — Patient Instructions (Signed)
Taking the medicine as directed and not missing any doses is one of the best things you can do to treat your anxiety/depression.  Here are some things to keep in mind: Side effects (stomach upset, some increased anxiety) may happen before you notice a benefit.  These side effects typically go away over time. Changes to your dose of medicine or a change in medication all together is sometimes necessary Many people will notice an improvement within two weeks but the full effect of the medication can take up to 4-6 weeks Stopping the medication when you start feeling better often results in a return of symptoms. Most people need to be on medication at least 6-12 months If you start having thoughts of hurting yourself or others after starting this medicine, please call me immediately.    

## 2022-02-15 NOTE — Progress Notes (Signed)
New Patient Office Visit  Subjective    Patient ID: Natasha Rice, female    DOB: Feb 15, 1986  Age: 36 y.o. MRN: [id number]  CC: establish care, depression/anxiety   HPI Natasha Rice presents to establish care. 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 She was previously following with [place] for PCP.    Chronic constipation, GERD: - Linzess 290 mcg - working really well  - Follows with GI  IDA: - Follows with hematology for infusions - Reports stable labs recently  - No new symptoms currently   Vitamin D Deficiency: - 50,000 units/week supplementation (has been on this dose for 4+ years) - Reports recent labs with Natasha Rice, will request records    Anxiety/Depression: - a lot of deaths in the family lately, care-giving for grandmother with dementia - feels like she is getting depressed with everything going on  - No SI/HI - she has tried Zoloft (felt like a zombie), citalopram (made her eat too much), Wellbutrin (worked well, but previous provider didn't let her stay on it for long) - she is already set up with counseling/therapy    Obesity: - Follows with [facility] bariatric  - Contrave working decently       02/15/2022    4:20 PM 03/28/2015    2:04 PM  PHQ9 SCORE ONLY  PHQ-9 Total Score 19 0      02/15/2022    4:20 PM  GAD 7 : Generalized Anxiety Score  Nervous, Anxious, on Edge 3  Control/stop worrying 3  Worry too much - different things 3  Trouble relaxing 3  Restless 2  Easily annoyed or irritable 2  Afraid - awful might happen 2  Total GAD 7 Score 18  Anxiety Difficulty Very difficult      Outpatient Encounter Medications as of 02/15/2022  Medication Sig   fluticasone (FLONASE) 50 MCG/ACT nasal spray Place 2 sprays into both nostrils daily.   linaclotide (LINZESS) 290 MCG CAPS capsule TAKE 1 CAPSULE BY MOUTH ONCE A DAY   Naltrexone-buPROPion HCl ER (CONTRAVE) 8-90 MG TB12 Take 1 tab by mouth in the am for 1 week, 1 tab 2 times  a day for 1 week,2 tabs in am & 1 tab in pm for 1 week,then 2 tabs 2 times a day   valACYclovir (VALTREX) 500 MG tablet Take 1 tablet (500 mg total) by mouth 2 (two) times daily for 3 days as needed   venlafaxine XR (EFFEXOR XR) 37.5 MG 24 hr capsule Take 1 capsule (37.5 mg total) by mouth daily with breakfast.   Vitamin D, Ergocalciferol, (DRISDOL) 1.25 MG (50000 UNIT) CAPS capsule Take 1 capsule (50,000 Units total) by mouth once a week.   triamcinolone cream (KENALOG) 0.1 % Apply 1 Application topically 2 (two) times daily.   [DISCONTINUED] benzonatate (TESSALON) 100 MG capsule Take 1 capsule (100 mg total) by mouth 2 (two) times daily as needed for cough.   [DISCONTINUED] COVID-19 At Home Antigen Test (CARESTART COVID-19 HOME TEST) KIT Use as directed   [DISCONTINUED] diphenhydrAMINE (BENADRYL) 25 MG tablet Take 1 tablet (25 mg total) by mouth every 6 (six) hours. (Patient not taking: Reported on 03/28/2015)   [DISCONTINUED] etonogestrel-ethinyl estradiol (NUVARING) 0.12-0.015 MG/24HR vaginal ring Insert 1 ring vaginally and leave in place for 3 consecutive weeks, then remove for 1 week.   [DISCONTINUED] HYDROcodone-acetaminophen (NORCO) 5-325 MG tablet Take 2 tablets by mouth every 4 (four) hours as needed (for pain).   [DISCONTINUED] ibuprofen (ADVIL) 800 MG  tablet Take 1 tablet (800 mg total) by mouth every 8 (eight) hours as needed.   [DISCONTINUED] ipratropium (ATROVENT) 0.03 % nasal spray Place 2 sprays into both nostrils every 12 (twelve) hours.   [DISCONTINUED] labetalol (NORMODYNE) 100 MG tablet Take 1 tablet (100 mg total) by mouth 2 (two) times daily.   [DISCONTINUED] linaclotide (LINZESS) 290 MCG CAPS capsule Take 1 capsule (290 mcg total) by mouth daily.   [DISCONTINUED] loratadine (CLARITIN) 10 MG tablet Take 10 mg by mouth daily.   [DISCONTINUED] metFORMIN (GLUCOPHAGE-XR) 500 MG 24 hr tablet Take 2 tablets (1,000 mg total) by mouth daily after supper.   [DISCONTINUED] Multiple  Vitamins-Minerals (ALIVE WOMENS ENERGY) TABS Take 1 tablet by mouth daily.     [DISCONTINUED] norethindrone (MICRONOR,CAMILA,ERRIN) 0.35 MG tablet Take 1 tablet by mouth daily.   [DISCONTINUED] sertraline (ZOLOFT) 50 MG tablet TAKE 1 TABLET BY MOUTH ONCE DAILY.   No facility-administered encounter medications on file as of 02/15/2022.    Past Medical History:  Diagnosis Date   Allergy    Anemia    Anxiety    Depression    GERD (gastroesophageal reflux disease)    Hypertension    Polycystic ovarian disease    Seasonal allergies    Sleep apnea     Past Surgical History:  Procedure Laterality Date   CESAREAN SECTION     GASTRIC BYPASS     TONSILLECTOMY      Family History  Problem Relation Age of Onset   Stroke Mother    Heart disease Mother    Drug abuse Mother    Alcohol abuse Mother    Drug abuse Father    Diabetes Father    Hypertension Father    Hyperlipidemia Father    Miscarriages / Stillbirths Sister    Hypertension Sister    Early death Sister    Congestive Heart Failure Son    Intellectual disability Maternal Grandmother    Arthritis Maternal Grandmother    Hypertension Maternal Grandmother    Intellectual disability Maternal Grandfather    Hypertension Maternal Grandfather    COPD Maternal Grandfather    Alcohol abuse Maternal Grandfather    Heart attack Maternal Grandfather    Diabetes Paternal Grandmother    Arthritis Paternal Grandmother    Hypertension Paternal Grandmother    Intellectual disability Paternal Grandmother    Dementia Paternal Grandmother    Alcohol abuse Paternal Grandfather    Intellectual disability Paternal Grandfather     Social History   Socioeconomic History   Marital status: Single    Spouse name: Not on file   Number of children: Not on file   Years of education: Not on file   Highest education level: Not on file  Occupational History   Not on file  Tobacco Use   Smoking status: Never   Smokeless tobacco: Never   Vaping Use   Vaping Use: Never used  Substance and Sexual Activity   Alcohol use: Yes    Alcohol/week: 2.0 standard drinks of alcohol    Types: 2 Glasses of wine per week    Comment: Occ   Drug use: No   Sexual activity: Yes    Birth control/protection: Condom  Other Topics Concern   Not on file  Social History Narrative   Not on file   Social Determinants of Health   Financial Resource Strain: Not on file  Food Insecurity: Not on file  Transportation Needs: Not on file  Physical Activity: Not on file  Stress:  Not on file  Social Connections: Not on file  Intimate Partner Violence: Not on file    ROS All review of systems negative except what is listed in the HPI      Objective    BP 134/86   Pulse (!) 102   Ht 5' 1.5" (1.562 m)   Wt 221 lb 6.4 oz (100.4 kg)   LMP 01/10/2022   BMI 41.16 kg/m   Physical Exam Vitals reviewed.  Constitutional:      General: She is not in acute distress.    Appearance: Normal appearance. She is obese. She is not ill-appearing.  Cardiovascular:     Rate and Rhythm: Normal rate and regular rhythm.  Pulmonary:     Effort: Pulmonary effort is normal.     Breath sounds: Normal breath sounds.  Musculoskeletal:     Right lower leg: No edema.     Left lower leg: No edema.  Skin:    Capillary Refill: Capillary refill takes less than 2 seconds.  Neurological:     General: No focal deficit present.     Mental Status: She is alert and oriented to person, place, and time. Mental status is at baseline.  Psychiatric:        Mood and Affect: Mood normal.        Behavior: Behavior normal.        Thought Content: Thought content normal.        Judgment: Judgment normal.           Assessment & Plan:   Problem List Items Addressed This Visit       Other   Anemia    Stable per patient - requesting labs.  Following with hematology.  Patient aware of signs/symptoms requiring further/urgent evaluation.       Anxiety and  depression - Primary    No SI/HI Discussed options with patient. She would like to try medication. Open to trying Effexor - information discussed and provided.  She is already doing counseling.  F/u in 4-6 weeks.       Relevant Medications   venlafaxine XR (EFFEXOR XR) 37.5 MG 24 hr capsule   Morbid obesity (HCC)    Reports stable labs. Requesting records.  Discussed healthy diet and exercise.       Vitamin D deficiency    Stable on high-dose weekly supplementation per patient. States recent labs. Will request records.         Return for physical at your convenience; mood f/u 6 weeks.   Terrilyn Saver, NP

## 2022-02-15 NOTE — Assessment & Plan Note (Signed)
Stable per patient - requesting labs.  Following with hematology.  Patient aware of signs/symptoms requiring further/urgent evaluation.

## 2022-02-15 NOTE — Assessment & Plan Note (Signed)
Reports stable labs. Requesting records.  Discussed healthy diet and exercise.

## 2022-02-15 NOTE — Assessment & Plan Note (Signed)
Stable on high-dose weekly supplementation per patient. States recent labs. Will request records.

## 2022-02-24 ENCOUNTER — Encounter: Payer: Self-pay | Admitting: *Deleted

## 2022-02-25 ENCOUNTER — Other Ambulatory Visit (HOSPITAL_COMMUNITY): Payer: Self-pay

## 2022-02-26 ENCOUNTER — Other Ambulatory Visit (HOSPITAL_COMMUNITY): Payer: Self-pay

## 2022-03-01 DIAGNOSIS — Z0189 Encounter for other specified special examinations: Secondary | ICD-10-CM | POA: Diagnosis not present

## 2022-03-01 DIAGNOSIS — N970 Female infertility associated with anovulation: Secondary | ICD-10-CM | POA: Diagnosis not present

## 2022-03-05 ENCOUNTER — Other Ambulatory Visit (HOSPITAL_COMMUNITY): Payer: Self-pay

## 2022-03-09 ENCOUNTER — Other Ambulatory Visit (HOSPITAL_COMMUNITY): Payer: Self-pay

## 2022-03-12 ENCOUNTER — Other Ambulatory Visit (HOSPITAL_COMMUNITY): Payer: Self-pay

## 2022-03-19 ENCOUNTER — Ambulatory Visit (INDEPENDENT_AMBULATORY_CARE_PROVIDER_SITE_OTHER): Payer: 59 | Admitting: Family

## 2022-03-19 VITALS — BP 138/90 | HR 95 | Resp 18 | Ht 61.5 in | Wt 213.5 lb

## 2022-03-19 DIAGNOSIS — Z13220 Encounter for screening for lipoid disorders: Secondary | ICD-10-CM

## 2022-03-19 DIAGNOSIS — Z0000 Encounter for general adult medical examination without abnormal findings: Secondary | ICD-10-CM

## 2022-03-19 LAB — CBC WITH DIFFERENTIAL/PLATELET
Basophils Absolute: 0 10*3/uL (ref 0.0–0.1)
Basophils Relative: 0.7 % (ref 0.0–3.0)
Eosinophils Absolute: 0 10*3/uL (ref 0.0–0.7)
Eosinophils Relative: 0.9 % (ref 0.0–5.0)
HCT: 40.9 % (ref 36.0–46.0)
Hemoglobin: 12.8 g/dL (ref 12.0–15.0)
Lymphocytes Relative: 34.7 % (ref 12.0–46.0)
Lymphs Abs: 1.7 10*3/uL (ref 0.7–4.0)
MCHC: 31.4 g/dL (ref 30.0–36.0)
MCV: 73.4 fl — ABNORMAL LOW (ref 78.0–100.0)
Monocytes Absolute: 0.5 10*3/uL (ref 0.1–1.0)
Monocytes Relative: 10.1 % (ref 3.0–12.0)
Neutro Abs: 2.7 10*3/uL (ref 1.4–7.7)
Neutrophils Relative %: 53.6 % (ref 43.0–77.0)
Platelets: 226 10*3/uL (ref 150.0–400.0)
RBC: 5.57 Mil/uL — ABNORMAL HIGH (ref 3.87–5.11)
RDW: 14.2 % (ref 11.5–15.5)
WBC: 5 10*3/uL (ref 4.0–10.5)

## 2022-03-19 LAB — LIPID PANEL
Cholesterol: 204 mg/dL — ABNORMAL HIGH (ref 0–200)
HDL: 72.6 mg/dL (ref >39.00)
LDL Cholesterol: 117 mg/dL — ABNORMAL HIGH (ref 0–99)
NonHDL: 131.59
Total CHOL/HDL Ratio: 3
Triglycerides: 71 mg/dL (ref 0.0–149.0)
VLDL: 14.2 mg/dL (ref 0.0–40.0)

## 2022-03-19 LAB — COMPREHENSIVE METABOLIC PANEL
ALT: 12 U/L (ref 0–35)
AST: 15 U/L (ref 0–37)
Albumin: 4.3 g/dL (ref 3.5–5.2)
Alkaline Phosphatase: 77 U/L (ref 39–117)
BUN: 11 mg/dL (ref 6–23)
CO2: 29 mEq/L (ref 19–32)
Calcium: 9.2 mg/dL (ref 8.4–10.5)
Chloride: 100 mEq/L (ref 96–112)
Creatinine, Ser: 0.99 mg/dL (ref 0.40–1.20)
GFR: 73.62 mL/min (ref >60.00)
Glucose, Bld: 89 mg/dL (ref 70–99)
Potassium: 4.6 mEq/L (ref 3.5–5.1)
Sodium: 135 mEq/L (ref 135–145)
Total Bilirubin: 0.7 mg/dL (ref 0.2–1.2)
Total Protein: 7.3 g/dL (ref 6.0–8.3)

## 2022-03-19 NOTE — Progress Notes (Signed)
Natasha Rice is a 36 y.o. female with the following history as recorded in Epic[facility]:  Patient Active Problem List   Diagnosis Date Noted   Anemia 02/15/2022   Anxiety and depression 02/15/2022   Morbid obesity ([place]) 02/15/2022   Vitamin D deficiency 02/15/2022    Current Outpatient Medications  Medication Sig Dispense Refill   Vitamin D, Ergocalciferol, (DRISDOL) 1.25 MG (50000 UNIT) CAPS capsule Take 1 capsule (50,000 Units total) by mouth once a week. 12 capsule 11   linaclotide (LINZESS) 290 MCG CAPS capsule Take 1 capsule (290 mcg total) by mouth daily. 90 capsule 3   Naltrexone-buPROPion HCl ER (CONTRAVE) 8-90 MG TB12 Take 1 tab by mouth in the am for 1 week, 1 tab 2 times a day for 1 week,2 tabs in am & 1 tab in pm for 1 week,then 2 tabs 2 times a day 120 tablet 1   triamcinolone cream (KENALOG) 0.1 % Apply 1 Application topically 2 (two) times daily. 30 g 0   valACYclovir (VALTREX) 500 MG tablet Take 1 tablet (500 mg total) by mouth 2 (two) times daily for 3 days as needed 30 tablet 5   venlafaxine XR (EFFEXOR XR) 37.5 MG 24 hr capsule Take 1 capsule (37.5 mg total) by mouth daily with breakfast. 30 capsule 3   No current facility-administered medications for this visit.    Allergies: Patient has no known allergies.  Past Medical History:  Diagnosis Date   Allergy    Anemia    Anxiety    Depression    GERD (gastroesophageal reflux disease)    Hypertension    Polycystic ovarian disease    Seasonal allergies    Sleep apnea     Past Surgical History:  Procedure Laterality Date   CESAREAN SECTION     GASTRIC BYPASS     TONSILLECTOMY      Family History  Problem Relation Age of Onset   Stroke Mother    Heart disease Mother    Drug abuse Mother    Alcohol abuse Mother    Drug abuse Father    Diabetes Father    Hypertension Father    Hyperlipidemia Father    Miscarriages / Stillbirths Sister    Hypertension Sister    Early death Sister    Congestive Heart  Failure Son    Intellectual disability Maternal Grandmother    Arthritis Maternal Grandmother    Hypertension Maternal Grandmother    Intellectual disability Maternal Grandfather    Hypertension Maternal Grandfather    COPD Maternal Grandfather    Alcohol abuse Maternal Grandfather    Heart attack Maternal Grandfather    Diabetes Paternal Grandmother    Arthritis Paternal Grandmother    Hypertension Paternal Grandmother    Intellectual disability Paternal Grandmother    Dementia Paternal Grandmother    Alcohol abuse Paternal Grandfather    Intellectual disability Paternal Grandfather     Social History   Tobacco Use   Smoking status: Never   Smokeless tobacco: Never  Substance Use Topics   Alcohol use: Yes    Alcohol/week: 2.0 standard drinks of alcohol    Types: 2 Glasses of wine per week    Comment: Occ    Subjective:   Presents for yearly CPE; no acute concerns today;  Does work with GYN, GI and bariatric provider through [place];  Up to date on dental and eye exams;  Is responding well to Effexor;   Review of Systems  Constitutional: Negative.   HENT:  Negative.    Eyes: Negative.   Respiratory: Negative.    Cardiovascular: Negative.   Gastrointestinal: Negative.   Genitourinary: Negative.   Musculoskeletal: Negative.   Skin: Negative.   Neurological: Negative.   Endo/Heme/Allergies: Negative.   Psychiatric/Behavioral: Negative.         Objective:  Vitals:   03/19/22 1305  BP: (!) 138/90  Pulse: 95  Resp: 18  SpO2: 95%  Weight: 213 lb 8 oz (96.8 kg)  Height: 5' 1.5" (1.562 m)    General: Well developed, well nourished, in no acute distress  Skin : Warm and dry.  Head: Normocephalic and atraumatic  Eyes: Sclera and conjunctiva clear; pupils round and reactive to light; extraocular movements intact  Ears: External normal; canals clear; tympanic membranes normal  Oropharynx: Pink, supple. No suspicious lesions  Neck: Supple without thyromegaly,  adenopathy  Lungs: Respirations unlabored; clear to auscultation bilaterally without wheeze, rales, rhonchi  CVS exam: normal rate and regular rhythm.  Abdomen: Soft; nontender; nondistended; normoactive bowel sounds; no masses or hepatosplenomegaly  Musculoskeletal: No deformities; no active joint inflammation  Extremities: No edema, cyanosis, clubbing  Vessels: Symmetric bilaterally  Neurologic: Alert and oriented; speech intact; face symmetrical; moves all extremities well; CNII-XII intact without focal deficit   Assessment:  1. PE (physical exam), annual   2. Lipid screening     Plan:  Age appropriate preventive healthcare needs addressed; encouraged regular eye doctor and dental exams; encouraged regular exercise; will update labs and refills as needed today; follow-up to be determined;   No follow-ups on file.  Orders Placed This Encounter  Procedures   CBC with Differential/Platelet   Comp Met (CMET)   Lipid panel    Requested Prescriptions    No prescriptions requested or ordered in this encounter     

## 2022-03-30 ENCOUNTER — Ambulatory Visit: Payer: 59 | Admitting: Family

## 2022-03-31 ENCOUNTER — Other Ambulatory Visit (HOSPITAL_COMMUNITY): Payer: Self-pay

## 2022-03-31 ENCOUNTER — Telehealth: Payer: 59 | Admitting: Physician Assistant

## 2022-03-31 DIAGNOSIS — B9689 Other specified bacterial agents as the cause of diseases classified elsewhere: Secondary | ICD-10-CM

## 2022-03-31 DIAGNOSIS — J019 Acute sinusitis, unspecified: Secondary | ICD-10-CM | POA: Diagnosis not present

## 2022-03-31 MED ORDER — AMOXICILLIN-POT CLAVULANATE 875-125 MG PO TABS
1.0000 | ORAL_TABLET | Freq: Two times a day (BID) | ORAL | 0 refills | Status: DC
  Filled 2022-03-31: qty 14, 7d supply, fill #0

## 2022-03-31 NOTE — Progress Notes (Signed)

## 2022-04-05 ENCOUNTER — Telehealth: Payer: 59 | Admitting: Physician Assistant

## 2022-04-05 DIAGNOSIS — B379 Candidiasis, unspecified: Secondary | ICD-10-CM

## 2022-04-05 MED ORDER — FLUCONAZOLE 150 MG PO TABS
150.0000 mg | ORAL_TABLET | ORAL | 0 refills | Status: DC | PRN
  Filled 2022-04-05: qty 2, 6d supply, fill #0

## 2022-04-05 NOTE — Progress Notes (Signed)

## 2022-04-06 ENCOUNTER — Other Ambulatory Visit (HOSPITAL_COMMUNITY): Payer: Self-pay

## 2022-04-11 ENCOUNTER — Other Ambulatory Visit (HOSPITAL_COMMUNITY): Payer: Self-pay

## 2022-04-12 ENCOUNTER — Other Ambulatory Visit (HOSPITAL_COMMUNITY): Payer: Self-pay

## 2022-04-12 MED ORDER — CONTRAVE 8-90 MG PO TB12
16.0000 mg | ORAL_TABLET | Freq: Two times a day (BID) | ORAL | 1 refills | Status: DC
  Filled 2022-04-12: qty 120, 30d supply, fill #0

## 2022-04-15 ENCOUNTER — Other Ambulatory Visit (HOSPITAL_COMMUNITY): Payer: Self-pay

## 2022-04-15 ENCOUNTER — Encounter: Payer: Self-pay | Admitting: Family

## 2022-04-15 ENCOUNTER — Other Ambulatory Visit (HOSPITAL_BASED_OUTPATIENT_CLINIC_OR_DEPARTMENT_OTHER): Payer: Self-pay

## 2022-04-15 ENCOUNTER — Other Ambulatory Visit: Payer: Self-pay | Admitting: Family

## 2022-04-15 DIAGNOSIS — F32A Depression, unspecified: Secondary | ICD-10-CM

## 2022-04-15 MED ORDER — VENLAFAXINE HCL ER 75 MG PO CP24
75.0000 mg | ORAL_CAPSULE | Freq: Every day | ORAL | 0 refills | Status: DC
  Filled 2022-04-15 – 2022-04-20 (×2): qty 90, 90d supply, fill #0

## 2022-04-20 ENCOUNTER — Other Ambulatory Visit (HOSPITAL_COMMUNITY): Payer: Self-pay

## 2022-04-20 ENCOUNTER — Other Ambulatory Visit (HOSPITAL_BASED_OUTPATIENT_CLINIC_OR_DEPARTMENT_OTHER): Payer: Self-pay

## 2022-04-21 ENCOUNTER — Other Ambulatory Visit (HOSPITAL_COMMUNITY): Payer: Self-pay

## 2022-04-22 ENCOUNTER — Encounter (HOSPITAL_BASED_OUTPATIENT_CLINIC_OR_DEPARTMENT_OTHER): Payer: Self-pay | Admitting: Emergency Medicine

## 2022-04-22 ENCOUNTER — Emergency Department (HOSPITAL_BASED_OUTPATIENT_CLINIC_OR_DEPARTMENT_OTHER)
Admission: EM | Admit: 2022-04-22 | Discharge: 2022-04-22 | Discharge status: Against Medical Advice | Payer: 59 | Attending: Emergency Medicine | Admitting: Emergency Medicine

## 2022-04-22 ENCOUNTER — Other Ambulatory Visit: Payer: Self-pay

## 2022-04-22 DIAGNOSIS — R109 Unspecified abdominal pain: Secondary | ICD-10-CM | POA: Insufficient documentation

## 2022-04-22 DIAGNOSIS — M545 Low back pain, unspecified: Secondary | ICD-10-CM | POA: Insufficient documentation

## 2022-04-22 DIAGNOSIS — Z5321 Procedure and treatment not carried out due to patient leaving prior to being seen by health care provider: Secondary | ICD-10-CM | POA: Diagnosis not present

## 2022-04-22 LAB — CBC WITH DIFFERENTIAL/PLATELET
Abs Immature Granulocytes: 0.02 10*3/uL (ref 0.00–0.07)
Basophils Absolute: 0 10*3/uL (ref 0.0–0.1)
Basophils Relative: 0 %
Eosinophils Absolute: 0 10*3/uL (ref 0.0–0.5)
Eosinophils Relative: 1 %
HCT: 37.3 % (ref 36.0–46.0)
Hemoglobin: 11.7 g/dL — ABNORMAL LOW (ref 12.0–15.0)
Immature Granulocytes: 0 %
Lymphocytes Relative: 34 %
Lymphs Abs: 2.3 10*3/uL (ref 0.7–4.0)
MCH: 22.9 pg — ABNORMAL LOW (ref 26.0–34.0)
MCHC: 31.4 g/dL (ref 30.0–36.0)
MCV: 72.9 fL — ABNORMAL LOW (ref 80.0–100.0)
Monocytes Absolute: 0.6 10*3/uL (ref 0.1–1.0)
Monocytes Relative: 9 %
Neutro Abs: 3.8 10*3/uL (ref 1.7–7.7)
Neutrophils Relative %: 56 %
Platelets: 230 10*3/uL (ref 150–400)
RBC: 5.12 MIL/uL — ABNORMAL HIGH (ref 3.87–5.11)
RDW: 14.4 % (ref 11.5–15.5)
WBC: 6.7 10*3/uL (ref 4.0–10.5)
nRBC: 0 % (ref 0.0–0.2)

## 2022-04-22 LAB — URINALYSIS, ROUTINE W REFLEX MICROSCOPIC
Bilirubin Urine: NEGATIVE
Glucose, UA: NEGATIVE mg/dL
Hgb urine dipstick: NEGATIVE
Ketones, ur: NEGATIVE mg/dL
Leukocytes,Ua: NEGATIVE
Nitrite: NEGATIVE
Protein, ur: NEGATIVE mg/dL
Specific Gravity, Urine: 1.029 (ref 1.005–1.030)
pH: 6.5 (ref 5.0–8.0)

## 2022-04-22 LAB — BASIC METABOLIC PANEL
Anion gap: 6 (ref 5–15)
BUN: 13 mg/dL (ref 6–20)
CO2: 26 mmol/L (ref 22–32)
Calcium: 9.3 mg/dL (ref 8.9–10.3)
Chloride: 105 mmol/L (ref 98–111)
Creatinine, Ser: 0.81 mg/dL (ref 0.44–1.00)
GFR, Estimated: >60 mL/min (ref >60)
Glucose, Bld: 94 mg/dL (ref 70–99)
Potassium: 4.9 mmol/L (ref 3.5–5.1)
Sodium: 137 mmol/L (ref 135–145)

## 2022-04-22 LAB — HCG, QUANTITATIVE, PREGNANCY: hCG, Beta Chain, Quant, S: 109 m[IU]/mL — ABNORMAL HIGH (ref <5)

## 2022-04-22 LAB — PREGNANCY, URINE: Preg Test, Ur: POSITIVE — AB

## 2022-04-22 NOTE — ED Notes (Addendum)
Spoke to pt several times re: wait time and the EDP had to share lab results with her and she would have to be seen in a room.   Pt upset with the wait time and has decided to leave

## 2022-04-22 NOTE — ED Triage Notes (Signed)
Pt began having abd cramps this afternoon and then began having dark blood. 9/10 lower back pain that radiates to lower abd. Pt found out she was pregnant this morning. LMP 9/6.

## 2022-04-23 ENCOUNTER — Other Ambulatory Visit (HOSPITAL_COMMUNITY): Payer: Self-pay

## 2022-04-23 DIAGNOSIS — O200 Threatened abortion: Secondary | ICD-10-CM | POA: Diagnosis not present

## 2022-04-23 LAB — ABO/RH: ABO/RH(D): O POS

## 2022-04-23 MED ORDER — PROGESTERONE 200 MG PO CAPS
200.0000 mg | ORAL_CAPSULE | Freq: Every day | ORAL | 3 refills | Status: DC
  Filled 2022-04-23: qty 30, 30d supply, fill #0

## 2022-04-25 DIAGNOSIS — O200 Threatened abortion: Secondary | ICD-10-CM | POA: Diagnosis not present

## 2022-04-27 DIAGNOSIS — O200 Threatened abortion: Secondary | ICD-10-CM | POA: Diagnosis not present

## 2022-04-29 ENCOUNTER — Telehealth: Payer: 59 | Admitting: Family Medicine

## 2022-04-29 ENCOUNTER — Encounter: Payer: Self-pay | Admitting: Family

## 2022-04-29 ENCOUNTER — Other Ambulatory Visit (HOSPITAL_BASED_OUTPATIENT_CLINIC_OR_DEPARTMENT_OTHER): Payer: Self-pay

## 2022-04-29 MED ORDER — PAXLOVID (300/100) 20 X 150 MG & 10 X 100MG PO TBPK
ORAL_TABLET | ORAL | 0 refills | Status: DC
  Filled 2022-04-29: qty 30, 5d supply, fill #0

## 2022-05-12 ENCOUNTER — Encounter: Payer: Self-pay | Admitting: Family

## 2022-05-26 ENCOUNTER — Other Ambulatory Visit: Payer: Self-pay | Admitting: *Deleted

## 2022-05-26 DIAGNOSIS — K5909 Other constipation: Secondary | ICD-10-CM

## 2022-05-26 DIAGNOSIS — D509 Iron deficiency anemia, unspecified: Secondary | ICD-10-CM

## 2022-05-26 NOTE — Progress Notes (Signed)
Referrals placed per pt request.

## 2022-05-31 ENCOUNTER — Telehealth: Payer: Self-pay | Admitting: Hematology

## 2022-05-31 NOTE — Telephone Encounter (Signed)
Scheduled appointment per 11/06 referral . Patient is aware of appointment date and time. Patient is aware to arrive 15 mins prior to appointment time and to bring updated insurance cards. Patient is aware of location.   

## 2022-06-01 DIAGNOSIS — Z3A08 8 weeks gestation of pregnancy: Secondary | ICD-10-CM | POA: Diagnosis not present

## 2022-06-01 DIAGNOSIS — O3680X0 Pregnancy with inconclusive fetal viability, not applicable or unspecified: Secondary | ICD-10-CM | POA: Diagnosis not present

## 2022-06-01 DIAGNOSIS — O210 Mild hyperemesis gravidarum: Secondary | ICD-10-CM | POA: Diagnosis not present

## 2022-06-01 DIAGNOSIS — Z3689 Encounter for other specified antenatal screening: Secondary | ICD-10-CM | POA: Diagnosis not present

## 2022-06-01 DIAGNOSIS — O09521 Supervision of elderly multigravida, first trimester: Secondary | ICD-10-CM | POA: Diagnosis not present

## 2022-06-01 DIAGNOSIS — O99211 Obesity complicating pregnancy, first trimester: Secondary | ICD-10-CM | POA: Diagnosis not present

## 2022-06-02 ENCOUNTER — Other Ambulatory Visit: Payer: Self-pay | Admitting: Family

## 2022-06-02 DIAGNOSIS — F32A Depression, unspecified: Secondary | ICD-10-CM

## 2022-06-04 ENCOUNTER — Other Ambulatory Visit (HOSPITAL_COMMUNITY): Payer: Self-pay

## 2022-06-04 ENCOUNTER — Encounter: Payer: Self-pay | Admitting: Family

## 2022-06-07 ENCOUNTER — Encounter (INDEPENDENT_AMBULATORY_CARE_PROVIDER_SITE_OTHER): Payer: Self-pay

## 2022-06-07 ENCOUNTER — Other Ambulatory Visit (HOSPITAL_COMMUNITY): Payer: Self-pay

## 2022-06-07 MED ORDER — VENLAFAXINE HCL ER 75 MG PO CP24
75.0000 mg | ORAL_CAPSULE | Freq: Every day | ORAL | 0 refills | Status: DC
  Filled 2022-06-07 – 2022-07-03 (×4): qty 90, 90d supply, fill #0

## 2022-06-07 NOTE — Telephone Encounter (Signed)
I have called the pt and she stated that she has spoken to her OBGYN and he stated that effexor is safe to take while pregnant. I have informed her that she may need a follow up before the baby is due in June to discuss the medication. She stated understanding. Please advise if this rx is okay to be refilled.

## 2022-06-08 NOTE — Telephone Encounter (Signed)
I have called pt back and relayed the message from the provider. She stated understanding and has set up the appointment for the 3 m f/u.

## 2022-06-09 ENCOUNTER — Other Ambulatory Visit (HOSPITAL_COMMUNITY): Payer: Self-pay

## 2022-06-14 ENCOUNTER — Inpatient Hospital Stay: Payer: 59 | Attending: Hematology | Admitting: Hematology

## 2022-06-14 NOTE — Progress Notes (Shared)
HEMATOLOGY/ONCOLOGY CONSULTATION NOTE  Date of Service: 06/14/2022  Patient Care Team: Terrilyn Saver, NP as PCP - General (Family Medicine)  CHIEF COMPLAINTS/PURPOSE OF CONSULTATION:  Iron deficiency anemia, unspecified iron deficiency anemia type  HISTORY OF PRESENTING ILLNESS:  Natasha Rice is a wonderful 36 y.o. female who has been referred to [place] by Dr Merryl Hacker for evaluation and management of Iron deficiency anemia, unspecified iron deficiency anemia type.    MEDICAL HISTORY:  Past Medical History:  Diagnosis Date   Allergy    Anemia    Anxiety    Depression    GERD (gastroesophageal reflux disease)    Hypertension    Polycystic ovarian disease    Seasonal allergies    Sleep apnea     SURGICAL HISTORY: Past Surgical History:  Procedure Laterality Date   CESAREAN SECTION     GASTRIC BYPASS     TONSILLECTOMY      SOCIAL HISTORY: Social History   Socioeconomic History   Marital status: Single    Spouse name: Not on file   Number of children: Not on file   Years of education: Not on file   Highest education level: Not on file  Occupational History   Not on file  Tobacco Use   Smoking status: Never   Smokeless tobacco: Never  Vaping Use   Vaping Use: Never used  Substance and Sexual Activity   Alcohol use: Yes    Alcohol/week: 2.0 standard drinks of alcohol    Types: 2 Glasses of wine per week    Comment: Occ   Drug use: No   Sexual activity: Yes    Birth control/protection: Condom  Other Topics Concern   Not on file  Social History Narrative   Not on file   Social Determinants of Health   Financial Resource Strain: Not on file  Food Insecurity: Not on file  Transportation Needs: Not on file  Physical Activity: Not on file  Stress: Not on file  Social Connections: Not on file  Intimate Partner Violence: Not on file    FAMILY HISTORY: Family History  Problem Relation Age of Onset   Stroke Mother    Heart disease Mother    Drug  abuse Mother    Alcohol abuse Mother    Drug abuse Father    Diabetes Father    Hypertension Father    Hyperlipidemia Father    Miscarriages / Stillbirths Sister    Hypertension Sister    Early death Sister    Congestive Heart Failure Son    Intellectual disability Maternal Grandmother    Arthritis Maternal Grandmother    Hypertension Maternal Grandmother    Intellectual disability Maternal Grandfather    Hypertension Maternal Grandfather    COPD Maternal Grandfather    Alcohol abuse Maternal Grandfather    Heart attack Maternal Grandfather    Diabetes Paternal Grandmother    Arthritis Paternal Grandmother    Hypertension Paternal Grandmother    Intellectual disability Paternal Grandmother    Dementia Paternal Grandmother    Alcohol abuse Paternal Grandfather    Intellectual disability Paternal Grandfather     ALLERGIES:  has No Known Allergies.  MEDICATIONS:  Current Outpatient Medications  Medication Sig Dispense Refill   amoxicillin-clavulanate (AUGMENTIN) 875-125 MG tablet Take 1 tablet by mouth 2 (two) times daily. 14 tablet 0   fluconazole (DIFLUCAN) 150 MG tablet Take 1 tablet (150 mg total) by mouth every 3 (three) days as needed. 2 tablet 0  linaclotide (LINZESS) 290 MCG CAPS capsule Take 1 capsule (290 mcg total) by mouth daily. 90 capsule 3   Naltrexone-buPROPion HCl ER (CONTRAVE) 8-90 MG TB12 Take 2 tabs by mouth 2 (two) times daily. 120 tablet 1   triamcinolone cream (KENALOG) 0.1 % Apply 1 Application topically 2 (two) times daily. 30 g 0   valACYclovir (VALTREX) 500 MG tablet Take 1 tablet (500 mg total) by mouth 2 (two) times daily for 3 days as needed 30 tablet 5   venlafaxine XR (EFFEXOR XR) 75 MG 24 hr capsule Take 1 capsule (75 mg total) by mouth daily with breakfast. 90 capsule 0   Vitamin D, Ergocalciferol, (DRISDOL) 1.25 MG (50000 UNIT) CAPS capsule Take 1 capsule (50,000 Units total) by mouth once a week. 12 capsule 11   No current  facility-administered medications for this visit.    REVIEW OF SYSTEMS:    10 Point review of Systems was done is negative except as noted above.  PHYSICAL EXAMINATION: ECOG PERFORMANCE STATUS: {CHL ONC ECOG DJ:[id number]}  .There were no vitals filed for this visit. There were no vitals filed for this visit. .There is no height or weight on file to calculate BMI.  GENERAL:alert, in no acute distress and comfortable SKIN: no acute rashes, no significant lesions EYES: conjunctiva are pink and non-injected, sclera anicteric OROPHARYNX: MMM, no exudates, no oropharyngeal erythema or ulceration NECK: supple, no JVD LYMPH:  no palpable lymphadenopathy in the cervical, axillary or inguinal regions LUNGS: clear to auscultation b/l with normal respiratory effort HEART: regular rate & rhythm ABDOMEN:  normoactive bowel sounds , non tender, not distended. Extremity: no pedal edema PSYCH: alert & oriented x 3 with fluent speech NEURO: no focal motor/sensory deficits  LABORATORY DATA:  I have reviewed the data as listed  .    Latest Ref Rng & Units 04/22/2022    5:23 PM 03/19/2022    1:27 PM 06/17/2020   12:00 AM  CBC  WBC 4.0 - 10.5 K/uL 6.7  5.0  4.6      Hemoglobin 12.0 - 15.0 g/dL 22.2  97.9  89.2      Hematocrit 36.0 - 46.0 % 37.3  40.9  39      Platelets 150 - 400 K/uL 230  226.0  296         This result is from an external source.    .    Latest Ref Rng & Units 04/22/2022    5:23 PM 03/19/2022    1:27 PM 06/17/2020   12:00 AM  CMP  Glucose 70 - 99 mg/dL 94  89    BUN 6 - 20 mg/dL 13  11  15       Creatinine 0.44 - 1.00 mg/dL  1.19  0.7      Sodium 135 - 145 mmol/L 137  135  135      Potassium 3.5 - 5.1 mmol/L 4.9  4.6  4.7      Chloride 98 - 111 mmol/L 105  100  104      CO2 22 - 32 mmol/L 26  29  26       Calcium 8.9 - 10.3 mg/dL 9.3  9.2  9.0      Total Protein 6.0 - 8.3 g/dL  7.3    Total Bilirubin 0.2 - 1.2 mg/dL  0.7    Alkaline Phos 39 - 117 U/L  77  60       AST 0 - 37 U/L  15  15      ALT 0 - 35 U/L  12  10         This result is from an external source.     RADIOGRAPHIC STUDIES: I have personally reviewed the radiological images as listed and agreed with the findings in the report. No results found.  ASSESSMENT & PLAN:  ***  PLAN: ***  FOLLOW-UP: ***  All of the patients questions were answered with apparent satisfaction. The patient knows to call the clinic with any problems, questions or concerns.  I spent {CHL ONC TIME VISIT - [id number] counseling the patient face to face. The total time spent in the appointment was {CHL ONC TIME VISIT - [id number] and more than 50% was on counseling and direct patient cares.    Zettie Cooley, am acting as a scribe for Sullivan Lone, MD.  Sullivan Lone MD [place] AAHIVMS [facility] Hematology/Oncology Physician [facility]  (Office):       [phone] (Work cell):  [phone] (Fax):           [phone]  06/14/2022 11:17 AM

## 2022-06-23 ENCOUNTER — Other Ambulatory Visit (HOSPITAL_COMMUNITY): Payer: Self-pay

## 2022-06-28 DIAGNOSIS — O368390 Maternal care for abnormalities of the fetal heart rate or rhythm, unspecified trimester, not applicable or unspecified: Secondary | ICD-10-CM | POA: Diagnosis not present

## 2022-06-28 DIAGNOSIS — O99211 Obesity complicating pregnancy, first trimester: Secondary | ICD-10-CM | POA: Diagnosis not present

## 2022-06-28 DIAGNOSIS — Z3A13 13 weeks gestation of pregnancy: Secondary | ICD-10-CM | POA: Diagnosis not present

## 2022-06-28 DIAGNOSIS — O09521 Supervision of elderly multigravida, first trimester: Secondary | ICD-10-CM | POA: Diagnosis not present

## 2022-07-02 ENCOUNTER — Other Ambulatory Visit (HOSPITAL_COMMUNITY): Payer: Self-pay

## 2022-07-05 ENCOUNTER — Other Ambulatory Visit (HOSPITAL_COMMUNITY): Payer: Self-pay

## 2022-07-05 ENCOUNTER — Encounter (INDEPENDENT_AMBULATORY_CARE_PROVIDER_SITE_OTHER): Payer: Self-pay | Admitting: Family Medicine

## 2022-07-05 ENCOUNTER — Ambulatory Visit (INDEPENDENT_AMBULATORY_CARE_PROVIDER_SITE_OTHER): Payer: 59 | Admitting: Family Medicine

## 2022-07-05 VITALS — BP 121/81 | HR 84 | Temp 97.9°F | Ht 61.0 in | Wt 224.0 lb

## 2022-07-05 DIAGNOSIS — E669 Obesity, unspecified: Secondary | ICD-10-CM | POA: Diagnosis not present

## 2022-07-05 DIAGNOSIS — Z9884 Bariatric surgery status: Secondary | ICD-10-CM

## 2022-07-05 DIAGNOSIS — Z3A13 13 weeks gestation of pregnancy: Secondary | ICD-10-CM | POA: Insufficient documentation

## 2022-07-05 DIAGNOSIS — Z6841 Body Mass Index (BMI) 40.0 and over, adult: Secondary | ICD-10-CM

## 2022-07-06 ENCOUNTER — Other Ambulatory Visit (HOSPITAL_COMMUNITY): Payer: Self-pay

## 2022-07-12 ENCOUNTER — Telehealth: Payer: 59 | Admitting: Nurse Practitioner

## 2022-07-12 DIAGNOSIS — J069 Acute upper respiratory infection, unspecified: Secondary | ICD-10-CM

## 2022-07-12 NOTE — Progress Notes (Signed)
E-Visit for Sore Throat  We are sorry that you are not feeling well.  Here is how we plan to help!  Your symptoms indicate a likely viral infection (Pharyngitis).   Pharyngitis is inflammation in the back of the throat which can cause a sore throat, scratchiness and sometimes difficulty swallowing.   Pharyngitis is typically caused by a respiratory virus and will just run its course.    Speak with your OBGYN about safe cough and cold medications you can use while pregnant.  We would recommend saline nasal sprays, warm salt water gargles and sore throat spray or lozenges.  When you have a runny nose and sore throat, the sore throat is usually due to the post nasal drainage.     Please keep in mind that your symptoms could last up to 10 days.  For throat pain, we recommend over the counter oral pain relief medications such as acetaminophen or aspirin, or anti-inflammatory medications such as ibuprofen or naproxen sodium.  Topical treatments such as oral throat lozenges or sprays may be used as needed.  Avoid close contact with loved ones, especially the very young and elderly.  Remember to wash your hands thoroughly throughout the day as this is the number one way to prevent the spread of infection and wipe down door knobs and counters with disinfectant.  After careful review of your answers, I would not recommend an antibiotic for your condition.  Antibiotics should not be used to treat conditions that we suspect are caused by viruses like the virus that causes the common cold or flu. However, some people can have Strep with atypical symptoms. You may need formal testing in clinic or office to confirm if your symptoms continue or worsen.  Providers prescribe antibiotics to treat infections caused by bacteria. Antibiotics are very powerful in treating bacterial infections when they are used properly.  To maintain their effectiveness, they should be used only when necessary.  Overuse of antibiotics has  resulted in the development of super bugs that are resistant to treatment!    Home Care: Only take medications as instructed by your medical team. Do not drink alcohol while taking these medications. A steam or ultrasonic humidifier can help congestion.  You can place a towel over your head and breathe in the steam from hot water coming from a faucet. Avoid close contacts especially the very young and the elderly. Cover your mouth when you cough or sneeze. Always remember to wash your hands.  Get Help Right Away If: You develop worsening fever or throat pain. You develop a severe head ache or visual changes. Your symptoms persist after you have completed your treatment plan.  Make sure you Understand these instructions. Will watch your condition. Will get help right away if you are not doing well or get worse.   Thank you for choosing an e-visit.  Your e-visit answers were reviewed by a board certified advanced clinical practitioner to complete your personal care plan. Depending upon the condition, your plan could have included both over the counter or prescription medications.  Please review your pharmacy choice. Make sure the pharmacy is open so you can pick up prescription now. If there is a problem, you may contact your provider through [employer] and have the prescription routed to another pharmacy.  Your safety is important to [place]. If you have drug allergies check your prescription carefully.   For the next 24 hours you can use MyChart to ask questions about today's visit, request a  non-urgent call back, or ask for a work or school excuse. You will get an email in the next two days asking about your experience. I hope that your e-visit has been valuable and will speed your recovery.   I spent approximately 5 minutes reviewing the patient's history, current symptoms and coordinating their care today.

## 2022-07-12 NOTE — Progress Notes (Signed)
Office: [phone]  /  Fax: [phone]   Initial Visit  Natasha Rice was seen in clinic today to evaluate for obesity. She is interested in losing weight to improve overall health and reduce the risk of weight related complications. She presents today to review program treatment options, initial physical assessment, and evaluation.     She was referred by: PCP  When asked what else they would like to accomplish? She states: Adopt healthier eating patterns, Improve quality of life, and Lose a target amount of weight : Target weight between 160-170 LBS .  When asked how has your weight affected you? She states: Contributed to orthopedic problems or mobility issues and Having fatigue  Some associated conditions: Other: Iron deficiency anemia, pregnancy  Contributing factors: Life event, multiple deaths in the family.  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  Weight promoting medications identified: None  Current nutrition plan: None  Current level of physical activity: Walking a lot at work.  Current or previous pharmacotherapy: Phentermine, Topiramate, and Other: Contrave .  Response to medication: Lost weight initially but was unable to sustain weight loss and Other: She is seeing Dr. Allena Katz at [facility], no registered dietitian there .  Past medical history includes:   Past Medical History:  Diagnosis Date   Allergy    Anemia    Anxiety    Depression    GERD (gastroesophageal reflux disease)    Hypertension    Polycystic ovarian disease    Seasonal allergies    Sleep apnea    Objective:   BP 121/81   Pulse 84   Temp 97.9 F (36.6 C)   Ht 5\' 1"  (1.549 m)   Wt 224 lb (101.6 kg)   LMP 03/31/2022   SpO2 100%   BMI 42.32 kg/m  She was weighed on the bioimpedance scale: Body mass index is 42.32 kg/m.Visceral Fat Rating:14, Body Fat%:49.1  General:  Alert, oriented and cooperative. Patient is in no acute distress.  Respiratory: Normal  respiratory effort, no problems with respiration noted  Extremities: Normal range of motion.    Mental Status: Normal mood and affect. Normal behavior. Normal judgment and thought content.   Assessment and Plan:  1. [redacted] weeks gestation of pregnancy Patient patient is currently 13 weeks 6 days pregnant.  She is able to consume all foods without nausea or vomiting.  She is taking her prenatal vitamin daily.  Patient will plan to adjust recommended calories to maintain weight during pregnancy.  2. S/P laparoscopic sleeve gastrectomy Procedure done January 2017 with Dr. February 2017.  Preop to 220 LBS, Nadir 160 LBS.  Less restriction with fluid volume.  She has needed IV iron infusion and prescription vitamin D weekly.  Patient will be working on 5 small meals per day including protein, fiber and with water intake between meals.  3. Obesity,current BMI 42.3 1.  Continue prenatal vitamin daily, prescription vitamin D 50,000 IU weekly. 2.  Discussed our program. 3.  Begin tracking daily steps with a smart watch.  We reviewed weight, biometrics, associated medical conditions and contributing factors with patient. She would benefit from weight loss therapy via a modified calorie, low-carb, high-protein nutritional plan tailored to their REE (resting energy expenditure) which will be determined by indirect calorimetry.  We will also assess for cardiometabolic risk and nutritional derangements via fasting serologies at her next appointment.     Obesity Treatment / Action Plan:  Patient will work on garnering support from family  and friends to begin weight loss journey. Will work on eliminating or reducing the presence of highly palatable, calorie dense foods in the home. Will complete provided nutritional and psychosocial assessment questionnaire before the next appointment. Will be scheduled for indirect calorimetry to determine resting energy expenditure in a fasting state.  This will allow [place] to create  a reduced calorie, high-protein meal plan to promote loss of fat mass while preserving muscle mass. Was counseled on nutritional approaches to weight loss and benefits of complex carbs and high quality protein as part of nutritional weight management.  Obesity Education Performed Today:  She was weighed on the bioimpedance scale and results were discussed and documented in the synopsis.  We discussed obesity as a disease and the importance of a more detailed evaluation of all the factors contributing to the disease.  We discussed the importance of long term lifestyle changes which include nutrition, exercise and behavioral modifications as well as the importance of customizing this to her specific health and social needs.  We discussed the benefits of reaching a healthier weight to alleviate the symptoms of existing conditions and reduce the risks of the biomechanical, metabolic and psychological effects of obesity.  Natasha Rice appears to be in the action stage of change and states they are ready to start intensive lifestyle modifications and behavioral modifications.  30 minutes was spent today on this visit including the above counseling, pre-visit chart review, and post-visit documentation.  Reviewed by clinician on day of visit: allergies, medications, problem list, medical history, surgical history, family history, social history, and previous encounter notes.  I, Malcolm Metro, am acting as Energy manager for Natasha Bars, DO.  I have reviewed the above documentation for accuracy and completeness, and I agree with the above. Natasha Brink, DO

## 2022-07-13 ENCOUNTER — Other Ambulatory Visit (HOSPITAL_COMMUNITY): Payer: Self-pay

## 2022-07-13 ENCOUNTER — Telehealth: Payer: 59 | Admitting: Physician Assistant

## 2022-07-13 ENCOUNTER — Ambulatory Visit (INDEPENDENT_AMBULATORY_CARE_PROVIDER_SITE_OTHER): Payer: 59 | Admitting: Family Medicine

## 2022-07-13 ENCOUNTER — Encounter: Payer: Self-pay | Admitting: Family Medicine

## 2022-07-13 VITALS — BP 124/70 | HR 78 | Temp 97.3°F | Ht 61.0 in | Wt 228.8 lb

## 2022-07-13 DIAGNOSIS — Z3A14 14 weeks gestation of pregnancy: Secondary | ICD-10-CM | POA: Diagnosis not present

## 2022-07-13 DIAGNOSIS — J069 Acute upper respiratory infection, unspecified: Secondary | ICD-10-CM | POA: Diagnosis not present

## 2022-07-13 LAB — POC COVID19 BINAXNOW: SARS Coronavirus 2 Ag: NEGATIVE

## 2022-07-13 LAB — POCT INFLUENZA A/B
Influenza A, POC: NEGATIVE
Influenza B, POC: NEGATIVE

## 2022-07-13 MED ORDER — FLUTICASONE PROPIONATE 50 MCG/ACT NA SUSP
2.0000 | Freq: Every day | NASAL | 0 refills | Status: DC
  Filled 2022-07-13: qty 16, 30d supply, fill #0

## 2022-07-13 NOTE — Progress Notes (Signed)

## 2022-07-13 NOTE — Progress Notes (Signed)
[facility] [address] [place] [address] Dept: [phone] Dept Fax: [phone]  Office Visit  Subjective:    Patient ID: Natasha Rice, female    DOB: 29-Jul-1985, 36 y.o..   MRN: [id number]  Chief Complaint  Patient presents with   Acute Visit    History of Present Illness:  Patient is in today with a 4-day history of nasal congestion with runny nose, chest congestion, and cough. She has not been running fever. She is using Mucinex and Flonase to manage her symptoms. MS. [place] did have COVID in Oct. She is currently 14 6/[redacted] weeks pregnant.  Past Medical History: Patient Active Problem List   Diagnosis Date Noted   [redacted] weeks gestation of pregnancy 07/05/2022   S/P laparoscopic sleeve gastrectomy 07/05/2022   Anemia 02/15/2022   Anxiety and depression 02/15/2022   Morbid obesity (HCC) 02/15/2022   Vitamin D deficiency 02/15/2022   Past Surgical History:  Procedure Laterality Date   CESAREAN SECTION     GASTRIC BYPASS     TONSILLECTOMY     Family History  Problem Relation Age of Onset   Stroke Mother    Heart disease Mother    Drug abuse Mother    Alcohol abuse Mother    Drug abuse Father    Diabetes Father    Hypertension Father    Hyperlipidemia Father    Miscarriages / Stillbirths Sister    Hypertension Sister    Early death Sister    Congestive Heart Failure Son    Intellectual disability Maternal Grandmother    Arthritis Maternal Grandmother    Hypertension Maternal Grandmother    Intellectual disability Maternal Grandfather    Hypertension Maternal Grandfather    COPD Maternal Grandfather    Alcohol abuse Maternal Grandfather    Heart attack Maternal Grandfather    Diabetes Paternal Grandmother    Arthritis Paternal Grandmother    Hypertension Paternal Grandmother    Intellectual disability Paternal Grandmother    Dementia Paternal Grandmother    Alcohol abuse Paternal Grandfather     Intellectual disability Paternal Grandfather    Outpatient Medications Prior to Visit  Medication Sig Dispense Refill   fluticasone (FLONASE) 50 MCG/ACT nasal spray Place 2 sprays into both nostrils daily. 16 g 0   loratadine (CLARITIN) 10 MG tablet Take by mouth.     valACYclovir (VALTREX) 500 MG tablet Take 1 tablet (500 mg total) by mouth 2 (two) times daily for 3 days as needed 30 tablet 5   venlafaxine XR (EFFEXOR XR) 75 MG 24 hr capsule Take 1 capsule (75 mg total) by mouth daily with breakfast. 90 capsule 0   Vitamin D, Ergocalciferol, (DRISDOL) 1.25 MG (50000 UNIT) CAPS capsule Take 1 capsule (50,000 Units total) by mouth once a week. 12 capsule 11   linaclotide (LINZESS) 290 MCG CAPS capsule Take 1 capsule (290 mcg total) by mouth daily. (Patient not taking: Reported on 07/05/2022) 90 capsule 3   triamcinolone cream (KENALOG) 0.1 % Apply 1 Application topically 2 (two) times daily. 30 g 0   No facility-administered medications prior to visit.   No Known Allergies    Objective:   Today's Vitals   07/13/22 1514  BP: 124/70  Pulse: 78  Temp: (!) 97.3 F (36.3 C)  TempSrc: Temporal  SpO2: 99%  Weight: 228 lb 12.8 oz (103.8 kg)  Height: 5\' 1"  (1.549 m)   Body mass index is 43.23 kg/m.   General: Well developed, well  nourished. No acute distress. HEENT: Normocephalic, non-traumatic. Conjunctiva clear. External ears normal. EAC and TMs normal   bilaterally. Nose with moderate congestion and rhinorrhea. Mucous membranes moist. Oropharynx clear.   Good dentition. Neck: Supple. No lymphadenopathy. No thyromegaly. Lungs: Clear to auscultation bilaterally. No wheezing, rales or rhonchi. CV: RRR without murmurs or rubs. Pulses 2+ bilaterally. Psych: Alert and oriented. Normal mood and affect.  Health Maintenance Due  Topic Date Due   Hepatitis C Screening  Never done   PAP SMEAR-Modifier  07/26/2022   Lab Results: POCT Covid: Neg. POCT Influenza A& B: Neg.    Assessment  & Plan:   1. Viral URI with cough Discussed home care for viral illness, including rest, pushing fluids, and OTC medications as needed for symptom relief. We reviewed OTCs which are safe for use in pregnancy. Recommend hot tea with honey for sore throat symptoms. Follow-up if needed for worsening or persistent symptoms.  - POCT Influenza A/B - POC COVID-19  2. Pregnancy with 14 completed weeks gestation Continue to follow with Dr. Allena Katz.   Return if symptoms worsen or fail to improve.   Loyola Mast, MD

## 2022-07-16 ENCOUNTER — Inpatient Hospital Stay: Payer: 59

## 2022-07-16 ENCOUNTER — Inpatient Hospital Stay: Payer: 59 | Attending: Hematology | Admitting: Hematology and Oncology

## 2022-07-16 VITALS — BP 136/90 | HR 81 | Temp 98.0°F | Resp 17 | Wt 229.5 lb

## 2022-07-16 DIAGNOSIS — D509 Iron deficiency anemia, unspecified: Secondary | ICD-10-CM | POA: Insufficient documentation

## 2022-07-16 DIAGNOSIS — Z8261 Family history of arthritis: Secondary | ICD-10-CM | POA: Insufficient documentation

## 2022-07-16 DIAGNOSIS — I10 Essential (primary) hypertension: Secondary | ICD-10-CM | POA: Insufficient documentation

## 2022-07-16 DIAGNOSIS — Z825 Family history of asthma and other chronic lower respiratory diseases: Secondary | ICD-10-CM | POA: Insufficient documentation

## 2022-07-16 DIAGNOSIS — Z808 Family history of malignant neoplasm of other organs or systems: Secondary | ICD-10-CM | POA: Diagnosis not present

## 2022-07-16 DIAGNOSIS — F419 Anxiety disorder, unspecified: Secondary | ICD-10-CM | POA: Insufficient documentation

## 2022-07-16 DIAGNOSIS — E282 Polycystic ovarian syndrome: Secondary | ICD-10-CM | POA: Diagnosis not present

## 2022-07-16 DIAGNOSIS — Z79899 Other long term (current) drug therapy: Secondary | ICD-10-CM | POA: Diagnosis not present

## 2022-07-16 DIAGNOSIS — Z811 Family history of alcohol abuse and dependence: Secondary | ICD-10-CM | POA: Diagnosis not present

## 2022-07-16 DIAGNOSIS — D500 Iron deficiency anemia secondary to blood loss (chronic): Secondary | ICD-10-CM

## 2022-07-16 DIAGNOSIS — R200 Anesthesia of skin: Secondary | ICD-10-CM | POA: Insufficient documentation

## 2022-07-16 DIAGNOSIS — Z833 Family history of diabetes mellitus: Secondary | ICD-10-CM | POA: Insufficient documentation

## 2022-07-16 DIAGNOSIS — R5383 Other fatigue: Secondary | ICD-10-CM | POA: Insufficient documentation

## 2022-07-16 DIAGNOSIS — Z832 Family history of diseases of the blood and blood-forming organs and certain disorders involving the immune mechanism: Secondary | ICD-10-CM | POA: Diagnosis not present

## 2022-07-16 DIAGNOSIS — Z8349 Family history of other endocrine, nutritional and metabolic diseases: Secondary | ICD-10-CM | POA: Diagnosis not present

## 2022-07-16 DIAGNOSIS — F32A Depression, unspecified: Secondary | ICD-10-CM | POA: Diagnosis not present

## 2022-07-16 DIAGNOSIS — Z814 Family history of other substance abuse and dependence: Secondary | ICD-10-CM | POA: Diagnosis not present

## 2022-07-16 DIAGNOSIS — R059 Cough, unspecified: Secondary | ICD-10-CM | POA: Insufficient documentation

## 2022-07-16 DIAGNOSIS — I4891 Unspecified atrial fibrillation: Secondary | ICD-10-CM | POA: Diagnosis not present

## 2022-07-16 DIAGNOSIS — Z9884 Bariatric surgery status: Secondary | ICD-10-CM | POA: Insufficient documentation

## 2022-07-16 DIAGNOSIS — K219 Gastro-esophageal reflux disease without esophagitis: Secondary | ICD-10-CM | POA: Diagnosis not present

## 2022-07-16 DIAGNOSIS — R42 Dizziness and giddiness: Secondary | ICD-10-CM | POA: Diagnosis not present

## 2022-07-16 DIAGNOSIS — Z8249 Family history of ischemic heart disease and other diseases of the circulatory system: Secondary | ICD-10-CM | POA: Insufficient documentation

## 2022-07-16 DIAGNOSIS — Z823 Family history of stroke: Secondary | ICD-10-CM | POA: Insufficient documentation

## 2022-07-16 DIAGNOSIS — Z818 Family history of other mental and behavioral disorders: Secondary | ICD-10-CM | POA: Insufficient documentation

## 2022-07-16 LAB — CMP (CANCER CENTER ONLY)
ALT: 12 U/L (ref 0–44)
AST: 13 U/L — ABNORMAL LOW (ref 15–41)
Albumin: 3.5 g/dL (ref 3.5–5.0)
Alkaline Phosphatase: 66 U/L (ref 38–126)
Anion gap: 5 (ref 5–15)
BUN: 8 mg/dL (ref 6–20)
CO2: 27 mmol/L (ref 22–32)
Calcium: 9 mg/dL (ref 8.9–10.3)
Chloride: 104 mmol/L (ref 98–111)
Creatinine: 0.62 mg/dL (ref 0.44–1.00)
GFR, Estimated: >60 mL/min (ref >60)
Glucose, Bld: 101 mg/dL — ABNORMAL HIGH (ref 70–99)
Potassium: 3.9 mmol/L (ref 3.5–5.1)
Sodium: 136 mmol/L (ref 135–145)
Total Bilirubin: 0.3 mg/dL (ref 0.3–1.2)
Total Protein: 6.7 g/dL (ref 6.5–8.1)

## 2022-07-16 LAB — RETIC PANEL
Immature Retic Fract: 14.3 % (ref 2.3–15.9)
RBC.: 4.79 MIL/uL (ref 3.87–5.11)
Retic Count, Absolute: 62.3 10*3/uL (ref 19.0–186.0)
Retic Ct Pct: 1.3 % (ref 0.4–3.1)
Reticulocyte Hemoglobin: 23.9 pg — ABNORMAL LOW (ref >27.9)

## 2022-07-16 LAB — CBC WITH DIFFERENTIAL (CANCER CENTER ONLY)
Abs Immature Granulocytes: 0.02 10*3/uL (ref 0.00–0.07)
Basophils Absolute: 0 10*3/uL (ref 0.0–0.1)
Basophils Relative: 0 %
Eosinophils Absolute: 0.1 10*3/uL (ref 0.0–0.5)
Eosinophils Relative: 1 %
HCT: 34.3 % — ABNORMAL LOW (ref 36.0–46.0)
Hemoglobin: 11.1 g/dL — ABNORMAL LOW (ref 12.0–15.0)
Immature Granulocytes: 0 %
Lymphocytes Relative: 19 %
Lymphs Abs: 1.6 10*3/uL (ref 0.7–4.0)
MCH: 23.5 pg — ABNORMAL LOW (ref 26.0–34.0)
MCHC: 32.4 g/dL (ref 30.0–36.0)
MCV: 72.7 fL — ABNORMAL LOW (ref 80.0–100.0)
Monocytes Absolute: 0.7 10*3/uL (ref 0.1–1.0)
Monocytes Relative: 8 %
Neutro Abs: 6.1 10*3/uL (ref 1.7–7.7)
Neutrophils Relative %: 72 %
Platelet Count: 243 10*3/uL (ref 150–400)
RBC: 4.72 MIL/uL (ref 3.87–5.11)
RDW: 13.8 % (ref 11.5–15.5)
WBC Count: 8.6 10*3/uL (ref 4.0–10.5)
nRBC: 0 % (ref 0.0–0.2)

## 2022-07-16 LAB — IRON AND IRON BINDING CAPACITY (CC-WL,HP ONLY)
Iron: 28 ug/dL (ref 28–170)
Saturation Ratios: 8 % — ABNORMAL LOW (ref 10.4–31.8)
TIBC: 356 ug/dL (ref 250–450)
UIBC: 328 ug/dL (ref 148–442)

## 2022-07-16 LAB — FERRITIN: Ferritin: 35 ng/mL (ref 11–307)

## 2022-07-19 NOTE — Progress Notes (Signed)
[place] [phone]   Fax:([phone] [place] NOTE  Patient Care Team: Terrilyn Saver, NP as PCP - General (Family Medicine) Frazier Butt., MD (Obstetrics and Gynecology)  Hematological/Oncological History # Iron Deficiency Anemia in Setting of Pregnancy   CHIEF COMPLAINTS/PURPOSE OF CONSULTATION:  "Iron Deficiency Anemia "  HISTORY OF PRESENTING ILLNESS:  [name] 36 y.o. female with medical history significant for anxiety, depression, GERD, hypertension, polycystic ovarian disease, and allergies who presents for evaluation of iron deficiency anemia in pregnancy.  On review of the previous records Ms. Duhe had labs collected on 04/22/2022 which showed a white blood cell count 6.7, hemoglobin 11.7, MCV 79.9, and platelets of 230.  The patient requested evaluation by hematology given prior episodes of iron deficiency anemia during pregnancy.  On exam today Ms. Radcliff reports that she has received iron infusions before in the past.  She reports that after her second child began developing heavy cycles and was having numbness in her hands and feet.  She reports that she began experiencing the symptoms again and wanted to be evaluated for iron deficiency prior to delivery.  She reports that the baby is currently due in June 2024.  She notes that she has received Venofer before in the past.  She does that she does enjoy eating red meat as well as chicken livers.  She reports that she cannot take p.o. iron therapy as it causes constipation.  She tried it with orange juice but it was just never effective for her.  She reports that her menstrual cycles lasted for approximately 5 days with the heaviest days being 1 through 3.  She would go throughout 3-4 pads soaked in that time.  On further discussion she reports that her cousin died of a blood cancer and blood clot.  She reports that her mother has high cholesterol and had a stroke.  Her father  has schizophrenia.  She reports that her sister died of an aortic dissection.  She is a never smoker and drinks 1 glass of wine "here and there".  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  She is having trouble with fatigue, cough, lightheadedness, and tiredness.  She reports the numbness sensation and occasional feeling of being cold.  She is not having bleeding from any other sources.  A full 10 point ROS was otherwise negative.  MEDICAL HISTORY:  Past Medical History:  Diagnosis Date   Allergy    Anemia    Anxiety    Depression    GERD (gastroesophageal reflux disease)    Hypertension    Polycystic ovarian disease    Seasonal allergies    Sleep apnea     SURGICAL HISTORY: Past Surgical History:  Procedure Laterality Date   CESAREAN SECTION     GASTRIC BYPASS     TONSILLECTOMY      SOCIAL HISTORY: Social History   Socioeconomic History   Marital status: Single    Spouse name: Not on file   Number of children: Not on file   Years of education: Not on file   Highest education level: Not on file  Occupational History   Not on file  Tobacco Use   Smoking status: Never   Smokeless tobacco: Never  Vaping Use   Vaping Use: Never used  Substance and Sexual Activity   Alcohol use: Not Currently    Alcohol/week: 2.0 standard drinks of alcohol    Types: 2 Glasses of  wine per week    Comment: Occ   Drug use: No   Sexual activity: Yes    Birth control/protection: Condom  Other Topics Concern   Not on file  Social History Narrative   Not on file   Social Determinants of Health   Financial Resource Strain: Not on file  Food Insecurity: Not on file  Transportation Needs: Not on file  Physical Activity: Not on file  Stress: Not on file  Social Connections: Not on file  Intimate Partner Violence: Not on file    FAMILY HISTORY: Family History  Problem Relation Age of Onset   Stroke Mother    Heart disease Mother    Drug abuse Mother     Alcohol abuse Mother    Drug abuse Father    Diabetes Father    Hypertension Father    Hyperlipidemia Father    Miscarriages / Stillbirths Sister    Hypertension Sister    Early death Sister    Congestive Heart Failure Son    Intellectual disability Maternal Grandmother    Arthritis Maternal Grandmother    Hypertension Maternal Grandmother    Intellectual disability Maternal Grandfather    Hypertension Maternal Grandfather    COPD Maternal Grandfather    Alcohol abuse Maternal Grandfather    Heart attack Maternal Grandfather    Diabetes Paternal Grandmother    Arthritis Paternal Grandmother    Hypertension Paternal Grandmother    Intellectual disability Paternal Grandmother    Dementia Paternal Grandmother    Alcohol abuse Paternal Grandfather    Intellectual disability Paternal Grandfather     ALLERGIES:  has No Known Allergies.  MEDICATIONS:  Current Outpatient Medications  Medication Sig Dispense Refill   benzonatate (TESSALON) 100 MG capsule Take 1 capsule by mouth 3 (three) times daily as needed.     Prenatal Vit-Fe Fumarate-FA (MULTIVITAMIN-PRENATAL) 27-0.8 MG TABS tablet Take 1 tablet by mouth daily at 12 noon.     fluticasone (FLONASE) 50 MCG/ACT nasal spray Place 2 sprays into both nostrils daily. 16 g 0   linaclotide (LINZESS) 290 MCG CAPS capsule Take 1 capsule (290 mcg total) by mouth daily. (Patient not taking: Reported on 07/05/2022) 90 capsule 3   loratadine (CLARITIN) 10 MG tablet Take by mouth.     valACYclovir (VALTREX) 500 MG tablet Take 1 tablet (500 mg total) by mouth 2 (two) times daily for 3 days as needed 30 tablet 5   venlafaxine XR (EFFEXOR XR) 75 MG 24 hr capsule Take 1 capsule (75 mg total) by mouth daily with breakfast. 90 capsule 0   Vitamin D, Ergocalciferol, (DRISDOL) 1.25 MG (50000 UNIT) CAPS capsule Take 1 capsule (50,000 Units total) by mouth once a week. 12 capsule 11   No current facility-administered medications for this visit.     REVIEW OF SYSTEMS:   Constitutional: ( - ) fevers, ( - )  chills , ( - ) night sweats Eyes: ( - ) blurriness of vision, ( - ) double vision, ( - ) watery eyes Ears, nose, mouth, throat, and face: ( - ) mucositis, ( - ) sore throat Respiratory: ( - ) cough, ( - ) dyspnea, ( - ) wheezes Cardiovascular: ( - ) palpitation, ( - ) chest discomfort, ( - ) lower extremity swelling Gastrointestinal:  ( - ) nausea, ( - ) heartburn, ( - ) change in bowel habits Skin: ( - ) abnormal skin rashes Lymphatics: ( - ) new lymphadenopathy, ( - ) easy bruising Neurological: ( - )  numbness, ( - ) tingling, ( - ) new weaknesses Behavioral/Psych: ( - ) mood change, ( - ) new changes  All other systems were reviewed with the patient and are negative.  PHYSICAL EXAMINATION:  Vitals:   07/16/22 1344  BP: (!) 136/90  Pulse: 81  Resp: 17  Temp: 98 F (36.7 C)  SpO2: 100%   Filed Weights   07/16/22 1344  Weight: 229 lb 8 oz (104.1 kg)    GENERAL: well appearing middle-aged African-American female in NAD  SKIN: skin color, texture, turgor are normal, no rashes or significant lesions EYES: conjunctiva are pink and non-injected, sclera clear LUNGS: clear to auscultation and percussion with normal breathing effort HEART: regular rate & rhythm and no murmurs and no lower extremity edema Musculoskeletal: no cyanosis of digits and no clubbing  PSYCH: alert & oriented x 3, fluent speech NEURO: no focal motor/sensory deficits  LABORATORY DATA:  I have reviewed the data as listed    Latest Ref Rng & Units 07/16/2022    2:40 PM 04/22/2022    5:23 PM 03/19/2022    1:27 PM  CBC  WBC 4.0 - 10.5 K/uL 8.6  6.7  5.0   Hemoglobin 12.0 - 15.0 g/dL 11.1  11.7  12.8   Hematocrit 36.0 - 46.0 % 34.3  37.3  40.9   Platelets 150 - 400 K/uL 243  230  226.0        Latest Ref Rng & Units 07/16/2022    2:40 PM 04/22/2022    5:23 PM 03/19/2022    1:27 PM  CMP  Glucose 70 - 99 mg/dL 101  94  89   BUN 6 - 20 mg/dL 8   13  11    Creatinine 0.44 - 1.00 mg/dL 0.62  0.81  0.99   Sodium 135 - 145 mmol/L 136  137  135   Potassium 3.5 - 5.1 mmol/L 3.9  4.9  4.6   Chloride 98 - 111 mmol/L 104  105  100   CO2 22 - 32 mmol/L 27  26  29    Calcium 8.9 - 10.3 mg/dL 9.0  9.3  9.2   Total Protein 6.5 - 8.1 g/dL 6.7   7.3   Total Bilirubin 0.3 - 1.2 mg/dL 0.3   0.7   Alkaline Phos 38 - 126 U/L 66   77   AST 15 - 41 U/L 13   15   ALT 0 - 44 U/L 12   12      ASSESSMENT & PLAN [name] 36 y.o. female with medical history significant for anxiety, depression, GERD, hypertension, polycystic ovarian disease, and allergies who presents for evaluation of iron deficiency anemia in pregnancy.  After review of the labs, review of the records, and discussion with the patient the patients findings are most consistent with iron deficiency secondary to GYN bleeding, currently worsened by pregnancy.  # Iron Deficiency Anemia 2/2 to GYN Bleeding # Iron Deficiency In Pregnancy  -- Findings are consistent with iron deficiency anemia secondary to patient's menorrhagia --Encouraged her to follow-up with OB/GYN for better control of her menstrual cycles following her delivery. --We will confirm iron deficiency anemia by ordering iron panel and ferritin as well as reticulocytes, CBC, and CMP -- Patient unable to tolerate ferrous sulfate --Patient currently being pregnant and due in June 2024. --We will plan to proceed with IV iron therapy in order to help bolster the patient's blood counts.  Patient will require infusions at day hospital  for fetal monitoring. --Plan for return to clinic in 4 to 6 weeks time after last dose of IV iron   Orders Placed This Encounter  Procedures   CBC with Differential (Cancer Center Only)    Standing Status:   Future    Number of Occurrences:   1    Standing Expiration Date:   07/17/2023   CMP (Cancer Center only)    Standing Status:   Future    Number of Occurrences:   1    Standing  Expiration Date:   07/17/2023   Ferritin    Standing Status:   Future    Number of Occurrences:   1    Standing Expiration Date:   07/17/2023   Iron and Iron Binding Capacity ([facility] only)    Standing Status:   Future    Number of Occurrences:   1    Standing Expiration Date:   07/17/2023   Retic Panel    Standing Status:   Future    Number of Occurrences:   1    Standing Expiration Date:   07/17/2023    All questions were answered. The patient knows to call the clinic with any problems, questions or concerns.  A total of more than 60 minutes were spent on this encounter with face-to-face time and non-face-to-face time, including preparing to see the patient, ordering tests and/or medications, counseling the patient and coordination of care as outlined above.   Ulysees Barns, MD Department of Hematology/Oncology [facility] Phone: [phone] Pager: [phone] Email: [email or web address]  07/19/2022 6:17 PM

## 2022-07-20 ENCOUNTER — Other Ambulatory Visit: Payer: Self-pay | Admitting: *Deleted

## 2022-07-20 NOTE — Progress Notes (Signed)
Patient called to schedule her iron infusion at [facility].  Scheduled and attempted to notify patient.  Left message.    Orders are on file.

## 2022-07-23 ENCOUNTER — Other Ambulatory Visit (HOSPITAL_COMMUNITY): Payer: Self-pay

## 2022-07-30 ENCOUNTER — Other Ambulatory Visit (HOSPITAL_COMMUNITY): Payer: Self-pay

## 2022-08-02 ENCOUNTER — Ambulatory Visit (HOSPITAL_COMMUNITY)
Admission: RE | Admit: 2022-08-02 | Discharge: 2022-08-02 | Disposition: A | Discharge status: Home / Self Care | Payer: 59 | Source: Ambulatory Visit | Attending: Hematology and Oncology | Admitting: Hematology and Oncology

## 2022-08-02 DIAGNOSIS — D500 Iron deficiency anemia secondary to blood loss (chronic): Secondary | ICD-10-CM | POA: Diagnosis not present

## 2022-08-02 MED ORDER — SODIUM CHLORIDE 0.9 % IV SOLN
510.0000 mg | INTRAVENOUS | Status: DC
  Administered 2022-08-02: 510 mg via INTRAVENOUS
  Filled 2022-08-02: qty 510

## 2022-08-09 ENCOUNTER — Ambulatory Visit (HOSPITAL_COMMUNITY)
Admission: RE | Admit: 2022-08-09 | Discharge: 2022-08-09 | Disposition: A | Discharge status: Home / Self Care | Payer: 59 | Source: Ambulatory Visit | Attending: Hematology and Oncology | Admitting: Hematology and Oncology

## 2022-08-09 DIAGNOSIS — D500 Iron deficiency anemia secondary to blood loss (chronic): Secondary | ICD-10-CM

## 2022-08-09 DIAGNOSIS — Z0289 Encounter for other administrative examinations: Secondary | ICD-10-CM

## 2022-08-09 MED ORDER — SODIUM CHLORIDE 0.9 % IV SOLN
510.0000 mg | INTRAVENOUS | Status: DC
  Administered 2022-08-09: 510 mg via INTRAVENOUS
  Filled 2022-08-09: qty 17

## 2022-08-13 ENCOUNTER — Other Ambulatory Visit (HOSPITAL_COMMUNITY): Payer: Self-pay

## 2022-08-20 ENCOUNTER — Other Ambulatory Visit (HOSPITAL_COMMUNITY): Payer: Self-pay

## 2022-08-20 MED ORDER — NIFEDIPINE ER OSMOTIC RELEASE 30 MG PO TB24
30.0000 mg | ORAL_TABLET | Freq: Every day | ORAL | 2 refills | Status: DC
  Filled 2022-08-20: qty 60, 60d supply, fill #0
  Filled 2022-10-08: qty 60, 60d supply, fill #1
  Filled 2022-11-28: qty 60, 60d supply, fill #2

## 2022-09-06 ENCOUNTER — Encounter: Payer: Self-pay | Admitting: Family Medicine

## 2022-09-06 ENCOUNTER — Other Ambulatory Visit (HOSPITAL_BASED_OUTPATIENT_CLINIC_OR_DEPARTMENT_OTHER): Payer: Self-pay

## 2022-09-06 ENCOUNTER — Ambulatory Visit (INDEPENDENT_AMBULATORY_CARE_PROVIDER_SITE_OTHER): Payer: 59 | Admitting: Family Medicine

## 2022-09-06 VITALS — BP 126/78 | HR 78 | Temp 97.9°F | Resp 16 | Ht 61.5 in | Wt 232.2 lb

## 2022-09-06 DIAGNOSIS — J0140 Acute pansinusitis, unspecified: Secondary | ICD-10-CM | POA: Diagnosis not present

## 2022-09-06 DIAGNOSIS — F419 Anxiety disorder, unspecified: Secondary | ICD-10-CM | POA: Diagnosis not present

## 2022-09-06 DIAGNOSIS — F32A Depression, unspecified: Secondary | ICD-10-CM | POA: Diagnosis not present

## 2022-09-06 MED ORDER — AMOXICILLIN 875 MG PO TABS
875.0000 mg | ORAL_TABLET | Freq: Two times a day (BID) | ORAL | 0 refills | Status: AC
  Filled 2022-09-06: qty 20, 10d supply, fill #0

## 2022-09-06 NOTE — Patient Instructions (Addendum)
Start amoxicillin. Continue supportive measures including rest, hydration, humidifier use, steam showers, warm compresses to sinuses, warm liquids with lemon and honey, and over-the-counter cough, cold, and analgesics as needed.  Please contact office for follow-up if symptoms do not improve or worsen. Seek emergency care if symptoms become severe.    Safe Medications in Pregnancy   Acne: Benzoyl Peroxide Salicylic Acid  Backache/Headache: Tylenol: 2 regular strength every 4 hours OR              2 Extra strength every 6 hours  Colds/Coughs/Allergies: Benadryl (alcohol free) 25 mg every 6 hours as needed Breath right strips Claritin Cepacol throat lozenges Chloraseptic throat spray Cold-Eeze- up to three times per day Cough drops, alcohol free Flonase (by prescription only) Guaifenesin Mucinex Robitussin DM (plain only, alcohol free) Saline nasal spray/drops Sudafed (pseudoephedrine) & Actifed ** use only after [redacted] weeks gestation and if you do not have high blood pressure Tylenol Vicks Vaporub Zinc lozenges Zyrtec   Constipation: Colace Ducolax suppositories Fleet enema Glycerin suppositories Metamucil Milk of magnesia Miralax Senokot Smooth move tea  Diarrhea: Kaopectate Imodium A-D  *NO pepto Bismol  Hemorrhoids: Anusol Anusol HC Preparation H Tucks  Indigestion: Tums Maalox Mylanta Zantac  Pepcid  Insomnia: Benadryl (alcohol free) 97m every 6 hours as needed Tylenol PM Unisom, no Gelcaps  Leg Cramps: Tums MagGel  Nausea/Vomiting:  Bonine Dramamine Emetrol Ginger extract Sea bands Meclizine  Nausea medication to take during pregnancy:  Unisom (doxylamine succinate 25 mg tablets) Take one tablet daily at bedtime. If symptoms are not adequately controlled, the dose can be increased to a maximum recommended dose of two tablets daily (1/2 tablet in the morning, 1/2 tablet mid-afternoon and one at bedtime). Vitamin B6 103mtablet once a  day or 1/2 tablet twice a day.  Skin Rashes: Aveeno products Benadryl cream or 2557mvery 6 hours as needed Calamine Lotion 1% cortisone cream  Yeast infection: Gyne-lotrimin 7 Monistat 7   **If taking multiple medications, please check labels to avoid duplicating the same active ingredients **take medication as directed on the label ** Do not exceed 4000 mg of tylenol in 24 hours **Do not take medications that contain aspirin or ibuprofen

## 2022-09-06 NOTE — Progress Notes (Signed)
Acute Office Visit  Subjective:     Patient ID: Natasha Rice, female    DOB: November 18, 1985, 37 y.o.   MRN: [id number]  Chief Complaint  Patient presents with   discuss medication   ear pressure   Cough   Nasal Congestion     Patient is in today for 96-monthmood follow-up and URI. 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    Mood follow-up: - Diagnosis: anxiety and depression - Treatment: Effexor - Medication side effects: no - SI/HI: no - Update: Doing well. Her grandfather died in N11-25-24 but she is doing well overall. She is [redacted] weeks pregnant , following with GYN. They are fine with her staying on Effexor.    URI: Patient reports she has had 8+ days of productive cough with occasional yellow/green sputum, nasal congestion, occasional shortness of breath and wheezing, bilateral ear pressure/popping, headaches, sinus pressure, low grade fevers. She has not been getting any improvement with Mucinex, Flonase, saline nasal spray. She denies any GI/GU symptoms, chest pain.     All review of systems negative except what is listed in the HPI      Objective:    BP 126/78   Pulse 78   Temp 97.9 F (36.6 C)   Resp 16   Ht 5' 1.5" (1.562 m)   Wt 232 lb 3.2 oz (105.3 kg)   LMP 03/31/2022   SpO2 99%   BMI 43.16 kg/m    Physical Exam Vitals reviewed.  Constitutional:      General: She is not in acute distress.    Appearance: Normal appearance. She is obese. She is not ill-appearing.  HENT:     Head: Normocephalic and atraumatic.     Right Ear: Tympanic membrane normal.     Left Ear: Tympanic membrane normal.     Nose: Congestion present.     Mouth/Throat:     Mouth: Mucous membranes are moist.     Pharynx: No oropharyngeal exudate or posterior oropharyngeal erythema.  Cardiovascular:     Rate and Rhythm: Normal rate and regular rhythm.     Pulses: Normal pulses.     Heart sounds: Normal heart sounds.  Pulmonary:     Effort:  Pulmonary effort is normal.     Breath sounds: Normal breath sounds.  Musculoskeletal:     Cervical back: Normal range of motion and neck supple. No tenderness.  Lymphadenopathy:     Cervical: No cervical adenopathy.  Skin:    General: Skin is warm and dry.  Neurological:     Mental Status: She is alert and oriented to person, place, and time.  Psychiatric:        Mood and Affect: Mood normal.        Behavior: Behavior normal.        Thought Content: Thought content normal.        Judgment: Judgment normal.        No results found for any visits on 09/06/22.      Assessment & Plan:   Problem List Items Addressed This Visit       Other   Anxiety and depression Stable on Effexor 75 mg daily. No SI/HI. States she does not need a refill at this time.   Other Visit Diagnoses     Acute non-recurrent pansinusitis    -  Primary Start amoxicillin. Continue supportive measures including rest, hydration, humidifier use, steam showers, warm compresses to sinuses, warm  liquids with lemon and honey, and over-the-counter cough, cold, and analgesics as needed.  Please contact office for follow-up if symptoms do not improve or worsen. Seek emergency care if symptoms become severe.   Relevant Medications   amoxicillin (AMOXIL) 875 MG tablet       Meds ordered this encounter  Medications   amoxicillin (AMOXIL) 875 MG tablet    Sig: Take 1 tablet (875 mg total) by mouth 2 (two) times daily for 10 days.    Dispense:  20 tablet    Refill:  0    Order Specific Question:   Supervising Provider    Answer:   Penni Homans A [id number]    Return in about 6 months (around 03/07/2023) for physical.  Terrilyn Saver, NP

## 2022-09-10 ENCOUNTER — Other Ambulatory Visit (HOSPITAL_COMMUNITY): Payer: Self-pay

## 2022-09-10 MED ORDER — PROMETHAZINE HCL 25 MG PO TABS
25.0000 mg | ORAL_TABLET | Freq: Four times a day (QID) | ORAL | 0 refills | Status: DC | PRN
  Filled 2022-09-10: qty 5, 1d supply, fill #0
  Filled 2022-09-10: qty 25, 7d supply, fill #0

## 2022-09-11 DIAGNOSIS — O210 Mild hyperemesis gravidarum: Secondary | ICD-10-CM | POA: Diagnosis not present

## 2022-09-11 DIAGNOSIS — Z3A24 24 weeks gestation of pregnancy: Secondary | ICD-10-CM | POA: Diagnosis not present

## 2022-09-12 ENCOUNTER — Other Ambulatory Visit: Payer: Self-pay | Admitting: Hematology and Oncology

## 2022-09-12 DIAGNOSIS — D500 Iron deficiency anemia secondary to blood loss (chronic): Secondary | ICD-10-CM

## 2022-09-13 ENCOUNTER — Encounter (INDEPENDENT_AMBULATORY_CARE_PROVIDER_SITE_OTHER): Payer: Self-pay | Admitting: Family Medicine

## 2022-09-13 ENCOUNTER — Inpatient Hospital Stay: Payer: 59 | Attending: Hematology

## 2022-09-13 ENCOUNTER — Ambulatory Visit (INDEPENDENT_AMBULATORY_CARE_PROVIDER_SITE_OTHER): Payer: 59 | Admitting: Family Medicine

## 2022-09-13 VITALS — BP 126/84 | HR 82 | Temp 98.1°F | Ht 61.0 in | Wt 229.0 lb

## 2022-09-13 DIAGNOSIS — Z833 Family history of diabetes mellitus: Secondary | ICD-10-CM | POA: Diagnosis not present

## 2022-09-13 DIAGNOSIS — Z814 Family history of other substance abuse and dependence: Secondary | ICD-10-CM | POA: Insufficient documentation

## 2022-09-13 DIAGNOSIS — Z8249 Family history of ischemic heart disease and other diseases of the circulatory system: Secondary | ICD-10-CM | POA: Diagnosis not present

## 2022-09-13 DIAGNOSIS — Z818 Family history of other mental and behavioral disorders: Secondary | ICD-10-CM | POA: Insufficient documentation

## 2022-09-13 DIAGNOSIS — O99012 Anemia complicating pregnancy, second trimester: Secondary | ICD-10-CM | POA: Insufficient documentation

## 2022-09-13 DIAGNOSIS — Z79899 Other long term (current) drug therapy: Secondary | ICD-10-CM | POA: Insufficient documentation

## 2022-09-13 DIAGNOSIS — E559 Vitamin D deficiency, unspecified: Secondary | ICD-10-CM

## 2022-09-13 DIAGNOSIS — Z825 Family history of asthma and other chronic lower respiratory diseases: Secondary | ICD-10-CM | POA: Insufficient documentation

## 2022-09-13 DIAGNOSIS — R110 Nausea: Secondary | ICD-10-CM | POA: Insufficient documentation

## 2022-09-13 DIAGNOSIS — Z1331 Encounter for screening for depression: Secondary | ICD-10-CM

## 2022-09-13 DIAGNOSIS — Z8261 Family history of arthritis: Secondary | ICD-10-CM | POA: Insufficient documentation

## 2022-09-13 DIAGNOSIS — R5383 Other fatigue: Secondary | ICD-10-CM | POA: Insufficient documentation

## 2022-09-13 DIAGNOSIS — K912 Postsurgical malabsorption, not elsewhere classified: Secondary | ICD-10-CM | POA: Insufficient documentation

## 2022-09-13 DIAGNOSIS — F32A Depression, unspecified: Secondary | ICD-10-CM | POA: Insufficient documentation

## 2022-09-13 DIAGNOSIS — Z3A23 23 weeks gestation of pregnancy: Secondary | ICD-10-CM

## 2022-09-13 DIAGNOSIS — Z811 Family history of alcohol abuse and dependence: Secondary | ICD-10-CM | POA: Diagnosis not present

## 2022-09-13 DIAGNOSIS — D509 Iron deficiency anemia, unspecified: Secondary | ICD-10-CM | POA: Diagnosis present

## 2022-09-13 DIAGNOSIS — F419 Anxiety disorder, unspecified: Secondary | ICD-10-CM | POA: Insufficient documentation

## 2022-09-13 DIAGNOSIS — F411 Generalized anxiety disorder: Secondary | ICD-10-CM

## 2022-09-13 DIAGNOSIS — R0602 Shortness of breath: Secondary | ICD-10-CM

## 2022-09-13 DIAGNOSIS — Z9884 Bariatric surgery status: Secondary | ICD-10-CM | POA: Insufficient documentation

## 2022-09-13 DIAGNOSIS — Z3A24 24 weeks gestation of pregnancy: Secondary | ICD-10-CM | POA: Insufficient documentation

## 2022-09-13 DIAGNOSIS — I10 Essential (primary) hypertension: Secondary | ICD-10-CM

## 2022-09-13 DIAGNOSIS — Z6841 Body Mass Index (BMI) 40.0 and over, adult: Secondary | ICD-10-CM | POA: Diagnosis not present

## 2022-09-13 DIAGNOSIS — Z823 Family history of stroke: Secondary | ICD-10-CM | POA: Insufficient documentation

## 2022-09-13 DIAGNOSIS — D500 Iron deficiency anemia secondary to blood loss (chronic): Secondary | ICD-10-CM

## 2022-09-13 LAB — CMP (CANCER CENTER ONLY)
ALT: 28 U/L (ref 0–44)
AST: 21 U/L (ref 15–41)
Albumin: 3.5 g/dL (ref 3.5–5.0)
Alkaline Phosphatase: 60 U/L (ref 38–126)
Anion gap: 7 (ref 5–15)
BUN: 7 mg/dL (ref 6–20)
CO2: 25 mmol/L (ref 22–32)
Calcium: 8.7 mg/dL — ABNORMAL LOW (ref 8.9–10.3)
Chloride: 103 mmol/L (ref 98–111)
Creatinine: 0.57 mg/dL (ref 0.44–1.00)
GFR, Estimated: >60 mL/min (ref >60)
Glucose, Bld: 96 mg/dL (ref 70–99)
Potassium: 4 mmol/L (ref 3.5–5.1)
Sodium: 135 mmol/L (ref 135–145)
Total Bilirubin: 0.3 mg/dL (ref 0.3–1.2)
Total Protein: 6.4 g/dL — ABNORMAL LOW (ref 6.5–8.1)

## 2022-09-13 LAB — CBC WITH DIFFERENTIAL (CANCER CENTER ONLY)
Abs Immature Granulocytes: 0.03 10*3/uL (ref 0.00–0.07)
Basophils Absolute: 0 10*3/uL (ref 0.0–0.1)
Basophils Relative: 0 %
Eosinophils Absolute: 0.1 10*3/uL (ref 0.0–0.5)
Eosinophils Relative: 1 %
HCT: 36 % (ref 36.0–46.0)
Hemoglobin: 11.7 g/dL — ABNORMAL LOW (ref 12.0–15.0)
Immature Granulocytes: 0 %
Lymphocytes Relative: 19 %
Lymphs Abs: 1.5 10*3/uL (ref 0.7–4.0)
MCH: 23.6 pg — ABNORMAL LOW (ref 26.0–34.0)
MCHC: 32.5 g/dL (ref 30.0–36.0)
MCV: 72.7 fL — ABNORMAL LOW (ref 80.0–100.0)
Monocytes Absolute: 0.7 10*3/uL (ref 0.1–1.0)
Monocytes Relative: 8 %
Neutro Abs: 5.7 10*3/uL (ref 1.7–7.7)
Neutrophils Relative %: 72 %
Platelet Count: 220 10*3/uL (ref 150–400)
RBC: 4.95 MIL/uL (ref 3.87–5.11)
RDW: 14.7 % (ref 11.5–15.5)
WBC Count: 7.9 10*3/uL (ref 4.0–10.5)
nRBC: 0 % (ref 0.0–0.2)

## 2022-09-13 LAB — RETIC PANEL
Immature Retic Fract: 14.1 % (ref 2.3–15.9)
RBC.: 4.93 MIL/uL (ref 3.87–5.11)
Retic Count, Absolute: 79.9 10*3/uL (ref 19.0–186.0)
Retic Ct Pct: 1.6 % (ref 0.4–3.1)
Reticulocyte Hemoglobin: 26.5 pg — ABNORMAL LOW (ref >27.9)

## 2022-09-13 LAB — IRON AND IRON BINDING CAPACITY (CC-WL,HP ONLY)
Iron: 65 ug/dL (ref 28–170)
Saturation Ratios: 19 % (ref 10.4–31.8)
TIBC: 351 ug/dL (ref 250–450)
UIBC: 286 ug/dL (ref 148–442)

## 2022-09-13 LAB — FERRITIN: Ferritin: 270 ng/mL (ref 11–307)

## 2022-09-14 LAB — PREALBUMIN: PREALBUMIN: 22 mg/dL (ref 14–35)

## 2022-09-14 LAB — VITAMIN D 25 HYDROXY (VIT D DEFICIENCY, FRACTURES): Vit D, 25-Hydroxy: 47.3 ng/mL (ref 30.0–100.0)

## 2022-09-14 LAB — TSH: TSH: 0.925 u[IU]/mL (ref 0.450–4.500)

## 2022-09-14 LAB — LIPID PANEL
Chol/HDL Ratio: 2.5 ratio (ref 0.0–4.4)
Cholesterol, Total: 203 mg/dL — ABNORMAL HIGH (ref 100–199)
HDL: 81 mg/dL (ref >39)
LDL Chol Calc (NIH): 101 mg/dL — ABNORMAL HIGH (ref 0–99)
Triglycerides: 124 mg/dL (ref 0–149)
VLDL Cholesterol Cal: 21 mg/dL (ref 5–40)

## 2022-09-14 LAB — HEMOGLOBIN A1C
Est. average glucose Bld gHb Est-mCnc: 105 mg/dL
Hgb A1c MFr Bld: 5.3 % (ref 4.8–5.6)

## 2022-09-14 LAB — INSULIN, RANDOM: INSULIN: 6.2 u[IU]/mL (ref 2.6–24.9)

## 2022-09-14 LAB — T4, FREE: Free T4: 0.91 ng/dL (ref 0.82–1.77)

## 2022-09-14 LAB — FOLATE: Folate: 10.4 ng/mL (ref >3.0)

## 2022-09-14 LAB — VITAMIN B12: Vitamin B-12: 366 pg/mL (ref 232–1245)

## 2022-09-20 ENCOUNTER — Inpatient Hospital Stay: Payer: 59

## 2022-09-20 ENCOUNTER — Inpatient Hospital Stay (HOSPITAL_BASED_OUTPATIENT_CLINIC_OR_DEPARTMENT_OTHER): Payer: 59 | Admitting: Hematology and Oncology

## 2022-09-20 VITALS — BP 127/85 | HR 80 | Temp 97.5°F | Resp 16 | Wt 236.5 lb

## 2022-09-20 DIAGNOSIS — D500 Iron deficiency anemia secondary to blood loss (chronic): Secondary | ICD-10-CM

## 2022-09-20 DIAGNOSIS — O99012 Anemia complicating pregnancy, second trimester: Secondary | ICD-10-CM | POA: Diagnosis not present

## 2022-09-20 NOTE — Progress Notes (Signed)
[place] Telephone:([phone]   [phone]  PROGRESS NOTE  Patient Care Team: Terrilyn Saver, NP as PCP - General (Family Medicine) Frazier Butt., MD (Obstetrics and Gynecology)  Hematological/Oncological History # Iron Deficiency Anemia in Setting of Pregnancy   07/16/2022: establish care with Dr. Lorenso Courier. Hgb 11.1, Sat 8% with ferritin of 35.  08/02/2022 to 08/09/2022: received 2 doses of IV feraheme 510 mg  Interval History:  Natasha Rice 37 y.o. female with medical history significant for iron deficiency anemia in pregnancy who presents for a follow up visit. The patient's last visit was on 07/16/2022. In the interim since the last visit she received 2 doses of feraheme on 08/02/2022 and 08/09/2022.  On exam today Natasha Rice reports she tolerated her iron fusions well without any major side effects.  She reports that she did get a boost of energy for about 2 weeks but unfortunately she began feeling the coldness and tingling in her hands and feet afterwards.  She reports that she does have a little bit of gum bleeding when she brushes her teeth but no bright red blood in the stool or urine.  She reports she is doing her best to try to eat red meat and green leafy vegetables but has been "hit and miss" due to the new bouts of nausea that she is having.  She reports that she went to the emergency department and was given fluids as well as Phenergan but has not helped much.  She is eating smaller meals in order to try to cope with this nausea.  OB is currently evaluating her for this.  She notes that she is not otherwise having any fevers, chills, sweats, diarrhea, constipation or other issues.  Her pregnancy is otherwise on track for a delivery on 6/5 or 6/6 with a C-section.  A full 10 point ROS is otherwise negative.  MEDICAL HISTORY:  Past Medical History:  Diagnosis Date   Allergy    Anemia    Anxiety    Constipation    Depression    GERD  (gastroesophageal reflux disease)    Hypertension    Polycystic ovarian disease    Seasonal allergies    Sleep apnea    Vitamin D deficiency     SURGICAL HISTORY: Past Surgical History:  Procedure Laterality Date   CESAREAN SECTION     GASTRIC BYPASS     TONSILLECTOMY      SOCIAL HISTORY: Social History   Socioeconomic History   Marital status: Single    Spouse name: Not on file   Number of children: Not on file   Years of education: Not on file   Highest education level: Not on file  Occupational History   Not on file  Tobacco Use   Smoking status: Never   Smokeless tobacco: Never  Vaping Use   Vaping Use: Never used  Substance and Sexual Activity   Alcohol use: Not Currently    Alcohol/week: 2.0 standard drinks of alcohol    Types: 2 Glasses of wine per week    Comment: Occ   Drug use: No   Sexual activity: Yes    Birth control/protection: Condom  Other Topics Concern   Not on file  Social History Narrative   Not on file   Social Determinants of Health   Financial Resource Strain: Not on file  Food Insecurity: Not on file  Transportation Needs: Not on file  Physical Activity: Not on file  Stress: Not  on file  Social Connections: Not on file  Intimate Partner Violence: Not on file    FAMILY HISTORY: Family History  Problem Relation Age of Onset   Stroke Mother    Heart disease Mother    Drug abuse Mother    Alcohol abuse Mother    Depression Mother    Bipolar disorder Mother    Alcoholism Mother    Alcoholism Father    Drug abuse Father    Diabetes Father    Hypertension Father    Hyperlipidemia Father    Schizophrenia Father    Miscarriages / Stillbirths Sister    Hypertension Sister    Early death Sister    Intellectual disability Maternal Grandmother    Arthritis Maternal Grandmother    Hypertension Maternal Grandmother    Intellectual disability Maternal Grandfather    Hypertension Maternal Grandfather    COPD Maternal Grandfather     Alcohol abuse Maternal Grandfather    Heart attack Maternal Grandfather    Diabetes Paternal Grandmother    Arthritis Paternal Grandmother    Hypertension Paternal Grandmother    Intellectual disability Paternal Grandmother    Dementia Paternal Grandmother    Alcohol abuse Paternal Grandfather    Intellectual disability Paternal Grandfather    Congestive Heart Failure Son     ALLERGIES:  has No Known Allergies.  MEDICATIONS:  Current Outpatient Medications  Medication Sig Dispense Refill   linaclotide (LINZESS) 290 MCG CAPS capsule Take 1 capsule (290 mcg total) by mouth daily. 90 capsule 3   loratadine (CLARITIN) 10 MG tablet Take by mouth.     NIFEdipine (PROCARDIA-XL/NIFEDICAL-XL) 30 MG 24 hr tablet Take 1 tablet (30 mg total) by mouth daily. 60 tablet 2   Prenatal Vit-Fe Fumarate-FA (MULTIVITAMIN-PRENATAL) 27-0.8 MG TABS tablet Take 1 tablet by mouth daily at 12 noon.     promethazine (PHENERGAN) 25 MG tablet Take 1 tablet (25 mg total) by mouth every 6 (six) hours as needed for up to 7 days for nausea 30 tablet 0   valACYclovir (VALTREX) 500 MG tablet Take 1 tablet (500 mg total) by mouth 2 (two) times daily for 3 days as needed 30 tablet 5   venlafaxine XR (EFFEXOR XR) 75 MG 24 hr capsule Take 1 capsule (75 mg total) by mouth daily with breakfast. 90 capsule 0   Vitamin D, Ergocalciferol, (DRISDOL) 1.25 MG (50000 UNIT) CAPS capsule Take 1 capsule (50,000 Units total) by mouth once a week. 12 capsule 11   No current facility-administered medications for this visit.    REVIEW OF SYSTEMS:   Constitutional: ( - ) fevers, ( - )  chills , ( - ) night sweats Eyes: ( - ) blurriness of vision, ( - ) double vision, ( - ) watery eyes Ears, nose, mouth, throat, and face: ( - ) mucositis, ( - ) sore throat Respiratory: ( - ) cough, ( - ) dyspnea, ( - ) wheezes Cardiovascular: ( - ) palpitation, ( - ) chest discomfort, ( - ) lower extremity swelling Gastrointestinal:  ( - ) nausea, ( -  ) heartburn, ( - ) change in bowel habits Skin: ( - ) abnormal skin rashes Lymphatics: ( - ) new lymphadenopathy, ( - ) easy bruising Neurological: ( - ) numbness, ( - ) tingling, ( - ) new weaknesses Behavioral/Psych: ( - ) mood change, ( - ) new changes  All other systems were reviewed with the patient and are negative.  PHYSICAL EXAMINATION:  Vitals:   09/20/22 1455  BP: 127/85  Pulse: 80  Resp: 16  Temp: (!) 97.5 F (36.4 C)  SpO2: 100%   Filed Weights   09/20/22 1455  Weight: 236 lb 8 oz (107.3 kg)    GENERAL: well appearing young African [place] female, alert, no distress and comfortable SKIN: skin color, texture, turgor are normal, no rashes or significant lesions EYES: conjunctiva are pink and non-injected, sclera clear LUNGS: clear to auscultation and percussion with normal breathing effort HEART: regular rate & rhythm and no murmurs and no lower extremity edema Musculoskeletal: no cyanosis of digits and no clubbing  ABDOMEN: pregnant PSYCH: alert & oriented x 3, fluent speech NEURO: no focal motor/sensory deficits  LABORATORY DATA:  I have reviewed the data as listed    Latest Ref Rng & Units 09/13/2022    2:54 PM 07/16/2022    2:40 PM 04/22/2022    5:23 PM  CBC  WBC 4.0 - 10.5 K/uL 7.9  8.6  6.7   Hemoglobin 12.0 - 15.0 g/dL 11.7  11.1  11.7   Hematocrit 36.0 - 46.0 % 36.0  34.3  37.3   Platelets 150 - 400 K/uL 220  243  230        Latest Ref Rng & Units 09/13/2022    2:54 PM 07/16/2022    2:40 PM 04/22/2022    5:23 PM  CMP  Glucose 70 - 99 mg/dL 96  101  94   BUN 6 - 20 mg/dL '7  8  13   '$ Creatinine 0.44 - 1.00 mg/dL 0.57  0.62  0.81   Sodium 135 - 145 mmol/L 135  136  137   Potassium 3.5 - 5.1 mmol/L 4.0  3.9  4.9   Chloride 98 - 111 mmol/L 103  104  105   CO2 22 - 32 mmol/L '25  27  26   '$ Calcium 8.9 - 10.3 mg/dL 8.7  9.0  9.3   Total Protein 6.5 - 8.1 g/dL 6.4  6.7    Total Bilirubin 0.3 - 1.2 mg/dL 0.3  0.3    Alkaline Phos 38 - 126 U/L 60  66     AST 15 - 41 U/L 21  13    ALT 0 - 44 U/L 28  12     RADIOGRAPHIC STUDIES: No results found.  ASSESSMENT & PLAN Natasha Rice 37 y.o. female with medical history significant for iron deficiency anemia in pregnancy who presents for a follow up visit.  # Iron Deficiency Anemia in Setting of Pregnancy   -- Findings are consistent with iron deficiency anemia secondary to patient's menorrhagia --Encouraged her to follow-up with OB/GYN for better control of her menstrual cycles following her delivery. -- Patient unable to tolerate ferrous sulfate --Patient currently being pregnant and due in June 2024. --patient received 2 doses of IV Feraheme with normalization of her iron levels, but she has persistently low Hgb and MCV. Possible underlying hemoglobinopathy.  --labs today show white blood cell 7.9, hemoglobin 9.7, MCV 72.7, and platelets of 220 --Plan for return to clinic in 6 months to re-evaluate post delivery.   Orders Placed This Encounter  Procedures   Hgb Fractionation Cascade    Standing Status:   Future    Number of Occurrences:   1    Standing Expiration Date:   09/21/2023   Alpha-Thalassemia GenotypR    Standing Status:   Future    Number of Occurrences:   1    Standing Expiration Date:   09/21/2023  All questions were answered. The patient knows to call the clinic with any problems, questions or concerns.  A total of more than 30 minutes were spent on this encounter with face-to-face time and non-face-to-face time, including preparing to see the patient, ordering tests and/or medications, counseling the patient and coordination of care as outlined above.   Ledell Peoples, MD Department of Hematology/Oncology [place] [facility] Phone: [phone] Pager: [phone] Email: [email or web address]  09/20/2022 3:26 PM

## 2022-09-21 ENCOUNTER — Telehealth: Payer: Self-pay | Admitting: Hematology and Oncology

## 2022-09-21 NOTE — Telephone Encounter (Signed)
Called patient per 2/26 los notes to schedule f/u. Patient scheduled and notified.

## 2022-09-23 ENCOUNTER — Encounter: Payer: Self-pay | Admitting: Hematology and Oncology

## 2022-09-23 LAB — HGB FRACTIONATION CASCADE
Hgb A2: 2.3 % (ref 1.8–3.2)
Hgb A: 97.7 % (ref 96.4–98.8)
Hgb F: 0 % (ref 0.0–2.0)
Hgb S: 0 %

## 2022-09-27 ENCOUNTER — Ambulatory Visit (INDEPENDENT_AMBULATORY_CARE_PROVIDER_SITE_OTHER): Payer: 59 | Admitting: Family Medicine

## 2022-09-27 ENCOUNTER — Encounter (INDEPENDENT_AMBULATORY_CARE_PROVIDER_SITE_OTHER): Payer: Self-pay | Admitting: Family Medicine

## 2022-09-27 VITALS — BP 114/80 | HR 96 | Temp 98.5°F | Ht 61.0 in | Wt 233.0 lb

## 2022-09-27 DIAGNOSIS — Z6841 Body Mass Index (BMI) 40.0 and over, adult: Secondary | ICD-10-CM

## 2022-09-27 DIAGNOSIS — D508 Other iron deficiency anemias: Secondary | ICD-10-CM

## 2022-09-27 DIAGNOSIS — K5901 Slow transit constipation: Secondary | ICD-10-CM | POA: Diagnosis not present

## 2022-09-27 DIAGNOSIS — K9189 Other postprocedural complications and disorders of digestive system: Secondary | ICD-10-CM | POA: Diagnosis not present

## 2022-09-27 DIAGNOSIS — Z9884 Bariatric surgery status: Secondary | ICD-10-CM

## 2022-09-27 DIAGNOSIS — Z3A26 26 weeks gestation of pregnancy: Secondary | ICD-10-CM

## 2022-09-27 NOTE — Assessment & Plan Note (Signed)
She has received 2 IV iron infusions and is followed by hematology.  Her ferritin level did improve greatly.  Her energy level still remains fairly low.  Keep follow-up visit as scheduled with hematology.

## 2022-09-27 NOTE — Assessment & Plan Note (Signed)
Reviewed labs from last visit.  Prealbumin was in the normal range at 22 indicating adequate protein intake.  She is taking a prenatal vitamin once daily.  She has adequate volume restriction with mealtime.  She has been struggling more with nausea and vomiting related to her current pregnancy.  We discussed avoiding greasy foods, large food volumes or going too long in between meals.  She may use famotidine as needed for reflux related to sleeve gastrectomy.

## 2022-09-27 NOTE — Progress Notes (Signed)
Office: [phone]  /  Fax: [phone]  WEIGHT SUMMARY AND BIOMETRICS  Vitals Temp: 98.5 F (36.9 C) BP: 114/80 Pulse Rate: 96 SpO2: 99 %   Anthropometric Measurements Height: '5\' 1"'$  (1.549 m) Weight: 233 lb (105.7 kg) BMI (Calculated): 44.05 Weight at Last Visit: 229lb Weight Lost Since Last Visit: +4 Starting Weight: 229lb Total Weight Loss (lbs): 0 lb (0 kg) Waist Measurement : 44 inches   Body Composition  Body Fat %: 52.2 % Fat Mass (lbs): 122 lbs Muscle Mass (lbs): 106.2 lbs Total Body Water (lbs): 81.6 lbs Visceral Fat Rating : 15   Other Clinical Data RMR: [id number] Fasting: no Labs: no Today's Visit #: 2 Starting Date: 09/13/22     HPI  Chief Complaint: OBESITY  Natasha Rice is here to discuss her progress with her obesity treatment plan. She is on the the Category 3 Plan and states she is following her eating plan approximately 95 % of the time. She states she is walking more at work.    Interval History:  Since last office visit she is up 4 lb She is getting in all the foods on her plan Pregnant, 7 mos due 6-12; has been having more vomiting She has been constipated- using stool softeners and miralax Drinks water outside of meals due to her sleeve gastrectomy She has not been doing regular exercise.  She plans to add in more walking.  We reviewed some high-fiber food items she may add to her plan  Pharmacotherapy: none  PHYSICAL EXAM:  Blood pressure 114/80, pulse 96, temperature 98.5 F (36.9 C), height '5\' 1"'$  (1.549 m), weight 233 lb (105.7 kg), last menstrual period 03/31/2022, SpO2 99 %. Body mass index is 44.02 kg/m.  General: She is overweight, cooperative, alert, well developed, and in no acute distress. PSYCH: Has normal mood, affect and thought process.   Lungs: Normal breathing effort, no conversational dyspnea.  DIAGNOSTIC DATA REVIEWED:  BMET    Component Value Date/Time   NA 135 09/13/2022 1454   NA 135 (A) 06/17/2020  0000   K 4.0 09/13/2022 1454   CL 103 09/13/2022 1454   CO2 25 09/13/2022 1454   GLUCOSE 96 09/13/2022 1454   BUN 7 09/13/2022 1454   BUN 15 06/17/2020 0000   CREATININE 0.57 09/13/2022 1454   CREATININE 0.89 03/28/2015 1428   CALCIUM 8.7 (L) 09/13/2022 1454   GFRNONAA >60 09/13/2022 1454   GFRNONAA 88 03/28/2015 1428   GFRAA >89 03/28/2015 1428   Lab Results  Component Value Date   HGBA1C 5.3 09/13/2022   HGBA1C 5.6 06/17/2020   Lab Results  Component Value Date   INSULIN 6.2 09/13/2022   Lab Results  Component Value Date   TSH 0.925 09/13/2022   CBC    Component Value Date/Time   WBC 7.9 09/13/2022 1454   WBC 6.7 04/22/2022 1723   RBC 4.93 09/13/2022 1454   RBC 4.95 09/13/2022 1454   HGB 11.7 (L) 09/13/2022 1454   HCT 36.0 09/13/2022 1454   PLT 220 09/13/2022 1454   MCV 72.7 (L) 09/13/2022 1454   MCH 23.6 (L) 09/13/2022 1454   MCHC 32.5 09/13/2022 1454   RDW 14.7 09/13/2022 1454   Iron Studies    Component Value Date/Time   IRON 65 09/13/2022 1454   TIBC 351 09/13/2022 1454   FERRITIN 270 09/13/2022 1454   IRONPCTSAT 19 09/13/2022 1454   Lipid Panel     Component Value Date/Time   CHOL 203 (H) 09/13/2022 [id number]  TRIG 124 09/13/2022 0843   HDL 81 09/13/2022 0843   CHOLHDL 2.5 09/13/2022 0843   CHOLHDL 3 03/19/2022 1327   VLDL 14.2 03/19/2022 1327   LDLCALC 101 (H) 09/13/2022 0843   Hepatic Function Panel     Component Value Date/Time   PROT 6.4 (L) 09/13/2022 1454   ALBUMIN 3.5 09/13/2022 1454   AST 21 09/13/2022 1454   ALT 28 09/13/2022 1454   ALKPHOS 60 09/13/2022 1454   BILITOT 0.3 09/13/2022 1454      Component Value Date/Time   TSH 0.925 09/13/2022 0843   Nutritional Lab Results  Component Value Date   VD25OH 47.3 09/13/2022   VD25OH 60.49 06/17/2020     ASSESSMENT AND PLAN  TREATMENT PLAN FOR OBESITY:  Recommended Dietary Goals  Natasha Rice is currently in the action stage of change. As such, her goal is to continue weight  management plan. She has agreed to the Category 3 Plan.  Behavioral Intervention  We discussed the following Behavioral Modification Strategies today: increasing lean protein intake, increasing vegetables, increasing fiber rich foods, avoiding skipping meals, increasing water intake, work on meal planning and easy cooking plans, and work on managing stress, creating time for self-care and relaxation measures.  Additional resources provided today: NA  Recommended Physical Activity Goals  Natasha Rice has been advised to work up to 150 minutes of moderate intensity aerobic activity a week and strengthening exercises 2-3 times per week for cardiovascular health, weight loss maintenance and preservation of muscle mass.   She has agreed to increase physical activity in their day and reduce sedentary time (increase NEAT).    Pharmacotherapy We discussed various medication options to help Natasha Rice with her weight loss efforts and we both agreed to none.  ASSOCIATED CONDITIONS ADDRESSED TODAY  Iron deficiency anemia after gastrectomy Assessment & Plan: She has received 2 IV iron infusions and is followed by hematology.  Her ferritin level did improve greatly.  Her energy level still remains fairly low.  Keep follow-up visit as scheduled with hematology.   Slow transit constipation Assessment & Plan: Patient's constipation has worsened with pregnancy and high-protein diet.  She struggles to get in adequate water due to pregnancy associated nausea and her vertical sleeve gastrectomy.  We discussed sipping on water outside of mealtime.  She has started Colace and MiraLAX on a regular basis.  She may increase her fruit servings to 2 servings a day while getting in plenty of nonstarchy vegetables with lunch and dinner.   [redacted] weeks gestation of pregnancy Assessment & Plan: She is doing well and closely followed by OB/GYN for her current pregnancy approximately 26 weeks of gestation.  She has been  compliant with taking her prenatal vitamin once daily.  Encouraged adequate nutritional intake and sleep at night.   Morbid obesity ([place]) Assessment & Plan: Reviewed bioimpedance results together.  She is currently pregnant and is expected to have a small amount of body fat gain until the end of her pregnancy.  Her fluid weight is up today likely related to current issues with constipation.  She has been able to maintain her muscle mass.   BMI 40.0-44.9, adult ([place])  S/P laparoscopic sleeve gastrectomy Assessment & Plan: Reviewed labs from last visit.  Prealbumin was in the normal range at 22 indicating adequate protein intake.  She is taking a prenatal vitamin once daily.  She has adequate volume restriction with mealtime.  She has been struggling more with nausea and vomiting related to her current pregnancy.  We  discussed avoiding greasy foods, large food volumes or going too long in between meals.  She may use famotidine as needed for reflux related to sleeve gastrectomy.       No follow-ups on file.Marland Kitchen She was informed of the importance of frequent follow up visits to maximize her success with intensive lifestyle modifications for her multiple health conditions.   ATTESTASTION STATEMENTS:  Reviewed by clinician on day of visit: allergies, medications, problem list, medical history, surgical history, family history, social history, and previous encounter notes.   I have personally spent 30 minutes total time today in preparation, patient care, nutritional counseling and documentation for this visit, including the following: review of clinical lab tests; review of medical tests/procedures/services.      Dell Ponto, DO

## 2022-09-27 NOTE — Assessment & Plan Note (Signed)
Reviewed bioimpedance results together.  She is currently pregnant and is expected to have a small amount of body fat gain until the end of her pregnancy.  Her fluid weight is up today likely related to current issues with constipation.  She has been able to maintain her muscle mass.

## 2022-09-27 NOTE — Assessment & Plan Note (Signed)
She is doing well and closely followed by OB/GYN for her current pregnancy approximately 26 weeks of gestation.  She has been compliant with taking her prenatal vitamin once daily.  Encouraged adequate nutritional intake and sleep at night.

## 2022-09-27 NOTE — Assessment & Plan Note (Signed)
Patient's constipation has worsened with pregnancy and high-protein diet.  She struggles to get in adequate water due to pregnancy associated nausea and her vertical sleeve gastrectomy.  We discussed sipping on water outside of mealtime.  She has started Colace and MiraLAX on a regular basis.  She may increase her fruit servings to 2 servings a day while getting in plenty of nonstarchy vegetables with lunch and dinner.

## 2022-09-28 ENCOUNTER — Ambulatory Visit (INDEPENDENT_AMBULATORY_CARE_PROVIDER_SITE_OTHER): Payer: 59 | Admitting: Family Medicine

## 2022-09-28 ENCOUNTER — Other Ambulatory Visit (HOSPITAL_BASED_OUTPATIENT_CLINIC_OR_DEPARTMENT_OTHER): Payer: Self-pay

## 2022-09-28 ENCOUNTER — Telehealth: Payer: Self-pay | Admitting: Family Medicine

## 2022-09-28 ENCOUNTER — Encounter: Payer: Self-pay | Admitting: Family Medicine

## 2022-09-28 VITALS — BP 126/82 | HR 104 | Temp 98.4°F | Resp 18 | Ht 61.0 in | Wt 236.6 lb

## 2022-09-28 DIAGNOSIS — J0141 Acute recurrent pansinusitis: Secondary | ICD-10-CM

## 2022-09-28 MED ORDER — CEFDINIR 300 MG PO CAPS
300.0000 mg | ORAL_CAPSULE | Freq: Two times a day (BID) | ORAL | 0 refills | Status: AC
  Filled 2022-09-28: qty 14, 7d supply, fill #0

## 2022-09-28 NOTE — Patient Instructions (Signed)
Start Omnicef antibiotic  Continue supportive measures including rest, hydration, humidifier use, steam showers, warm compresses to sinuses, warm liquids with lemon and honey, and over-the-counter cough, cold, and analgesics as needed.  Increase Flonase to twice daily and start local honey for allergy component  Please contact office for follow-up if symptoms do not improve or worsen. Seek emergency care if symptoms become severe.

## 2022-09-28 NOTE — Progress Notes (Signed)
Acute Office Visit  Subjective:     Patient ID: Natasha Rice, female    DOB: 09/23/1985, 37 y.o.   MRN: [id number]  Chief Complaint  Patient presents with   Sinus Problem    Sxs started off and on since last visit. Pt states having ear pain, heavy eyes, sob, fatigue     Patient is in today for recurrent sinusitis.    Patient reports she started feeling better after taking amoxicillin (prescribed on 09/06/22 for sinusitis), but after about 3-4 days of feeling good, symptoms returned and include sinus pressure, left ear pain, nasal congestion, dyspnea on exertion (pregnant), fatigue, chills. She has been taking Zyrtec, Flonase, cough medicine without much improvement. She denies any fevers, cough, GI/GU symptoms.      ROS All review of systems negative except what is listed in the HPI      Objective:    BP 126/82   Pulse (!) 104   Temp 98.4 F (36.9 C) (Oral)   Resp 18   Ht '5\' 1"'$  (1.549 m)   Wt 236 lb 9.6 oz (107.3 kg)   LMP 03/31/2022   SpO2 100%   BMI 44.71 kg/m    Physical Exam Vitals reviewed.  Constitutional:      Appearance: Normal appearance.  HENT:     Right Ear: Hearing, tympanic membrane, ear canal and external ear normal.     Left Ear: Ear canal and external ear normal. A middle ear effusion is present.  Cardiovascular:     Rate and Rhythm: Normal rate and regular rhythm.  Pulmonary:     Effort: Pulmonary effort is normal.     Breath sounds: Normal breath sounds.  Musculoskeletal:     Cervical back: Normal range of motion and neck supple. No tenderness.  Lymphadenopathy:     Cervical: No cervical adenopathy.  Skin:    General: Skin is warm and dry.  Neurological:     General: No focal deficit present.     Mental Status: She is alert and oriented to person, place, and time. Mental status is at baseline.  Psychiatric:        Mood and Affect: Mood normal.        Behavior: Behavior normal.        Thought Content: Thought content normal.         Judgment: Judgment normal.     No results found for any visits on 09/28/22.      Assessment & Plan:   Problem List Items Addressed This Visit   None Visit Diagnoses     Acute recurrent pansinusitis    -  Primary Start Omnicef antibiotic  Continue supportive measures including rest, hydration, humidifier use, steam showers, warm compresses to sinuses, warm liquids with lemon and honey, and over-the-counter cough, cold, and analgesics as needed.  Increase Flonase to twice daily and start local honey for allergy component Patient aware of signs/symptoms requiring further/urgent evaluation.      Relevant Medications   cefdinir (OMNICEF) 300 MG capsule       Meds ordered this encounter  Medications   cefdinir (OMNICEF) 300 MG capsule    Sig: Take 1 capsule (300 mg total) by mouth 2 (two) times daily for 7 days.    Dispense:  14 capsule    Refill:  0    Order Specific Question:   Supervising Provider    Answer:   Penni Homans A [[id number]]    Return if symptoms worsen or fail to  improve.  Terrilyn Saver, NP

## 2022-09-28 NOTE — Telephone Encounter (Signed)
error 

## 2022-09-28 NOTE — Progress Notes (Signed)
Thank you for referring Natasha Rice to our clinic. The following note includes my evaluation and treatment recommendations.  Chief Complaint:   Natasha Rice (MR# [id number]) is a 37 y.o. female who presents for evaluation and treatment of Natasha and related comorbidities. Current BMI is Body mass index is 43.27 kg/m. Natasha Rice has been struggling with her weight for many years and has been unsuccessful in either losing weight, maintaining weight loss, or reaching her healthy weight goal.  Natasha Rice lives with 2 children and her grandparents.  She snacks on chips, craves carbs and eats out 4 times a week.  Her children are picky eaters. She complains of low energy level after work with current pregnancy, which began to increase walking  Natasha Rice is currently in the action stage of change and ready to dedicate time achieving and maintaining a healthier weight. Natasha Rice is interested in becoming our patient and working on intensive lifestyle modifications including (but not limited to) diet and exercise for weight loss.  Natasha Rice's habits were reviewed today and are as follows: Her family eats meals together, she thinks her family will eat healthier with her, her desired weight loss is 59 lbs, she has been heavy most of her life, she started gaining weight after high school, her heaviest weight ever was 279 pounds, she has significant food cravings issues, she snacks frequently in the evenings, she wakes up frequently in the middle of the night to eat, she skips meals frequently, she is frequently drinking liquids with calories, she frequently makes poor food choices, and she struggles with emotional eating.  Depression Screen Natasha Rice (modified PHQ-9) score was 10.  Subjective:   1. Other fatigue Natasha Rice admits to daytime somnolence and admits to waking up still tired. Patient has a history of symptoms of daytime fatigue and morning fatigue. Natasha Rice  generally gets 7 or 8 hours of sleep per night, and states that she has nightime awakenings. Snoring is not present. Apneic episodes are not present. Epworth Sleepiness Score is 5. EKG NSR without ischemic changes.  2. SOBOE (shortness of breath on exertion) Natasha Rice notes increasing shortness of breath with exercising and seems to be worsening over time with weight gain. She notes getting out of breath sooner with activity than she used to. This has not gotten worse recently. Natasha Rice denies shortness of breath at rest or orthopnea.  3. GAD (generalized anxiety disorder) Patient is on venlafaxine 75 mg daily.  Bariatric PHQ-9:10 Patient has a good support system.  4. Essential hypertension Blood pressure well-controlled without blood pressure medications.  OB/GYN is following her blood pressures with current pregnancy.  5. Vitamin D deficiency Patient is on prescription vitamin D 50,000 IU weekly.  Patient reports energy level is low.  6. [redacted] weeks gestation of pregnancy Patient is prenatal vitamin daily.  Patient is pregnant with this..  Patient does have problems with hyperemesis.  7. S/P laparoscopic sleeve gastrectomy Procedure done 05/2016 with [facility].  Preop weight 220 LBS, nadir weight 160 LBS.  Assessment/Plan:   1. Other fatigue Natasha Rice does feel that her weight is causing her energy to be lower than it should be. Fatigue may be related to Natasha, depression or many other causes. Labs will be ordered, and in the meanwhile, Natasha Rice will focus on self care including making healthy food choices, increasing physical activity and focusing on stress reduction.  - EKG 12-Lead - VITAMIN D 25 Hydroxy (Vit-D Deficiency, Fractures) - TSH - T4, free -  Lipid panel - Insulin, random - Hemoglobin A1c - Folate - Vitamin B12  2. SOBOE (shortness of breath on exertion) Natasha Rice does feel that she gets out of breath more easily that she used to when she exercises. Natasha Rice's  shortness of breath appears to be Natasha related and exercise induced. She has agreed to work on weight loss and gradually increase exercise to treat her exercise induced shortness of breath. Will continue to monitor closely.  3. GAD (generalized anxiety disorder) Work on stress reduction continue current medications.  4. Essential hypertension Continue to limit high sodium foods.  5. Vitamin D deficiency Check vitamin D level today.  - VITAMIN D 25 Hydroxy (Vit-D Deficiency, Fractures)  6. [redacted] weeks gestation of pregnancy Patient will work on keeping weight stable pregnancy.  Has Zofran for as needed use.  7. S/P laparoscopic sleeve gastrectomy Eat five small meals a day due to volume restriction and nausea.  Update labs today.  - Prealbumin  8. Depression screen Natasha Rice had a positive depression screening. Depression is commonly associated with Natasha and often results in emotional eating behaviors. We will monitor this closely and work on CBT to help improve the non-hunger eating patterns. Referral to Psychology may be required if no improvement is seen as she continues in our clinic.  9. Morbid Natasha (Natasha Rice)  10. Natasha,current BMI 43.4 100-calorie snack list given.  Natasha Rice is currently in the action stage of change and her goal is to continue with weight loss efforts. I recommend Natasha Rice begin the structured treatment plan as follows:  She has agreed to the Category 3 Plan.  Exercise goals: Tracking daily steps.  Behavioral modification strategies: increasing lean protein intake, increasing vegetables, increasing water intake, increasing high fiber foods, decreasing eating out, no skipping meals, meal planning and cooking strategies, keeping healthy foods in the home, better snacking choices, and planning for success.  She was informed of the importance of frequent follow-up visits to maximize her success with intensive lifestyle modifications for her multiple health  conditions. She was informed we would discuss her lab results at her next visit unless there is a critical issue that needs to be addressed sooner. Natasha Rice agreed to keep her next visit at the agreed upon time to discuss these results.  Objective:   Blood pressure 126/84, pulse 82, temperature 98.1 F (36.7 C), height '5\' 1"'$  (1.549 m), weight 229 lb (103.9 kg), last menstrual period 03/31/2022, SpO2 99 %. Body mass index is 43.27 kg/m.  EKG: Normal sinus rhythm, rate 85 bpm.  Indirect Calorimeter completed today shows a VO2 of 233 and a REE of 1613.  Her calculated basal metabolic rate is 123456 thus her basal metabolic rate is worse than expected.  General: Cooperative, alert, well developed, in no acute distress. HEENT: Conjunctivae and lids unremarkable. Cardiovascular: Regular rhythm.  Lungs: Normal work of breathing. Neurologic: No focal deficits.   Lab Results  Component Value Date   CREATININE 0.57 09/13/2022   BUN 7 09/13/2022   NA 135 09/13/2022   K 4.0 09/13/2022   CL 103 09/13/2022   CO2 25 09/13/2022   Lab Results  Component Value Date   ALT 28 09/13/2022   AST 21 09/13/2022   ALKPHOS 60 09/13/2022   BILITOT 0.3 09/13/2022   Lab Results  Component Value Date   HGBA1C 5.3 09/13/2022   HGBA1C 5.6 06/17/2020   Lab Results  Component Value Date   INSULIN 6.2 09/13/2022   Lab Results  Component Value Date  TSH 0.925 09/13/2022   Lab Results  Component Value Date   CHOL 203 (H) 09/13/2022   HDL 81 09/13/2022   LDLCALC 101 (H) 09/13/2022   TRIG 124 09/13/2022   CHOLHDL 2.5 09/13/2022   Lab Results  Component Value Date   WBC 7.9 09/13/2022   HGB 11.7 (L) 09/13/2022   HCT 36.0 09/13/2022   MCV 72.7 (L) 09/13/2022   PLT 220 09/13/2022   Lab Results  Component Value Date   IRON 65 09/13/2022   TIBC 351 09/13/2022   FERRITIN 270 09/13/2022   Attestation Statements:   Reviewed by clinician on day of visit: allergies, medications, problem list,  medical history, surgical history, family history, social history, and previous encounter notes.  I have personally spent 30 minutes total time today in preparation, patient care, nutritional counseling and documentation for this visit, including the following: review of clinical lab tests; review of medical tests/procedures/services.    I, Davy Pique, am acting as Location manager for Loyal Gambler, DO.  I have reviewed the above documentation for accuracy and completeness, and I agree with the above. Dell Ponto, DO

## 2022-10-05 LAB — ALPHA-THALASSEMIA GENOTYPR

## 2022-10-10 ENCOUNTER — Other Ambulatory Visit (HOSPITAL_COMMUNITY): Payer: Self-pay

## 2022-10-12 ENCOUNTER — Other Ambulatory Visit (HOSPITAL_COMMUNITY): Payer: Self-pay

## 2022-10-18 ENCOUNTER — Other Ambulatory Visit (HOSPITAL_BASED_OUTPATIENT_CLINIC_OR_DEPARTMENT_OTHER): Payer: Self-pay

## 2022-10-18 ENCOUNTER — Telehealth (INDEPENDENT_AMBULATORY_CARE_PROVIDER_SITE_OTHER): Payer: Self-pay | Admitting: Family Medicine

## 2022-10-18 ENCOUNTER — Other Ambulatory Visit (HOSPITAL_COMMUNITY): Payer: Self-pay

## 2022-10-18 ENCOUNTER — Other Ambulatory Visit: Payer: Self-pay | Admitting: Family Medicine

## 2022-10-18 ENCOUNTER — Other Ambulatory Visit (INDEPENDENT_AMBULATORY_CARE_PROVIDER_SITE_OTHER): Payer: Self-pay | Admitting: Family Medicine

## 2022-10-18 DIAGNOSIS — E559 Vitamin D deficiency, unspecified: Secondary | ICD-10-CM

## 2022-10-18 DIAGNOSIS — F32A Depression, unspecified: Secondary | ICD-10-CM

## 2022-10-18 MED ORDER — VENLAFAXINE HCL ER 75 MG PO CP24
75.0000 mg | ORAL_CAPSULE | Freq: Every day | ORAL | 0 refills | Status: DC
  Filled 2022-10-18: qty 90, 90d supply, fill #0

## 2022-10-18 MED ORDER — VITAMIN D (ERGOCALCIFEROL) 1.25 MG (50000 UNIT) PO CAPS
50000.0000 [IU] | ORAL_CAPSULE | ORAL | 0 refills | Status: DC
  Filled 2022-10-18 – 2022-10-20 (×2): qty 12, 84d supply, fill #0

## 2022-10-18 NOTE — Telephone Encounter (Signed)
3/25 Patient is calling about an update on vitamin D 90 day supply refilll. je.

## 2022-10-19 ENCOUNTER — Other Ambulatory Visit: Payer: Self-pay

## 2022-10-19 ENCOUNTER — Other Ambulatory Visit (HOSPITAL_COMMUNITY): Payer: Self-pay

## 2022-10-20 ENCOUNTER — Other Ambulatory Visit (HOSPITAL_COMMUNITY): Payer: Self-pay

## 2022-10-20 ENCOUNTER — Other Ambulatory Visit (HOSPITAL_BASED_OUTPATIENT_CLINIC_OR_DEPARTMENT_OTHER): Payer: Self-pay

## 2022-11-02 ENCOUNTER — Ambulatory Visit (INDEPENDENT_AMBULATORY_CARE_PROVIDER_SITE_OTHER): Payer: 59 | Admitting: Family Medicine

## 2022-11-02 ENCOUNTER — Encounter (INDEPENDENT_AMBULATORY_CARE_PROVIDER_SITE_OTHER): Payer: Self-pay | Admitting: Family Medicine

## 2022-11-02 VITALS — BP 117/84 | HR 93 | Temp 98.2°F | Ht 61.0 in | Wt 234.0 lb

## 2022-11-02 DIAGNOSIS — E559 Vitamin D deficiency, unspecified: Secondary | ICD-10-CM

## 2022-11-02 DIAGNOSIS — K5901 Slow transit constipation: Secondary | ICD-10-CM

## 2022-11-02 DIAGNOSIS — Z9884 Bariatric surgery status: Secondary | ICD-10-CM

## 2022-11-02 DIAGNOSIS — Z6841 Body Mass Index (BMI) 40.0 and over, adult: Secondary | ICD-10-CM | POA: Diagnosis not present

## 2022-11-02 NOTE — Assessment & Plan Note (Signed)
Slow transit constipation has been worsened by her pregnancy.  Her PCP has her back on Linzess.  She has used MiraLAX as needed.  She has tried increasing her water intake and does get some fruits and vegetables and daily.  Continue current plan of care.  Look for improvements in constipation postpartum.

## 2022-11-02 NOTE — Assessment & Plan Note (Addendum)
Last vitamin D Lab Results  Component Value Date   VD25OH 47.3 09/13/2022   She is doing well her vitamin D levels, last checked in February.  She is to continue vitamin D prescription 50,000 IU once weekly.  She does not need a refill today.  Plan to recheck levels in the next 3 months postpartum.

## 2022-11-02 NOTE — Assessment & Plan Note (Signed)
She has done well with little weight gain during this pregnancy.  She is doing well with eating small portions including lean protein and fiber throughout the day, hydrating well with water and keeping up her walking time.  Encouraged her to continue tracking her daily steps.  Will see her back after she delivers in July 2024 to discuss a more active plan for weight reduction.

## 2022-11-02 NOTE — Assessment & Plan Note (Signed)
Continue a prenatal vitamin, small meals throughout the day including lean protein and fiber at mealtime.  She has supplemented with a protein shake once daily.  She denies any additional issues with heartburn, nausea or vomiting outside of her pregnancy.  Continue a prenatal vitamin once daily.  Follow-up postpartum. Plan to recheck vitamin levels postpartum.

## 2022-11-02 NOTE — Progress Notes (Signed)
Office: [phone]  /  Fax: [phone]  WEIGHT SUMMARY AND BIOMETRICS  Starting Date: 09/13/22  Starting Weight: 229lb   Weight Lost Since Last Visit: 2lb   Vitals Temp: 98.2 F (36.8 C) BP: 117/84 Pulse Rate: 93 SpO2: 100 %   Body Composition  Body Fat %: 53.5 % Fat Mass (lbs): 125.6 lbs Muscle Mass (lbs): 103.6 lbs Total Body Water (lbs): 85.6 lbs Visceral Fat Rating : 16   HPI  Chief Complaint: OBESITY  Natasha Rice is here to discuss her progress with her obesity treatment plan. She is on the the Category 3 Plan and states she is following her eating plan approximately 85 % of the time. She states she is exercising 0 minutes 0 times per week.   Interval History:  Since last office visit she is up 1 lb She is pregnant, due in early June (surrograte pregnancy) She is having more nausea and vomiting which comes in waves She has been more constipated, back on Linzess She is doing more walking at work.  She is tracking her steps with a smart watch She is doing better sipping water She is getting a protein shake daily She is taking a PNV daily  Pharmacotherapy: none  PHYSICAL EXAM:  Blood pressure 117/84, pulse 93, temperature 98.2 F (36.8 C), height 5\' 1"  (1.549 m), weight 234 lb (106.1 kg), last menstrual period 03/31/2022, SpO2 100 %. Body mass index is 44.21 kg/m.  General: She is overweight, cooperative, alert, well developed, and in no acute distress. PSYCH: Has normal mood, affect and thought process.   Lungs: Normal breathing effort, no conversational dyspnea.   ASSESSMENT AND PLAN  TREATMENT PLAN FOR OBESITY:  Recommended Dietary Goals  Natasha Rice is currently in the action stage of change. As such, her goal is to continue weight management plan. She has agreed to the Category 3 Plan.  Behavioral Intervention  We discussed the following Behavioral Modification Strategies today: increasing lean protein intake, increasing vegetables,  increasing lower glycemic fruits, increasing fiber rich foods, avoiding skipping meals, increasing water intake, work on meal planning and preparation, and planning for success.  Additional resources provided today: NA  Recommended Physical Activity Goals  Natasha Rice has been advised to work up to 150 minutes of moderate intensity aerobic activity a week and strengthening exercises 2-3 times per week for cardiovascular health, weight loss maintenance and preservation of muscle mass.   She has agreed to Work on scheduling and tracking physical activity.   Pharmacotherapy changes for the treatment of obesity: none  ASSOCIATED CONDITIONS ADDRESSED TODAY  Vitamin D deficiency Assessment & Plan: Last vitamin D Lab Results  Component Value Date   VD25OH 47.3 09/13/2022   She is doing well her vitamin D levels, last checked in February.  She is to continue vitamin D prescription 50,000 IU once weekly.  She does not need a refill today.  Plan to recheck levels in the next 3 months postpartum.   Morbid obesity Assessment & Plan: She has done well with little weight gain during this pregnancy.  She is doing well with eating small portions including lean protein and fiber throughout the day, hydrating well with water and keeping up her walking time.  Encouraged her to continue tracking her daily steps.  Will see her back after she delivers in July 2024 to discuss a more active plan for weight reduction.   BMI 40.0-44.9, adult  Slow transit constipation Assessment & Plan: Slow transit constipation has been worsened by her pregnancy.  Her PCP has her back on Linzess.  She has used MiraLAX as needed.  She has tried increasing her water intake and does get some fruits and vegetables and daily.  Continue current plan of care.  Look for improvements in constipation postpartum.   S/P laparoscopic sleeve gastrectomy Assessment & Plan: Continue a prenatal vitamin, small meals throughout the day  including lean protein and fiber at mealtime.  She has supplemented with a protein shake once daily.  She denies any additional issues with heartburn, nausea or vomiting outside of her pregnancy.  Continue a prenatal vitamin once daily.  Follow-up postpartum. Plan to recheck vitamin levels postpartum.       She was informed of the importance of frequent follow up visits to maximize her success with intensive lifestyle modifications for her multiple health conditions.   ATTESTASTION STATEMENTS:  Reviewed by clinician on day of visit: allergies, medications, problem list, medical history, surgical history, family history, social history, and previous encounter notes pertinent to obesity diagnosis.   I have personally spent 30 minutes total time today in preparation, patient care, nutritional counseling and documentation for this visit, including the following: review of clinical lab tests; review of medical tests/procedures/services.      Natasha Brink, DO DABFM, DABOM [facility] [address] [place], [place] [address] [phone]

## 2022-11-29 ENCOUNTER — Other Ambulatory Visit (HOSPITAL_COMMUNITY): Payer: Self-pay

## 2022-12-07 DIAGNOSIS — O10913 Unspecified pre-existing hypertension complicating pregnancy, third trimester: Secondary | ICD-10-CM | POA: Diagnosis not present

## 2022-12-07 DIAGNOSIS — Z3A35 35 weeks gestation of pregnancy: Secondary | ICD-10-CM | POA: Diagnosis not present

## 2022-12-07 DIAGNOSIS — Z3689 Encounter for other specified antenatal screening: Secondary | ICD-10-CM | POA: Diagnosis not present

## 2022-12-07 DIAGNOSIS — O99213 Obesity complicating pregnancy, third trimester: Secondary | ICD-10-CM | POA: Diagnosis not present

## 2022-12-15 DIAGNOSIS — O34219 Maternal care for unspecified type scar from previous cesarean delivery: Secondary | ICD-10-CM | POA: Diagnosis not present

## 2022-12-15 DIAGNOSIS — O1092 Unspecified pre-existing hypertension complicating childbirth: Secondary | ICD-10-CM | POA: Diagnosis not present

## 2022-12-15 DIAGNOSIS — Z3A37 37 weeks gestation of pregnancy: Secondary | ICD-10-CM | POA: Diagnosis not present

## 2022-12-16 DIAGNOSIS — O1092 Unspecified pre-existing hypertension complicating childbirth: Secondary | ICD-10-CM | POA: Diagnosis not present

## 2022-12-16 DIAGNOSIS — Z3A37 37 weeks gestation of pregnancy: Secondary | ICD-10-CM | POA: Diagnosis not present

## 2022-12-16 DIAGNOSIS — O34219 Maternal care for unspecified type scar from previous cesarean delivery: Secondary | ICD-10-CM | POA: Diagnosis not present

## 2022-12-17 ENCOUNTER — Other Ambulatory Visit (HOSPITAL_BASED_OUTPATIENT_CLINIC_OR_DEPARTMENT_OTHER): Payer: Self-pay

## 2022-12-17 MED ORDER — IBUPROFEN 800 MG PO TABS
800.0000 mg | ORAL_TABLET | Freq: Three times a day (TID) | ORAL | 0 refills | Status: DC
  Filled 2022-12-17: qty 30, 10d supply, fill #0

## 2022-12-17 MED ORDER — OXYCODONE HCL 5 MG PO TABS
5.0000 mg | ORAL_TABLET | ORAL | 0 refills | Status: DC | PRN
  Filled 2022-12-17: qty 25, 5d supply, fill #0

## 2022-12-17 MED ORDER — ENOXAPARIN SODIUM 40 MG/0.4ML IJ SOSY
40.0000 mg | PREFILLED_SYRINGE | Freq: Every day | INTRAMUSCULAR | 0 refills | Status: DC
  Filled 2022-12-17: qty 0.8, 2d supply, fill #0
  Filled 2022-12-17: qty 1.2, 3d supply, fill #0

## 2022-12-20 ENCOUNTER — Other Ambulatory Visit (INDEPENDENT_AMBULATORY_CARE_PROVIDER_SITE_OTHER): Payer: Self-pay | Admitting: Family Medicine

## 2022-12-20 DIAGNOSIS — E559 Vitamin D deficiency, unspecified: Secondary | ICD-10-CM

## 2022-12-23 ENCOUNTER — Other Ambulatory Visit (INDEPENDENT_AMBULATORY_CARE_PROVIDER_SITE_OTHER): Payer: Self-pay | Admitting: Family Medicine

## 2022-12-23 DIAGNOSIS — E559 Vitamin D deficiency, unspecified: Secondary | ICD-10-CM

## 2022-12-24 ENCOUNTER — Other Ambulatory Visit (HOSPITAL_BASED_OUTPATIENT_CLINIC_OR_DEPARTMENT_OTHER): Payer: Self-pay

## 2022-12-24 ENCOUNTER — Encounter (INDEPENDENT_AMBULATORY_CARE_PROVIDER_SITE_OTHER): Payer: Self-pay

## 2022-12-24 DIAGNOSIS — O165 Unspecified maternal hypertension, complicating the puerperium: Secondary | ICD-10-CM | POA: Diagnosis not present

## 2022-12-24 MED ORDER — IBUPROFEN 800 MG PO TABS
800.0000 mg | ORAL_TABLET | Freq: Three times a day (TID) | ORAL | 11 refills | Status: DC
  Filled 2022-12-24 – 2022-12-27 (×3): qty 30, 10d supply, fill #0
  Filled 2023-01-06: qty 30, 10d supply, fill #1
  Filled 2023-01-11 – 2023-01-14 (×2): qty 30, 10d supply, fill #2
  Filled 2023-02-01: qty 30, 10d supply, fill #3
  Filled 2023-03-08: qty 30, 10d supply, fill #4
  Filled 2023-05-10: qty 30, 10d supply, fill #5
  Filled 2023-05-28: qty 30, 10d supply, fill #6
  Filled 2023-08-12: qty 30, 10d supply, fill #7
  Filled 2023-08-25 – 2023-11-02 (×2): qty 30, 10d supply, fill #0

## 2022-12-27 ENCOUNTER — Other Ambulatory Visit (HOSPITAL_BASED_OUTPATIENT_CLINIC_OR_DEPARTMENT_OTHER): Payer: Self-pay

## 2022-12-28 ENCOUNTER — Other Ambulatory Visit (HOSPITAL_COMMUNITY): Payer: Self-pay

## 2022-12-29 DIAGNOSIS — R1030 Lower abdominal pain, unspecified: Secondary | ICD-10-CM | POA: Diagnosis not present

## 2023-01-05 ENCOUNTER — Telehealth (INDEPENDENT_AMBULATORY_CARE_PROVIDER_SITE_OTHER): Payer: Self-pay | Admitting: Family Medicine

## 2023-01-05 NOTE — Telephone Encounter (Signed)
6.12 Patient request a refill for Vitamin D but pharmacy keeps denying it. Please advise. Natasha Rice

## 2023-01-06 ENCOUNTER — Encounter (INDEPENDENT_AMBULATORY_CARE_PROVIDER_SITE_OTHER): Payer: Self-pay | Admitting: Family Medicine

## 2023-01-06 ENCOUNTER — Telehealth (INDEPENDENT_AMBULATORY_CARE_PROVIDER_SITE_OTHER): Payer: 59 | Admitting: Family Medicine

## 2023-01-06 ENCOUNTER — Other Ambulatory Visit (HOSPITAL_BASED_OUTPATIENT_CLINIC_OR_DEPARTMENT_OTHER): Payer: Self-pay

## 2023-01-06 DIAGNOSIS — F3289 Other specified depressive episodes: Secondary | ICD-10-CM | POA: Diagnosis not present

## 2023-01-06 DIAGNOSIS — Z6841 Body Mass Index (BMI) 40.0 and over, adult: Secondary | ICD-10-CM | POA: Diagnosis not present

## 2023-01-06 DIAGNOSIS — E559 Vitamin D deficiency, unspecified: Secondary | ICD-10-CM

## 2023-01-06 MED ORDER — VITAMIN D (ERGOCALCIFEROL) 1.25 MG (50000 UNIT) PO CAPS
50000.0000 [IU] | ORAL_CAPSULE | ORAL | 0 refills | Status: DC
Start: 2023-01-06 — End: 2023-03-29
  Filled 2023-01-06: qty 12, 84d supply, fill #0

## 2023-01-06 NOTE — Progress Notes (Signed)
TeleHealth Visit:  This visit was completed with telemedicine (audio/video) technology. [place] has verbally consented to this TeleHealth visit. The patient is located at home, the provider is located at home. The participants in this visit include the listed provider and patient. The visit was conducted today via MyChart video.  OBESITY [place] is here to discuss her progress with her obesity treatment plan along with follow-up of her obesity related diagnoses.   Today's visit was # 4 Starting weight: 229 lbs Starting date: 09/13/22 Weight at last in office visit: 234 lbs on 11/02/22 Total weight loss: 0 lbs at last in office visit on 11/02/22. Today's reported weight (01/06/23):  218  lbs  Nutrition Plan: the Category 3 plan - 90-95% adherence.  Current exercise:  walking around apartment complex.  Interim History:  She had a C-section on May 22.  Weight on the day of delivery was 239 pounds.  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  She just started back on category 3 on May 26. She always has protein first. She is s/p gastric sleeve on 06/11/2016 at [facility] with Dr. Clent Ridges.  High weight was 279 pounds, nadir 150 pounds.  She then got pregnant with her daughter and regained weight. 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 Water intake is excellent. Hunger is satisfied.  She breaks the food up into smaller meals due to portion restriction from her gastric sleeve.  Assessment/Plan:  1. Vitamin D Deficiency Vitamin D is not at goal of 50.  Most recent vitamin D level was 47.3 on 09/13/2022.Marland Kitchen She is on  prescription ergocalciferol 50,000 IU weekly. Lab Results  Component Value Date   VD25OH 47.3 09/13/2022   VD25OH 60.49 06/17/2020    Plan: Continue and refill  prescription ergocalciferol 50,000 IU weekly   2.  Other depression Notes what she considers to be mild depression.  Has been a bit "weepy".  Feelings  are complicated due to the surrogacy situation and the fact that the baby is biologically hers. Her OB recommended that she see a counselor and provided a name for her.  She plans on setting up an appointment.  Plan: Set up appointment with counselor recommended by OB. Consider postpartum support group.  3. Morbid Obesity: Current BMI 9  [place] is currently in the action stage of change. As such, her goal is to continue with weight loss efforts.  She has agreed to the Category 3 plan and Other +200 cal-preferably protein .  Continue high water intake.  Exercise goals: Walking as tolerated.  Behavioral modification strategies: increasing lean protein intake, decreasing simple carbohydrates , and planning for success.  [place] has agreed to follow-up with our clinic in 4 weeks.  No orders of the defined types were placed in this encounter.   Medications Discontinued During This Encounter  Medication Reason   Vitamin D, Ergocalciferol, (DRISDOL) 1.25 MG (50000 UNIT) CAPS capsule Reorder     Meds ordered this encounter  Medications   Vitamin D, Ergocalciferol, (DRISDOL) 1.25 MG (50000 UNIT) CAPS capsule    Sig: Take 1 capsule (50,000 Units total) by mouth once a week.    Dispense:  12 capsule    Refill:  0    Order Specific Question:   Supervising Provider    Answer:   Glennis Brink [[id number]]      Objective:   VITALS: Per patient if applicable, see vitals. GENERAL: Alert and  in no acute distress. CARDIOPULMONARY: No increased WOB. Speaking in clear sentences.  PSYCH: Pleasant and cooperative. Speech normal rate and rhythm. Affect is appropriate. Insight and judgement are appropriate. Attention is focused, linear, and appropriate.  NEURO: Oriented as arrived to appointment on time with no prompting.   Attestation Statements:   Reviewed by clinician on day of visit: allergies, medications, problem list, medical history, surgical history, family history, social history, and  previous encounter notes.   This was prepared with the assistance of Engineer, civil (consulting).  Occasional wrong-word or sound-a-like substitutions may have occurred due to the inherent limitations of voice recognition software.

## 2023-01-10 DIAGNOSIS — O165 Unspecified maternal hypertension, complicating the puerperium: Secondary | ICD-10-CM | POA: Diagnosis not present

## 2023-01-11 ENCOUNTER — Other Ambulatory Visit (HOSPITAL_BASED_OUTPATIENT_CLINIC_OR_DEPARTMENT_OTHER): Payer: Self-pay

## 2023-01-21 ENCOUNTER — Other Ambulatory Visit (HOSPITAL_BASED_OUTPATIENT_CLINIC_OR_DEPARTMENT_OTHER): Payer: Self-pay

## 2023-01-21 MED ORDER — NORETHINDRONE 0.35 MG PO TABS
1.0000 | ORAL_TABLET | Freq: Every day | ORAL | 3 refills | Status: DC
  Filled 2023-01-21: qty 84, 84d supply, fill #0

## 2023-01-21 MED ORDER — VENLAFAXINE HCL ER 75 MG PO CP24
150.0000 mg | ORAL_CAPSULE | Freq: Every day | ORAL | 3 refills | Status: DC
  Filled 2023-01-21 (×2): qty 90, 45d supply, fill #0
  Filled 2023-05-10: qty 180, 90d supply, fill #0

## 2023-01-21 MED ORDER — VENLAFAXINE HCL ER 75 MG PO CP24
150.0000 mg | ORAL_CAPSULE | Freq: Every day | ORAL | 3 refills | Status: DC
  Filled 2023-01-21: qty 180, 90d supply, fill #0

## 2023-01-26 ENCOUNTER — Other Ambulatory Visit (HOSPITAL_BASED_OUTPATIENT_CLINIC_OR_DEPARTMENT_OTHER): Payer: Self-pay

## 2023-01-26 DIAGNOSIS — F4323 Adjustment disorder with mixed anxiety and depressed mood: Secondary | ICD-10-CM | POA: Diagnosis not present

## 2023-01-26 MED ORDER — CLONAZEPAM 0.5 MG PO TABS
0.5000 mg | ORAL_TABLET | Freq: Two times a day (BID) | ORAL | 0 refills | Status: DC
  Filled 2023-01-26: qty 20, 10d supply, fill #0

## 2023-01-29 DIAGNOSIS — F4323 Adjustment disorder with mixed anxiety and depressed mood: Secondary | ICD-10-CM | POA: Insufficient documentation

## 2023-02-01 ENCOUNTER — Ambulatory Visit (INDEPENDENT_AMBULATORY_CARE_PROVIDER_SITE_OTHER): Payer: 59 | Admitting: Family Medicine

## 2023-02-01 ENCOUNTER — Encounter (INDEPENDENT_AMBULATORY_CARE_PROVIDER_SITE_OTHER): Payer: Self-pay | Admitting: Family Medicine

## 2023-02-01 VITALS — BP 141/87 | HR 81 | Temp 98.0°F | Ht 61.0 in | Wt 218.0 lb

## 2023-02-01 DIAGNOSIS — D649 Anemia, unspecified: Secondary | ICD-10-CM

## 2023-02-01 DIAGNOSIS — Z6841 Body Mass Index (BMI) 40.0 and over, adult: Secondary | ICD-10-CM

## 2023-02-01 DIAGNOSIS — E559 Vitamin D deficiency, unspecified: Secondary | ICD-10-CM | POA: Diagnosis not present

## 2023-02-01 DIAGNOSIS — K9189 Other postprocedural complications and disorders of digestive system: Secondary | ICD-10-CM

## 2023-02-01 DIAGNOSIS — I10 Essential (primary) hypertension: Secondary | ICD-10-CM

## 2023-02-01 DIAGNOSIS — D508 Other iron deficiency anemias: Secondary | ICD-10-CM | POA: Diagnosis not present

## 2023-02-01 DIAGNOSIS — E88819 Insulin resistance, unspecified: Secondary | ICD-10-CM | POA: Diagnosis not present

## 2023-02-01 NOTE — Assessment & Plan Note (Signed)
She has a history of insulin resistance not currently on any medication.  She is craving more sweets with emotions, postpartum.  She has recently restarted regular exercise.  Encouraged regular exercise at least 30 minutes 5 days a week.  Keep junk food snacks out of the house.  Consider use of metformin.  Recheck labs today.

## 2023-02-01 NOTE — Assessment & Plan Note (Signed)
Blood pressure is mildly elevated today.  She reports compliance on  nifedipine 30 mg extended release once daily.  She denies headache or chest pain.  Will closely monitor blood pressure readings with significant weight reduction postpartum.

## 2023-02-01 NOTE — Assessment & Plan Note (Addendum)
Last vitamin D Lab Results  Component Value Date   VD25OH 47.3 09/13/2022   Currently off a vitamin D supplement.  She still maintains a prenatal vitamin once daily.  She is status post vertical sleeve gastrectomy May 2017.  Recheck vitamin D level today with a goal over 50

## 2023-02-01 NOTE — Assessment & Plan Note (Signed)
Natasha Rice required IV iron infusions January 2024 during her pregnancy.  She has not been back to see hematology.  She was quite anemic following the delivery of her surrogate pregnancy baby.  She is currently only taking a prenatal vitamin once daily.  Her energy levels remain fairly low.

## 2023-02-01 NOTE — Progress Notes (Signed)
Office: [phone]  /  Fax: [phone]  WEIGHT SUMMARY AND BIOMETRICS  No data recorded Starting Weight: 229lb   Weight Lost Since Last Visit: 16lb   Vitals Temp: 98 F (36.7 C) BP: (!) 141/87 Pulse Rate: 81 SpO2: 98 %   Body Composition  Body Fat %: 45 % Fat Mass (lbs): 98.2 lbs Muscle Mass (lbs): 114.2 lbs Total Body Water (lbs): 78.8 lbs Visceral Fat Rating : 12     HPI  Chief Complaint: OBESITY  Natasha Rice is here to discuss her progress with her obesity treatment plan. She is on the the Category 3 Plan and states she is following her eating plan approximately 50 % of the time. She states she is exercising 30-60 minutes 5 times per week.   Interval History:  Since last office visit she is down 11 lb in the past 3 weeks She is emotionally dealing with surrogacy She started working out at the gym a week ago She is better about getting in meals and not meal skipping She has had baby blues/ PTSD following the murder of her cousin and delivery of surrogate baby She is getting in lean protein with meals She has previously turned to alcohol from stressors but no longer is She is in counseling now and has a good support system  Reviewed body composition analysis with muscle gain and body fat loss  Pharmacotherapy: none  PHYSICAL EXAM:  Blood pressure (!) 141/87, pulse 81, temperature 98 F (36.7 C), height 5\' 1"  (1.549 m), weight 218 lb (98.9 kg), last menstrual period 03/31/2022, SpO2 98 %. Body mass index is 41.19 kg/m.  General: She is overweight, cooperative, alert, well developed, and in no acute distress. PSYCH: Has normal mood, affect and thought process.   Lungs: Normal breathing effort, no conversational dyspnea.   ASSESSMENT AND PLAN  TREATMENT PLAN FOR OBESITY:  Recommended Dietary Goals  Natasha Rice is currently in the action stage of change. As such, her goal is to continue weight management plan. She has agreed to the Category 3  Plan.  Behavioral Intervention  We discussed the following Behavioral Modification Strategies today: increasing lean protein intake, decreasing simple carbohydrates , increasing vegetables, increasing lower glycemic fruits, avoiding skipping meals, increasing water intake, work on meal planning and preparation, keeping healthy foods at home, work on managing stress, creating time for self-care and relaxation measures, avoiding temptations and identifying enticing environmental cues, continue to practice mindfulness when eating, and planning for success.  Additional resources provided today: NA  Recommended Physical Activity Goals  Charnika has been advised to work up to 150 minutes of moderate intensity aerobic activity a week and strengthening exercises 2-3 times per week for cardiovascular health, weight loss maintenance and preservation of muscle mass.   She has agreed to Increase the intensity, frequency or duration of strengthening exercises  and Increase the intensity, frequency or duration of aerobic exercises    Pharmacotherapy changes for the treatment of obesity: none  ASSOCIATED CONDITIONS ADDRESSED TODAY  Vitamin D deficiency Assessment & Plan: Last vitamin D Lab Results  Component Value Date   VD25OH 47.3 09/13/2022   Currently off a vitamin D supplement.  She still maintains a prenatal vitamin once daily.  She is status post vertical sleeve gastrectomy May 2017.  Recheck vitamin D level today with a goal over 50  Orders: -     VITAMIN D 25 Hydroxy (Vit-D Deficiency, Fractures)  Iron deficiency anemia after gastrectomy Assessment & Plan: Natasha Rice required IV iron infusions January  2024 during her pregnancy.  She has not been back to see hematology.  She was quite anemic following the delivery of her surrogate pregnancy baby.  She is currently only taking a prenatal vitamin once daily.  Her energy levels remain fairly low.  Orders: -     Ferritin -     Iron and TIBC -      CBC  Insulin resistance Assessment & Plan: She has a history of insulin resistance not currently on any medication.  She is craving more sweets with emotions, postpartum.  She has recently restarted regular exercise.  Encouraged regular exercise at least 30 minutes 5 days a week.  Keep junk food snacks out of the house.  Consider use of metformin.  Recheck labs today.  Orders: -     Insulin, random -     Hemoglobin A1c  Anemia, unspecified type -     Folate  Morbid obesity (HCC) with starting BMI 43  BMI 40.0-44.9, adult (HCC)  Essential hypertension Assessment & Plan: Blood pressure is mildly elevated today.  She reports compliance on  nifedipine 30 mg extended release once daily.  She denies headache or chest pain.  Will closely monitor blood pressure readings with significant weight reduction postpartum.       She was informed of the importance of frequent follow up visits to maximize her success with intensive lifestyle modifications for her multiple health conditions.   ATTESTASTION STATEMENTS:  Reviewed by clinician on day of visit: allergies, medications, problem list, medical history, surgical history, family history, social history, and previous encounter notes pertinent to obesity diagnosis.   I have personally spent 30 minutes total time today in preparation, patient care, nutritional counseling and documentation for this visit, including the following: review of clinical lab tests; review of medical tests/procedures/services.      Glennis Brink, DO DABFM, DABOM [facility] [address] [place], [place] [address] [phone]

## 2023-02-02 ENCOUNTER — Other Ambulatory Visit (HOSPITAL_COMMUNITY): Payer: Self-pay

## 2023-02-02 LAB — IRON AND TIBC
Iron Saturation: 20 % (ref 15–55)
Iron: 63 ug/dL (ref 27–159)
Total Iron Binding Capacity: 312 ug/dL (ref 250–450)
UIBC: 249 ug/dL (ref 131–425)

## 2023-02-02 LAB — CBC
Hematocrit: 42.2 % (ref 34.0–46.6)
Hemoglobin: 12.8 g/dL (ref 11.1–15.9)
MCH: 23.3 pg — ABNORMAL LOW (ref 26.6–33.0)
MCHC: 30.3 g/dL — ABNORMAL LOW (ref 31.5–35.7)
MCV: 77 fL — ABNORMAL LOW (ref 79–97)
Platelets: 256 10*3/uL (ref 150–450)
RBC: 5.5 x10E6/uL — ABNORMAL HIGH (ref 3.77–5.28)
RDW: 13.6 % (ref 11.7–15.4)
WBC: 4.9 10*3/uL (ref 3.4–10.8)

## 2023-02-02 LAB — HEMOGLOBIN A1C
Est. average glucose Bld gHb Est-mCnc: 111 mg/dL
Hgb A1c MFr Bld: 5.5 % (ref 4.8–5.6)

## 2023-02-02 LAB — FERRITIN: Ferritin: 43 ng/mL (ref 15–150)

## 2023-02-02 LAB — FOLATE: Folate: >20 ng/mL (ref >3.0)

## 2023-02-02 LAB — VITAMIN D 25 HYDROXY (VIT D DEFICIENCY, FRACTURES): Vit D, 25-Hydroxy: 66.1 ng/mL (ref 30.0–100.0)

## 2023-02-02 LAB — INSULIN, RANDOM: INSULIN: 2.5 u[IU]/mL — ABNORMAL LOW (ref 2.6–24.9)

## 2023-02-02 MED ORDER — NIFEDIPINE ER OSMOTIC RELEASE 30 MG PO TB24
30.0000 mg | ORAL_TABLET | Freq: Every day | ORAL | 2 refills | Status: DC
  Filled 2023-02-02 – 2023-02-07 (×2): qty 60, 60d supply, fill #0
  Filled 2023-08-16 – 2023-08-17 (×2): qty 60, 60d supply, fill #1

## 2023-02-07 ENCOUNTER — Other Ambulatory Visit (HOSPITAL_COMMUNITY): Payer: Self-pay

## 2023-02-07 ENCOUNTER — Other Ambulatory Visit (HOSPITAL_BASED_OUTPATIENT_CLINIC_OR_DEPARTMENT_OTHER): Payer: Self-pay

## 2023-02-11 ENCOUNTER — Encounter: Payer: Self-pay | Admitting: Family Medicine

## 2023-02-11 ENCOUNTER — Ambulatory Visit: Payer: 59 | Admitting: Family Medicine

## 2023-02-11 ENCOUNTER — Other Ambulatory Visit (HOSPITAL_BASED_OUTPATIENT_CLINIC_OR_DEPARTMENT_OTHER): Payer: Self-pay

## 2023-02-11 VITALS — BP 117/79 | HR 85 | Temp 97.6°F | Ht 61.0 in | Wt 220.2 lb

## 2023-02-11 DIAGNOSIS — J0140 Acute pansinusitis, unspecified: Secondary | ICD-10-CM | POA: Diagnosis not present

## 2023-02-11 MED ORDER — METHYLPREDNISOLONE ACETATE 40 MG/ML IJ SUSP
40.0000 mg | Freq: Once | INTRAMUSCULAR | Status: AC
Start: 2023-02-11 — End: 2023-02-11
  Administered 2023-02-11: 40 mg via INTRAMUSCULAR

## 2023-02-11 MED ORDER — CEFDINIR 300 MG PO CAPS
300.0000 mg | ORAL_CAPSULE | Freq: Two times a day (BID) | ORAL | 0 refills | Status: AC
Start: 2023-02-11 — End: 2023-02-18
  Filled 2023-02-11: qty 14, 7d supply, fill #0

## 2023-02-11 NOTE — Progress Notes (Signed)
Acute Office Visit  Subjective:     Patient ID: OCTA Natasha Rice, female    DOB: Jul 11, 1986, 37 y.o.   MRN: [id number]  CC: sinusitis   HPI Patient is in today for sinusitis.  Discussed the use of AI scribe software for clinical note transcription with the patient, who gave verbal consent to proceed.  History of Present Illness   The patient, a recent surrogate mother, presents with left-sided sinus pain and congestion, reminiscent of symptoms experienced during pregnancy. The discomfort is severe, rated at 7.5-8 out of 10, and has been unresponsive to over-the-counter treatments including Flonase, ibuprofen, and Tylenol. The patient reports that the symptoms began approximately a week ago and have progressively worsened.  In addition to the sinus symptoms, the patient also reports lingering hypertension post-pregnancy and is currently on progesterone-only birth control due to this. The patient is also experiencing postpartum depression, or "baby blues," and is currently seeing a therapist and taking Klonopin and Effexor for management.  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 The patient expresses emotional difficulty with the surrogacy process, despite being friends with the parents.           ROS All review of systems negative except what is listed in the HPI      Objective:    BP 117/79   Pulse 85   Temp 97.6 F (36.4 C)   Ht 5\' 1"  (1.549 m)   Wt 220 lb 3.2 oz (99.9 kg)   LMP 03/31/2022   SpO2 98%   Breastfeeding Unknown   BMI 41.61 kg/m    Physical Exam Vitals reviewed.  Constitutional:      Appearance: Normal appearance.  HENT:     Right Ear: Hearing, tympanic membrane, ear canal and external ear normal.     Left Ear: Ear canal and external ear normal. A middle ear effusion is present.     Nose: Congestion present.  Cardiovascular:     Rate and Rhythm: Normal rate and  regular rhythm.  Pulmonary:     Effort: Pulmonary effort is normal.     Breath sounds: Normal breath sounds.  Musculoskeletal:     Cervical back: Normal range of motion and neck supple.  Skin:    General: Skin is warm and dry.  Neurological:     General: No focal deficit present.     Mental Status: She is alert and oriented to person, place, and time. Mental status is at baseline.  Psychiatric:        Mood and Affect: Mood normal.        Behavior: Behavior normal.        Thought Content: Thought content normal.        Judgment: Judgment normal.     No results found for any visits on 02/11/23.      Assessment & Plan:   Problem List Items Addressed This Visit   None Visit Diagnoses     Acute non-recurrent pansinusitis    -  Primary   Relevant Medications   cefdinir (OMNICEF) 300 MG capsule   methylPREDNISolone acetate (DEPO-MEDROL) injection 40 mg (Completed)      Sinusitis: Severe left-sided facial pain and pressure for approximately one week. Nasal congestion with some relief from Flonase. No improvement with ibuprofen or Tylenol. -Administer Depo-Medrol injection today. -Continue Flonase. -Start Cefdinir if no improvement by Sunday 02/13/2023. -Provide list of breastfeeding-safe over-the-counter medications for symptom relief.  Meds ordered this encounter  Medications   cefdinir (OMNICEF) 300 MG capsule    Sig: Take 1 capsule (300 mg total) by mouth 2 (two) times daily for 7 days.    Dispense:  14 capsule    Refill:  0    Order Specific Question:   Supervising Provider    Answer:   Danise Edge A [[id number]]   methylPREDNISolone acetate (DEPO-MEDROL) injection 40 mg    Return if symptoms worsen or fail to improve.  Clayborne Dana, NP

## 2023-02-11 NOTE — Patient Instructions (Signed)
What medications are safe to take while breastfeeding? With your health care provider's input, consider this list of medications found to be safe during breastfeeding. Keep in mind that this isn't a full list of safe medications.  Pain relievers Acetaminophen (Tylenol, others) Ibuprofen (Advil, Motrin IB, others) Naproxen sodium (Aleve, Anaprox DS, others) -- short-term use only Antimicrobial medications Fluconazole (Diflucan) Miconazole (Monistat 3, Monistat 7, others) -- apply minimal amount Clotrimazole (Mycelex, Lotrimin AF) -- apply minimal amount Penicillins, such as amoxicillin and ampicillin Cephalosporins, such as cephalexin Antihistamines Loratadine (Claritin, Alavert, others) Fexofenadine (Allegra Allergy) Decongestants Medications containing pseudoephedrine (Sudafed, Zyrtec D) -- use with caution because pseudoephedrine can decrease milk supply Birth control Progestin-only contraceptives, also known as the minipill Combination contraceptives containing estrogen and progestin -- discuss this option with your provider Researchers don't have a final answer about whether combination contraceptives containing estrogen and progestin affect milk production. Talk to your health care provider before taking this type of birth control while breastfeeding.  Gastrointestinal medications Famotidine (Pepcid, Zantac 360) Antidepressants Paroxetine (Paxil, Brisdelle, others) Sertraline (Zoloft) Fluvoxamine (Luvox) Constipation medications Docusate (Colace, Vear Clock' Stool Softener, others)

## 2023-02-15 ENCOUNTER — Other Ambulatory Visit (HOSPITAL_BASED_OUTPATIENT_CLINIC_OR_DEPARTMENT_OTHER): Payer: Self-pay

## 2023-02-15 MED ORDER — CLONAZEPAM 0.5 MG PO TABS
0.5000 mg | ORAL_TABLET | Freq: Two times a day (BID) | ORAL | 0 refills | Status: DC
  Filled 2023-02-15: qty 20, 10d supply, fill #0

## 2023-02-16 ENCOUNTER — Telehealth: Payer: Self-pay | Admitting: Hematology and Oncology

## 2023-02-18 DIAGNOSIS — F4323 Adjustment disorder with mixed anxiety and depressed mood: Secondary | ICD-10-CM | POA: Diagnosis not present

## 2023-02-22 ENCOUNTER — Other Ambulatory Visit (HOSPITAL_BASED_OUTPATIENT_CLINIC_OR_DEPARTMENT_OTHER): Payer: Self-pay

## 2023-02-22 MED ORDER — CLONAZEPAM 0.5 MG PO TABS
0.5000 mg | ORAL_TABLET | Freq: Two times a day (BID) | ORAL | 0 refills | Status: DC | PRN
  Filled 2023-02-23 – 2023-02-24 (×2): qty 10, 5d supply, fill #0

## 2023-02-23 ENCOUNTER — Other Ambulatory Visit (HOSPITAL_BASED_OUTPATIENT_CLINIC_OR_DEPARTMENT_OTHER): Payer: Self-pay

## 2023-02-24 ENCOUNTER — Other Ambulatory Visit: Payer: Self-pay

## 2023-02-24 ENCOUNTER — Other Ambulatory Visit (HOSPITAL_BASED_OUTPATIENT_CLINIC_OR_DEPARTMENT_OTHER): Payer: Self-pay

## 2023-02-28 ENCOUNTER — Other Ambulatory Visit (HOSPITAL_BASED_OUTPATIENT_CLINIC_OR_DEPARTMENT_OTHER): Payer: Self-pay

## 2023-02-28 MED ORDER — BUSPIRONE HCL 5 MG PO TABS
5.0000 mg | ORAL_TABLET | Freq: Two times a day (BID) | ORAL | 0 refills | Status: DC
  Filled 2023-02-28: qty 60, 30d supply, fill #0

## 2023-03-08 ENCOUNTER — Other Ambulatory Visit: Payer: Self-pay

## 2023-03-08 ENCOUNTER — Other Ambulatory Visit (HOSPITAL_COMMUNITY): Payer: Self-pay

## 2023-03-08 MED ORDER — VALACYCLOVIR HCL 500 MG PO TABS
500.0000 mg | ORAL_TABLET | Freq: Two times a day (BID) | ORAL | 5 refills | Status: DC | PRN
  Filled 2023-03-08: qty 30, 15d supply, fill #0

## 2023-03-14 ENCOUNTER — Other Ambulatory Visit: Payer: 59

## 2023-03-18 ENCOUNTER — Other Ambulatory Visit (HOSPITAL_COMMUNITY): Payer: Self-pay

## 2023-03-18 DIAGNOSIS — F4323 Adjustment disorder with mixed anxiety and depressed mood: Secondary | ICD-10-CM | POA: Diagnosis not present

## 2023-03-18 MED ORDER — HYDROXYZINE PAMOATE 25 MG PO CAPS
25.0000 mg | ORAL_CAPSULE | Freq: Three times a day (TID) | ORAL | 0 refills | Status: DC
  Filled 2023-03-18: qty 30, 10d supply, fill #0

## 2023-03-21 ENCOUNTER — Ambulatory Visit: Payer: 59 | Admitting: Hematology and Oncology

## 2023-03-24 ENCOUNTER — Other Ambulatory Visit (HOSPITAL_COMMUNITY): Payer: Self-pay

## 2023-03-24 DIAGNOSIS — F4323 Adjustment disorder with mixed anxiety and depressed mood: Secondary | ICD-10-CM | POA: Diagnosis not present

## 2023-03-24 MED ORDER — CLONAZEPAM 0.5 MG PO TABS
0.5000 mg | ORAL_TABLET | Freq: Two times a day (BID) | ORAL | 0 refills | Status: DC
Start: 2023-03-24 — End: 2023-08-12
  Filled 2023-03-24: qty 10, 5d supply, fill #0

## 2023-03-24 MED ORDER — BUSPIRONE HCL 5 MG PO TABS
5.0000 mg | ORAL_TABLET | Freq: Two times a day (BID) | ORAL | 0 refills | Status: DC
  Filled 2023-03-24: qty 60, 30d supply, fill #0

## 2023-03-29 ENCOUNTER — Encounter: Payer: Self-pay | Admitting: Family Medicine

## 2023-03-29 ENCOUNTER — Other Ambulatory Visit (HOSPITAL_COMMUNITY): Payer: Self-pay

## 2023-03-29 ENCOUNTER — Ambulatory Visit (INDEPENDENT_AMBULATORY_CARE_PROVIDER_SITE_OTHER): Payer: 59 | Admitting: Family Medicine

## 2023-03-29 VITALS — BP 134/89 | HR 72 | Temp 98.0°F | Ht 61.0 in | Wt 224.0 lb

## 2023-03-29 DIAGNOSIS — E559 Vitamin D deficiency, unspecified: Secondary | ICD-10-CM | POA: Diagnosis not present

## 2023-03-29 DIAGNOSIS — Z9884 Bariatric surgery status: Secondary | ICD-10-CM

## 2023-03-29 DIAGNOSIS — F32A Depression, unspecified: Secondary | ICD-10-CM

## 2023-03-29 DIAGNOSIS — F419 Anxiety disorder, unspecified: Secondary | ICD-10-CM | POA: Diagnosis not present

## 2023-03-29 DIAGNOSIS — Z6841 Body Mass Index (BMI) 40.0 and over, adult: Secondary | ICD-10-CM

## 2023-03-29 MED ORDER — VITAMIN D (ERGOCALCIFEROL) 1.25 MG (50000 UNIT) PO CAPS
50000.0000 [IU] | ORAL_CAPSULE | ORAL | 0 refills | Status: AC
Start: 2023-03-29 — End: ?
  Filled 2023-03-29: qty 12, 84d supply, fill #0

## 2023-03-29 NOTE — Assessment & Plan Note (Signed)
Taking a PNV daily + vitamin D RX once a week Taking vitamin B12 500 mcg daily S/p 2 Iv infusions in Jan during pregnancy

## 2023-03-29 NOTE — Progress Notes (Signed)
Office: [phone]  /  Fax: [phone]  WEIGHT SUMMARY AND BIOMETRICS  Starting Date: 09/13/22  Starting Weight: 229lb   Weight Lost Since Last Visit: 0lb   Vitals Temp: 98 F (36.7 C) BP: 134/89 Pulse Rate: 72 SpO2: 100 %   Body Composition  Body Fat %: 49.7 % Fat Mass (lbs): 111.4 lbs Muscle Mass (lbs): 106.8 lbs Total Body Water (lbs): 78.4 lbs Visceral Fat Rating : 14    HPI  Chief Complaint: OBESITY  Natasha Rice is here to discuss her progress with her obesity treatment plan. She is on the the Category 3 Plan and states she is following her eating plan approximately 70-75 % of the time. She states she is exercising 0 minutes 0 times per week.   Interval History:  Since last office visit she is up 6 lb She is now seeing a therapist and working on her sleep She has been thru family death and the emotional changes since having a surrogate pregnancy She is now back to work and plan to get back into regular exercise She doesn't feel like she is craving sweets but she didn't feel like exercising and wasn't mindful of her food choices She is pumping milk for baby  Pharmacotherapy: none  PHYSICAL EXAM:  Blood pressure 134/89, pulse 72, temperature 98 F (36.7 C), height 5\' 1"  (1.549 m), weight 224 lb (101.6 kg), SpO2 100%, unknown if currently breastfeeding. Body mass index is 42.32 kg/m.  General: She is overweight, cooperative, alert, well developed, and in no acute distress. PSYCH: Has normal mood, affect and thought process.   Lungs: Normal breathing effort, no conversational dyspnea.   ASSESSMENT AND PLAN  TREATMENT PLAN FOR OBESITY:  Recommended Dietary Goals  Natasha Rice is currently in the action stage of change. As such, her goal is to continue weight management plan. She has agreed to the Category 3 Plan.  Behavioral Intervention  We discussed the following Behavioral Modification Strategies today: increasing lean protein intake, decreasing  simple carbohydrates , increasing vegetables, increasing lower glycemic fruits, increasing water intake, work on meal planning and preparation, keeping healthy foods at home, continue to practice mindfulness when eating, and planning for success.  Additional resources provided today: NA  Recommended Physical Activity Goals  Natasha Rice has been advised to work up to 150 minutes of moderate intensity aerobic activity a week and strengthening exercises 2-3 times per week for cardiovascular health, weight loss maintenance and preservation of muscle mass.   She has agreed to Think about ways to increase daily physical activity and overcoming barriers to exercise  Pharmacotherapy changes for the treatment of obesity: none  ASSOCIATED CONDITIONS ADDRESSED TODAY  Anxiety and depression Assessment & Plan: Worsened post-partum but is benefiting from seeing a therapist and has a good support system.  Her recent lack of weight loss was directly related to mood/ stress and she feels good about the improvements she is feeling lately.   Continue counseling, current medications Keep junk food snacks out of sight Use exercise for stress reduction   Vitamin D deficiency Assessment & Plan: Last vitamin D Lab Results  Component Value Date   VD25OH 66.1 02/01/2023   She ran out of RX vitamin D Energy level is improving  Resume RX vitamin D weekly and recheck level in October  Orders: -     Vitamin D (Ergocalciferol); Take 1 capsule (50,000 Units total) by mouth once a week.  Dispense: 12 capsule; Refill: 0  Morbid obesity (HCC) with starting BMI 43  BMI 40.0-44.9, adult (HCC)  S/P laparoscopic sleeve gastrectomy Assessment & Plan: Taking a PNV daily + vitamin D RX once a week Taking vitamin B12 500 mcg daily S/p 2 Iv infusions in Jan during pregnancy        She was informed of the importance of frequent follow up visits to maximize her success with intensive lifestyle modifications for  her multiple health conditions.   ATTESTASTION STATEMENTS:  Reviewed by clinician on day of visit: allergies, medications, problem list, medical history, surgical history, family history, social history, and previous encounter notes pertinent to obesity diagnosis.   I have personally spent 30 minutes total time today in preparation, patient care, nutritional counseling and documentation for this visit, including the following: review of clinical lab tests; review of medical tests/procedures/services.      Glennis Brink, DO DABFM, DABOM [facility] [address] [place], [place] [address] [phone]

## 2023-03-30 NOTE — Assessment & Plan Note (Signed)
Worsened post-partum but is benefiting from seeing a therapist and has a good support system.  Her recent lack of weight loss was directly related to mood/ stress and she feels good about the improvements she is feeling lately.   Continue counseling, current medications Keep junk food snacks out of sight Use exercise for stress reduction

## 2023-03-30 NOTE — Assessment & Plan Note (Signed)
Last vitamin D Lab Results  Component Value Date   VD25OH 66.1 02/01/2023   She ran out of RX vitamin D Energy level is improving  Resume RX vitamin D weekly and recheck level in October

## 2023-03-31 DIAGNOSIS — F4323 Adjustment disorder with mixed anxiety and depressed mood: Secondary | ICD-10-CM | POA: Diagnosis not present

## 2023-04-01 ENCOUNTER — Other Ambulatory Visit (HOSPITAL_BASED_OUTPATIENT_CLINIC_OR_DEPARTMENT_OTHER): Payer: Self-pay

## 2023-04-01 ENCOUNTER — Other Ambulatory Visit: Payer: Self-pay | Admitting: Oncology

## 2023-04-01 ENCOUNTER — Other Ambulatory Visit (HOSPITAL_COMMUNITY): Payer: Self-pay

## 2023-04-01 DIAGNOSIS — Z006 Encounter for examination for normal comparison and control in clinical research program: Secondary | ICD-10-CM

## 2023-04-01 DIAGNOSIS — Z30015 Encounter for initial prescription of vaginal ring hormonal contraceptive: Secondary | ICD-10-CM | POA: Diagnosis not present

## 2023-04-01 MED ORDER — ETONOGESTREL-ETHINYL ESTRADIOL 0.12-0.015 MG/24HR VA RING
1.0000 | VAGINAL_RING | VAGINAL | 3 refills | Status: DC
  Filled 2023-04-01: qty 3, 84d supply, fill #0
  Filled 2023-05-10 – 2023-06-08 (×4): qty 3, 84d supply, fill #1

## 2023-04-18 DIAGNOSIS — F4323 Adjustment disorder with mixed anxiety and depressed mood: Secondary | ICD-10-CM | POA: Diagnosis not present

## 2023-04-27 ENCOUNTER — Ambulatory Visit (INDEPENDENT_AMBULATORY_CARE_PROVIDER_SITE_OTHER): Payer: 59 | Admitting: Family Medicine

## 2023-04-27 ENCOUNTER — Other Ambulatory Visit (HOSPITAL_COMMUNITY): Payer: Self-pay

## 2023-04-27 ENCOUNTER — Encounter: Payer: Self-pay | Admitting: Family Medicine

## 2023-04-27 VITALS — BP 126/91 | HR 86 | Temp 98.3°F | Ht 61.0 in | Wt 223.0 lb

## 2023-04-27 DIAGNOSIS — K912 Postsurgical malabsorption, not elsewhere classified: Secondary | ICD-10-CM | POA: Diagnosis not present

## 2023-04-27 DIAGNOSIS — D508 Other iron deficiency anemias: Secondary | ICD-10-CM

## 2023-04-27 DIAGNOSIS — Z903 Acquired absence of stomach [part of]: Secondary | ICD-10-CM

## 2023-04-27 DIAGNOSIS — E559 Vitamin D deficiency, unspecified: Secondary | ICD-10-CM

## 2023-04-27 DIAGNOSIS — Z6841 Body Mass Index (BMI) 40.0 and over, adult: Secondary | ICD-10-CM

## 2023-04-27 DIAGNOSIS — E66813 Obesity, class 3: Secondary | ICD-10-CM | POA: Diagnosis not present

## 2023-04-27 DIAGNOSIS — K9189 Other postprocedural complications and disorders of digestive system: Secondary | ICD-10-CM | POA: Diagnosis not present

## 2023-04-27 MED ORDER — VITAMIN D (ERGOCALCIFEROL) 1.25 MG (50000 UNIT) PO CAPS
50000.0000 [IU] | ORAL_CAPSULE | ORAL | 0 refills | Status: DC
Start: 2023-04-27 — End: 2023-07-12
  Filled 2023-04-27 – 2023-06-27 (×2): qty 12, 84d supply, fill #0

## 2023-04-27 NOTE — Assessment & Plan Note (Signed)
Natasha Rice is almost 7 years post vertical sleeve gastrectomy with Dr. Clent Ridges. She has had postop intestinal malabsorption with issues with iron deficiency, B12 deficiency and vitamin D deficiency. She has required IV iron infusions, last done during pregnancy in January.  She has an upcoming visit with Dr. Leonides Schanz to recheck iron levels and CBC. She has been taking a calcium and vitamin D supplement, vitamin D 50,000 IU once weekly and B12 1000 mcg once daily. Energy levels have been variable, still pumping for her surrogate baby.  Recommend taking a bariatric multivitamin daily in addition to her current list of supplements.  Will update B12 and vitamin D levels today.  Keep upcoming visit with hematology to recheck iron levels.

## 2023-04-27 NOTE — Progress Notes (Signed)
Office: [phone]  /  Fax: [phone]  WEIGHT SUMMARY AND BIOMETRICS  Starting Date: 09/13/22  Starting Weight: 229lb   Weight Lost Since Last Visit: 1lb   Vitals Temp: 98.3 F (36.8 C) BP: (!) 126/91 Pulse Rate: 86 SpO2: 96 %   Body Composition  Body Fat %: 48.8 % Fat Mass (lbs): 108.8 lbs Muscle Mass (lbs): 108.4 lbs Total Body Water (lbs): 76.4 lbs Visceral Fat Rating : 14    HPI  Chief Complaint: OBESITY  Natasha Rice is here to discuss her progress with her obesity treatment plan. She is on the the Category 3 Plan and states she is following her eating plan approximately 75-80 % of the time. She states she is exercising 60 minutes 3 times per week.   Interval History:  Since last office visit she is down 1 lb This gives her a net weight loss of 11 pounds in the past 6 months She is doing well getting her meals in Sleep and mood have improved Some fatigue with hx of IDA and alpha thal (s/p VSG) Taking Rx vitamin D weekly and B12 100 mcg daily She is up 1.6 lb of muscle mass and is down 2.6 lb of body fat in the past month She plans to get back into the gym, started 2-3 x a week She has been hungrier with pumping  Pharmacotherapy: none  PHYSICAL EXAM:  Blood pressure (!) 126/91, pulse 86, temperature 98.3 F (36.8 C), height 5\' 1"  (1.549 m), weight 223 lb (101.2 kg), SpO2 96%, unknown if currently breastfeeding. Body mass index is 42.14 kg/m.  General: She is overweight, cooperative, alert, well developed, and in no acute distress. PSYCH: Has normal mood, affect and thought process.   Lungs: Normal breathing effort, no conversational dyspnea.   ASSESSMENT AND PLAN  TREATMENT PLAN FOR OBESITY:  Recommended Dietary Goals  Natasha Rice is currently in the action stage of change. As such, her goal is to continue weight management plan. She has agreed to the Category 3 Plan. - allow a high fiber carb 1/4 plate with dinner - allow 200-300 cal of  healthy snacks daily - increase water intake to 100 oz/ day  Behavioral Intervention  We discussed the following Behavioral Modification Strategies today: increasing lean protein intake, decreasing simple carbohydrates , increasing vegetables, increasing lower glycemic fruits, increasing water intake, continue to practice mindfulness when eating, and planning for success.  Additional resources provided today: NA  Recommended Physical Activity Goals  Natasha Rice has been advised to work up to 150 minutes of moderate intensity aerobic activity a week and strengthening exercises 2-3 times per week for cardiovascular health, weight loss maintenance and preservation of muscle mass.   She has agreed to Think about ways to increase daily physical activity and overcoming barriers to exercise - goal: gym workouts 2 x a week + outdoor walking 1 day/ wk  Pharmacotherapy changes for the treatment of obesity: none  ASSOCIATED CONDITIONS ADDRESSED TODAY  Intestinal malabsorption following gastrectomy -     Vitamin B12  Vitamin D deficiency -     Vitamin D (Ergocalciferol); Take 1 capsule (50,000 Units total) by mouth once a week.  Dispense: 12 capsule; Refill: 0 -     VITAMIN D 25 Hydroxy (Vit-D Deficiency, Fractures)  Morbid obesity (HCC) with starting BMI 43  Class 3 severe obesity due to excess calories with body mass index (BMI) of 40.0 to 44.9 in adult, unspecified whether serious comorbidity present (HCC)  Iron deficiency anemia after gastrectomy  Postoperative intestinal malabsorption Assessment & Plan: Natasha Rice is almost 7 years post vertical sleeve gastrectomy with Dr. Clent Ridges. She has had postop intestinal malabsorption with issues with iron deficiency, B12 deficiency and vitamin D deficiency. She has required IV iron infusions, last done during pregnancy in January.  She has an upcoming visit with Dr. Leonides Schanz to recheck iron levels and CBC. She has been taking a calcium and vitamin D  supplement, vitamin D 50,000 IU once weekly and B12 1000 mcg once daily. Energy levels have been variable, still pumping for her surrogate baby.  Recommend taking a bariatric multivitamin daily in addition to her current list of supplements.  Will update B12 and vitamin D levels today.  Keep upcoming visit with hematology to recheck iron levels.       She was informed of the importance of frequent follow up visits to maximize her success with intensive lifestyle modifications for her multiple health conditions.   ATTESTASTION STATEMENTS:  Reviewed by clinician on day of visit: allergies, medications, problem list, medical history, surgical history, family history, social history, and previous encounter notes pertinent to obesity diagnosis.   I have personally spent 30 minutes total time today in preparation, patient care, nutritional counseling and documentation for this visit, including the following: review of clinical lab tests; review of medical tests/procedures/services.      Glennis Brink, DO DABFM, DABOM [facility] [address] [place], [place] [address] [phone]

## 2023-04-28 LAB — VITAMIN D 25 HYDROXY (VIT D DEFICIENCY, FRACTURES): Vit D, 25-Hydroxy: 73.3 ng/mL (ref 30.0–100.0)

## 2023-04-28 LAB — VITAMIN B12: Vitamin B-12: 641 pg/mL (ref 232–1245)

## 2023-04-29 ENCOUNTER — Other Ambulatory Visit: Payer: Self-pay | Admitting: *Deleted

## 2023-04-29 ENCOUNTER — Inpatient Hospital Stay: Payer: 59 | Attending: Hematology and Oncology

## 2023-04-29 DIAGNOSIS — Z8249 Family history of ischemic heart disease and other diseases of the circulatory system: Secondary | ICD-10-CM | POA: Insufficient documentation

## 2023-04-29 DIAGNOSIS — Z8261 Family history of arthritis: Secondary | ICD-10-CM | POA: Diagnosis not present

## 2023-04-29 DIAGNOSIS — Z9884 Bariatric surgery status: Secondary | ICD-10-CM | POA: Insufficient documentation

## 2023-04-29 DIAGNOSIS — Z83438 Family history of other disorder of lipoprotein metabolism and other lipidemia: Secondary | ICD-10-CM | POA: Insufficient documentation

## 2023-04-29 DIAGNOSIS — Z79624 Long term (current) use of inhibitors of nucleotide synthesis: Secondary | ICD-10-CM | POA: Insufficient documentation

## 2023-04-29 DIAGNOSIS — I10 Essential (primary) hypertension: Secondary | ICD-10-CM | POA: Diagnosis not present

## 2023-04-29 DIAGNOSIS — D509 Iron deficiency anemia, unspecified: Secondary | ICD-10-CM | POA: Insufficient documentation

## 2023-04-29 DIAGNOSIS — Z818 Family history of other mental and behavioral disorders: Secondary | ICD-10-CM | POA: Insufficient documentation

## 2023-04-29 DIAGNOSIS — F419 Anxiety disorder, unspecified: Secondary | ICD-10-CM | POA: Diagnosis not present

## 2023-04-29 DIAGNOSIS — R21 Rash and other nonspecific skin eruption: Secondary | ICD-10-CM | POA: Diagnosis not present

## 2023-04-29 DIAGNOSIS — R202 Paresthesia of skin: Secondary | ICD-10-CM | POA: Insufficient documentation

## 2023-04-29 DIAGNOSIS — D563 Thalassemia minor: Secondary | ICD-10-CM | POA: Insufficient documentation

## 2023-04-29 DIAGNOSIS — Z823 Family history of stroke: Secondary | ICD-10-CM | POA: Diagnosis not present

## 2023-04-29 DIAGNOSIS — Z9089 Acquired absence of other organs: Secondary | ICD-10-CM | POA: Diagnosis not present

## 2023-04-29 DIAGNOSIS — D500 Iron deficiency anemia secondary to blood loss (chronic): Secondary | ICD-10-CM

## 2023-04-29 DIAGNOSIS — Z833 Family history of diabetes mellitus: Secondary | ICD-10-CM | POA: Insufficient documentation

## 2023-04-29 DIAGNOSIS — Z793 Long term (current) use of hormonal contraceptives: Secondary | ICD-10-CM | POA: Insufficient documentation

## 2023-04-29 DIAGNOSIS — F32A Depression, unspecified: Secondary | ICD-10-CM | POA: Diagnosis not present

## 2023-04-29 DIAGNOSIS — Z79899 Other long term (current) drug therapy: Secondary | ICD-10-CM | POA: Diagnosis not present

## 2023-04-29 DIAGNOSIS — Z8349 Family history of other endocrine, nutritional and metabolic diseases: Secondary | ICD-10-CM | POA: Diagnosis not present

## 2023-04-29 DIAGNOSIS — Z814 Family history of other substance abuse and dependence: Secondary | ICD-10-CM | POA: Insufficient documentation

## 2023-04-29 LAB — CMP (CANCER CENTER ONLY)
ALT: 11 U/L (ref 0–44)
AST: 13 U/L — ABNORMAL LOW (ref 15–41)
Albumin: 3.7 g/dL (ref 3.5–5.0)
Alkaline Phosphatase: 77 U/L (ref 38–126)
Anion gap: 6 (ref 5–15)
BUN: 26 mg/dL — ABNORMAL HIGH (ref 6–20)
CO2: 24 mmol/L (ref 22–32)
Calcium: 9 mg/dL (ref 8.9–10.3)
Chloride: 108 mmol/L (ref 98–111)
Creatinine: 0.79 mg/dL (ref 0.44–1.00)
GFR, Estimated: >60 mL/min (ref >60)
Glucose, Bld: 109 mg/dL — ABNORMAL HIGH (ref 70–99)
Potassium: 4.2 mmol/L (ref 3.5–5.1)
Sodium: 138 mmol/L (ref 135–145)
Total Bilirubin: 0.3 mg/dL (ref 0.3–1.2)
Total Protein: 6.6 g/dL (ref 6.5–8.1)

## 2023-04-29 LAB — CBC WITH DIFFERENTIAL (CANCER CENTER ONLY)
Abs Immature Granulocytes: 0.02 10*3/uL (ref 0.00–0.07)
Basophils Absolute: 0 10*3/uL (ref 0.0–0.1)
Basophils Relative: 1 %
Eosinophils Absolute: 0.1 10*3/uL (ref 0.0–0.5)
Eosinophils Relative: 1 %
HCT: 37.5 % (ref 36.0–46.0)
Hemoglobin: 11.8 g/dL — ABNORMAL LOW (ref 12.0–15.0)
Immature Granulocytes: 0 %
Lymphocytes Relative: 29 %
Lymphs Abs: 1.8 10*3/uL (ref 0.7–4.0)
MCH: 23.1 pg — ABNORMAL LOW (ref 26.0–34.0)
MCHC: 31.5 g/dL (ref 30.0–36.0)
MCV: 73.4 fL — ABNORMAL LOW (ref 80.0–100.0)
Monocytes Absolute: 0.6 10*3/uL (ref 0.1–1.0)
Monocytes Relative: 10 %
Neutro Abs: 3.6 10*3/uL (ref 1.7–7.7)
Neutrophils Relative %: 59 %
Platelet Count: 231 10*3/uL (ref 150–400)
RBC: 5.11 MIL/uL (ref 3.87–5.11)
RDW: 15.8 % — ABNORMAL HIGH (ref 11.5–15.5)
WBC Count: 6.1 10*3/uL (ref 4.0–10.5)
nRBC: 0 % (ref 0.0–0.2)

## 2023-04-29 LAB — RETIC PANEL
Immature Retic Fract: 13.2 % (ref 2.3–15.9)
RBC.: 5.04 MIL/uL (ref 3.87–5.11)
Retic Count, Absolute: 63.5 10*3/uL (ref 19.0–186.0)
Retic Ct Pct: 1.3 % (ref 0.4–3.1)
Reticulocyte Hemoglobin: 26.8 pg — ABNORMAL LOW (ref >27.9)

## 2023-04-29 LAB — IRON AND IRON BINDING CAPACITY (CC-WL,HP ONLY)
Iron: 71 ug/dL (ref 28–170)
Saturation Ratios: 19 % (ref 10.4–31.8)
TIBC: 370 ug/dL (ref 250–450)
UIBC: 299 ug/dL (ref 148–442)

## 2023-04-29 LAB — FERRITIN: Ferritin: 27 ng/mL (ref 11–307)

## 2023-05-03 ENCOUNTER — Other Ambulatory Visit (HOSPITAL_BASED_OUTPATIENT_CLINIC_OR_DEPARTMENT_OTHER): Payer: Self-pay

## 2023-05-03 ENCOUNTER — Telehealth: Payer: Self-pay | Admitting: Hematology and Oncology

## 2023-05-03 ENCOUNTER — Other Ambulatory Visit (HOSPITAL_COMMUNITY): Payer: Self-pay

## 2023-05-03 MED ORDER — BUSPIRONE HCL 5 MG PO TABS
5.0000 mg | ORAL_TABLET | Freq: Two times a day (BID) | ORAL | 0 refills | Status: DC
Start: 2023-05-03 — End: 2023-08-12
  Filled 2023-05-03 (×2): qty 60, 30d supply, fill #0
  Filled 2023-05-28: qty 60, 30d supply, fill #1
  Filled 2023-06-27: qty 60, 30d supply, fill #2

## 2023-05-06 ENCOUNTER — Inpatient Hospital Stay (HOSPITAL_BASED_OUTPATIENT_CLINIC_OR_DEPARTMENT_OTHER): Payer: 59 | Admitting: Hematology and Oncology

## 2023-05-06 ENCOUNTER — Other Ambulatory Visit: Payer: Self-pay | Admitting: Hematology and Oncology

## 2023-05-06 ENCOUNTER — Inpatient Hospital Stay: Payer: 59 | Admitting: Hematology and Oncology

## 2023-05-06 VITALS — BP 106/71 | HR 87 | Temp 98.1°F | Resp 16 | Wt 227.9 lb

## 2023-05-06 DIAGNOSIS — R21 Rash and other nonspecific skin eruption: Secondary | ICD-10-CM | POA: Diagnosis not present

## 2023-05-06 DIAGNOSIS — D563 Thalassemia minor: Secondary | ICD-10-CM | POA: Diagnosis not present

## 2023-05-06 DIAGNOSIS — D500 Iron deficiency anemia secondary to blood loss (chronic): Secondary | ICD-10-CM | POA: Diagnosis not present

## 2023-05-06 DIAGNOSIS — Z793 Long term (current) use of hormonal contraceptives: Secondary | ICD-10-CM | POA: Diagnosis not present

## 2023-05-06 DIAGNOSIS — F32A Depression, unspecified: Secondary | ICD-10-CM | POA: Diagnosis not present

## 2023-05-06 DIAGNOSIS — Z79899 Other long term (current) drug therapy: Secondary | ICD-10-CM | POA: Diagnosis not present

## 2023-05-06 DIAGNOSIS — F419 Anxiety disorder, unspecified: Secondary | ICD-10-CM | POA: Diagnosis not present

## 2023-05-06 DIAGNOSIS — R202 Paresthesia of skin: Secondary | ICD-10-CM | POA: Diagnosis not present

## 2023-05-06 DIAGNOSIS — D509 Iron deficiency anemia, unspecified: Secondary | ICD-10-CM | POA: Diagnosis not present

## 2023-05-06 DIAGNOSIS — Z79624 Long term (current) use of inhibitors of nucleotide synthesis: Secondary | ICD-10-CM | POA: Diagnosis not present

## 2023-05-06 DIAGNOSIS — I10 Essential (primary) hypertension: Secondary | ICD-10-CM | POA: Diagnosis not present

## 2023-05-06 NOTE — Progress Notes (Signed)
[facility] [phone]   [phone]  PROGRESS NOTE  Patient Care Team: Clayborne Dana, NP as PCP - General (Family Medicine) Reynold Bowen., MD (Obstetrics and Gynecology)  Hematological/Oncological History # Iron Deficiency Anemia in Setting of Pregnancy   07/16/2022: establish care with Dr. Leonides Schanz. Hgb 11.1, Sat 8% with ferritin of 35.  08/02/2022 to 08/09/2022: received 2 doses of IV feraheme 510 mg  Interval History:  Natasha Rice 37 y.o. female with medical history significant for iron deficiency anemia in pregnancy who presents for a follow up visit. The patient's last visit was on 09/20/2022. In the interim since the last visit she gave birth to a healthy baby boy in June 2024  On exam today [name] reports she gave birth to a healthy baby boy who is doing well.  She reports that she is having "so-so energy".  She reports she has her good days and bad days.  She is feeling some tingling in her hands and feet and occasionally she can feel this tingling in her hands when driving.  She reports that she is not currently taking iron pills.  She reports she does have some occasional lightheadedness and dizziness as well as rare spots in her vision.  She reports that she just started back her menstrual cycles and she reports that she is not having a lot of bleeding or cramping.  She reports that she does not had any side effects from her prior IV iron infusions.  She notes that she is willing and able to proceed with iron therapy if it is necessary at this time.  She notes that she is not otherwise having any fevers, chills, sweats, diarrhea, constipation or other issues.  Her pregnancy is otherwise on track for a delivery on 6/5 or 6/6 with a C-section.  A full 10 point ROS is otherwise negative.  MEDICAL HISTORY:  Past Medical History:  Diagnosis Date   Allergy    Anemia    Anxiety    Constipation    Depression    GERD (gastroesophageal reflux  disease)    Hypertension    Polycystic ovarian disease    Seasonal allergies    Sleep apnea    Vitamin D deficiency     SURGICAL HISTORY: Past Surgical History:  Procedure Laterality Date   CESAREAN SECTION     GASTRIC BYPASS     TONSILLECTOMY      SOCIAL HISTORY: Social History   Socioeconomic History   Marital status: Single    Spouse name: Not on file   Number of children: Not on file   Years of education: Not on file   Highest education level: Not on file  Occupational History   Not on file  Tobacco Use   Smoking status: Never   Smokeless tobacco: Never  Vaping Use   Vaping status: Never Used  Substance and Sexual Activity   Alcohol use: Not Currently    Alcohol/week: 2.0 standard drinks of alcohol    Types: 2 Glasses of wine per week    Comment: Occ   Drug use: No   Sexual activity: Yes    Birth control/protection: Condom  Other Topics Concern   Not on file  Social History Narrative   Not on file   Social Determinants of Health   Financial Resource Strain: Low Risk  (07/30/2021)   Received from [facility]   Overall Financial Resource Strain (CARDIA)    Difficulty of Paying  Living Expenses: Not hard at all  Food Insecurity: Low Risk  (12/15/2022)   [facility]   Hunger Vital Sign    Worried About Running Out of Food in the Last Year: Never true    Ran Out of Food in the Last Year: Never true  Transportation Needs: Not on file (12/15/2022)  Physical Activity: Insufficiently Active (07/30/2021)   [facility] from [facility], [facility]   Exercise Vital Sign    Days of Exercise per Week: 3 days    Minutes of Exercise per Session: 20 min  Stress: Stress Concern Present (07/30/2021)   Received from [facility], [facility] - Occupational Stress Questionnaire    Feeling of Stress : Rather much  Social Connections: Unknown (11/30/2021)   Received from [facility]   Social Network    Social Network: Not on file  Intimate Partner Violence: Unknown (10/28/2021)   Received from [facility], [facility]   HITS    Physically Hurt: Not on file    Insult or Talk Down To: Not on file    Threaten Physical Harm: Not on file    Scream or Curse: Not on file    FAMILY HISTORY: Family History  Problem Relation Age of Onset   Stroke Mother    Heart disease Mother    Drug abuse Mother    Alcohol abuse Mother    Depression Mother    Bipolar disorder Mother    Alcoholism Mother    Alcoholism Father    Drug abuse Father    Diabetes Father    Hypertension Father    Hyperlipidemia Father    Schizophrenia Father    Miscarriages / Stillbirths Sister    Hypertension Sister    Early death Sister    Intellectual disability Maternal Grandmother    Arthritis Maternal Grandmother    Hypertension Maternal Grandmother    Intellectual disability Maternal Grandfather    Hypertension Maternal Grandfather    COPD Maternal Grandfather    Alcohol abuse Maternal Grandfather    Heart attack Maternal Grandfather    Diabetes Paternal Grandmother    Arthritis Paternal Grandmother    Hypertension Paternal Grandmother    Intellectual disability Paternal Grandmother    Dementia Paternal Grandmother    Alcohol abuse Paternal Grandfather    Intellectual disability Paternal Grandfather    Congestive Heart Failure Son     ALLERGIES:  has No Known Allergies.  MEDICATIONS:  Current Outpatient Medications  Medication Sig Dispense Refill   busPIRone (BUSPAR) 5 MG tablet Take 1 tablet (5 mg total) by mouth 2 (two) times daily. 180 tablet 0   clonazePAM (KLONOPIN) 0.5 MG tablet Take 1 tablet (0.5 mg total) by mouth 2 (two) times daily. 20 tablet 0   clonazePAM (KLONOPIN) 0.5 MG tablet Take 1 tablet (0.5 mg total) by mouth 2 (two) times daily as needed for anxiety. 10 tablet 0   etonogestrel-ethinyl estradiol (NUVARING) 0.12-0.015 MG/24HR vaginal ring Place 1  ring vaginally as directed 3 each 3   hydrOXYzine (VISTARIL) 25 MG capsule Take 1 capsule (25 mg total) by mouth 3 (three) times a day as needed for anxiety (sleep) for up to 10 days. 30 capsule 0   ibuprofen (ADVIL) 800 MG tablet Take 1 tablet (800 mg total) by mouth every 8 (eight) hours. For 10 days 30 tablet 11   linaclotide (LINZESS) 290 MCG CAPS capsule Take 1 capsule (290 mcg total)  by mouth daily. 90 capsule 3   loratadine (CLARITIN) 10 MG tablet Take by mouth.     NIFEdipine (PROCARDIA-XL/NIFEDICAL-XL) 30 MG 24 hr tablet Take 1 tablet (30 mg total) by mouth daily. 60 tablet 2   Prenatal Vit-Fe Fumarate-FA (MULTIVITAMIN-PRENATAL) 27-0.8 MG TABS tablet Take 1 tablet by mouth daily at 12 noon.     valACYclovir (VALTREX) 500 MG tablet Take 1 tablet (500 mg total) by mouth 2 (two) times daily for 3 days as needed. 30 tablet 5   venlafaxine XR (EFFEXOR-XR) 75 MG 24 hr capsule Take 2 capsules (150 mg total) by mouth daily. 90 capsule 3   Vitamin D, Ergocalciferol, (DRISDOL) 1.25 MG (50000 UNIT) CAPS capsule Take 1 capsule (50,000 Units total) by mouth once a week. 12 capsule 0   No current facility-administered medications for this visit.    REVIEW OF SYSTEMS:   Constitutional: ( - ) fevers, ( - )  chills , ( - ) night sweats Eyes: ( - ) blurriness of vision, ( - ) double vision, ( - ) watery eyes Ears, nose, mouth, throat, and face: ( - ) mucositis, ( - ) sore throat Respiratory: ( - ) cough, ( - ) dyspnea, ( - ) wheezes Cardiovascular: ( - ) palpitation, ( - ) chest discomfort, ( - ) lower extremity swelling Gastrointestinal:  ( - ) nausea, ( - ) heartburn, ( - ) change in bowel habits Skin: ( - ) abnormal skin rashes Lymphatics: ( - ) new lymphadenopathy, ( - ) easy bruising Neurological: ( - ) numbness, ( - ) tingling, ( - ) new weaknesses Behavioral/Psych: ( - ) mood change, ( - ) new changes  All other systems were reviewed with the patient and are negative.  PHYSICAL  EXAMINATION:  Vitals:   05/06/23 1339  BP: 106/71  Pulse: 87  Resp: 16  Temp: 98.1 F (36.7 C)  SpO2: 97%    Filed Weights   05/06/23 1339  Weight: 227 lb 14.4 oz (103.4 kg)     GENERAL: well appearing young [place] female, alert, no distress and comfortable SKIN: skin color, texture, turgor are normal, no rashes or significant lesions EYES: conjunctiva are pink and non-injected, sclera clear LUNGS: clear to auscultation and percussion with normal breathing effort HEART: regular rate & rhythm and no murmurs and no lower extremity edema Musculoskeletal: no cyanosis of digits and no clubbing  ABDOMEN: pregnant PSYCH: alert & oriented x 3, fluent speech NEURO: no focal motor/sensory deficits  LABORATORY DATA:  I have reviewed the data as listed    Latest Ref Rng & Units 04/29/2023    3:19 PM 02/01/2023    8:34 AM 09/13/2022    2:54 PM  CBC  WBC 4.0 - 10.5 K/uL 6.1  4.9  7.9   Hemoglobin 12.0 - 15.0 g/dL 16.1  09.6  04.5   Hematocrit 36.0 - 46.0 % 37.5  42.2  36.0   Platelets 150 - 400 K/uL 231  256  220        Latest Ref Rng & Units 04/29/2023    3:19 PM 09/13/2022    2:54 PM 07/16/2022    2:40 PM  CMP  Glucose 70 - 99 mg/dL 409  96  811   BUN 6 - 20 mg/dL 26  7  8    Creatinine 0.44 - 1.00 mg/dL 9.14  7.82  9.56   Sodium 135 - 145 mmol/L 138  135  136   Potassium 3.5 - 5.1  mmol/L 4.2  4.0  3.9   Chloride 98 - 111 mmol/L 108  103  104   CO2 22 - 32 mmol/L 24  25  27    Calcium 8.9 - 10.3 mg/dL 9.0  8.7  9.0   Total Protein 6.5 - 8.1 g/dL 6.6  6.4  6.7   Total Bilirubin 0.3 - 1.2 mg/dL 0.3  0.3  0.3   Alkaline Phos 38 - 126 U/L 77  60  66   AST 15 - 41 U/L 13  21  13    ALT 0 - 44 U/L 11  28  12     RADIOGRAPHIC STUDIES: No results found.  ASSESSMENT & PLAN Natasha Rice 37 y.o. female with medical history significant for iron deficiency anemia in pregnancy who presents for a follow up visit.  # Iron Deficiency Anemia in Setting of Pregnancy   #  Alpha Thalassemia Minor  -- Findings are consistent with iron deficiency anemia secondary to patient's menorrhagia --Encouraged her to follow-up with OB/GYN for better control of her menstrual cycles following her delivery. -- Patient unable to tolerate ferrous sulfate --Patient gave birth to a healthy baby boy in June 2024. --patient received 2 doses of IV Feraheme with normalization of her iron levels, but she had persistently low Hgb and MCV.  Found to have alpha thalassemia trait. PLAN: --labs today show white blood cell 6.1, hemoglobin 1.8, MCV 73.4, and platelets of 231 --Will proceed with IV iron therapy.  Will try for Monoferric.  Patient previously received Feraheme --Plan for return to clinic in 3 months time with labs 1 week before  No orders of the defined types were placed in this encounter.  All questions were answered. The patient knows to call the clinic with any problems, questions or concerns.  A total of more than 30 minutes were spent on this encounter with face-to-face time and non-face-to-face time, including preparing to see the patient, ordering tests and/or medications, counseling the patient and coordination of care as outlined above.   Ulysees Barns, MD Department of Hematology/Oncology [facility] Phone: [phone] Pager: [phone] Email: [email or web address]  05/09/2023 6:45 PM

## 2023-05-09 ENCOUNTER — Encounter: Payer: Self-pay | Admitting: Hematology and Oncology

## 2023-05-11 ENCOUNTER — Other Ambulatory Visit: Payer: Self-pay

## 2023-05-11 ENCOUNTER — Other Ambulatory Visit (HOSPITAL_COMMUNITY): Payer: Self-pay

## 2023-05-11 ENCOUNTER — Encounter: Payer: Self-pay | Admitting: Hematology and Oncology

## 2023-05-12 DIAGNOSIS — F4323 Adjustment disorder with mixed anxiety and depressed mood: Secondary | ICD-10-CM | POA: Diagnosis not present

## 2023-05-16 ENCOUNTER — Telehealth: Payer: Self-pay

## 2023-05-16 ENCOUNTER — Other Ambulatory Visit: Payer: Self-pay

## 2023-05-16 NOTE — Telephone Encounter (Signed)
Hello,  Patient will be scheduled as soon as possible.   Auth Submission: NO AUTH NEEDED Site of care: Site of care: [facility] Payer: [employer] Medication & CPT/J Code(s) submitted: Venofer (Iron Sucrose) J1756 Route of submission (phone, fax, portal): portal Phone # Fax # Auth type: Buy/Bill PB Units/visits requested: 200mg , 5 doses Reference number:  Approval from: 05/16/23 to 07/16/23

## 2023-05-16 NOTE — Telephone Encounter (Signed)
Hello,  Auth Submission: DENIED Site of care: Site of care: [facility] Payer: [employer] Medication & CPT/J Code(s) submitted: Monoferric (Ferrci derisomaltose) [phone] Route of submission (phone, fax, portal): fax Phone # Fax # Auth type: Buy/Bill PB Units/visits requested: 1 visit, 1000mg  Reference number:     Authorization has been DENIED because preferred medication is Venofer. Would you like to switch?

## 2023-05-19 ENCOUNTER — Other Ambulatory Visit (HOSPITAL_COMMUNITY): Payer: Self-pay

## 2023-05-19 ENCOUNTER — Telehealth: Payer: 59 | Admitting: Physician Assistant

## 2023-05-19 DIAGNOSIS — L03313 Cellulitis of chest wall: Secondary | ICD-10-CM

## 2023-05-19 MED ORDER — CEPHALEXIN 500 MG PO CAPS
500.0000 mg | ORAL_CAPSULE | Freq: Four times a day (QID) | ORAL | 0 refills | Status: AC
  Filled 2023-05-19: qty 20, 5d supply, fill #0

## 2023-05-19 NOTE — Progress Notes (Signed)
E Visit for Cellulitis  We are sorry that you are not feeling well. Here is how we plan to help!  Based on what you shared with me it looks like you have cellulitis.  Cellulitis looks like areas of skin redness, swelling, and warmth; it develops as a result of bacteria entering under the skin. Little red spots and/or bleeding can be seen in skin, and tiny surface sacs containing fluid can occur. Fever can be present. Cellulitis is almost always on one side of a body, and the lower limbs are the most common site of involvement.   I have prescribed:  Keflex 500mg take one by mouth four times a day for 5 days  HOME CARE:  Take your medications as ordered and take all of them, even if the skin irritation appears to be healing.   GET HELP RIGHT AWAY IF:  Symptoms that don't begin to go away within 48 hours. Severe redness persists or worsens If the area turns color, spreads or swells. If it blisters and opens, develops yellow-brown crust or bleeds. You develop a fever or chills. If the pain increases or becomes unbearable.  Are unable to keep fluids and food down.  MAKE SURE YOU   Understand these instructions. Will watch your condition. Will get help right away if you are not doing well or get worse.  Thank you for choosing an e-visit.  Your e-visit answers were reviewed by a board certified advanced clinical practitioner to complete your personal care plan. Depending upon the condition, your plan could have included both over the counter or prescription medications.  Please review your pharmacy choice. Make sure the pharmacy is open so you can pick up prescription now. If there is a problem, you may contact your provider through MyChart messaging and have the prescription routed to another pharmacy.  Your safety is important to us. If you have drug allergies check your prescription carefully.   For the next 24 hours you can use MyChart to ask questions about today's visit, request a  non-urgent call back, or ask for a work or school excuse. You will get an email in the next two days asking about your experience. I hope that your e-visit has been valuable and will speed your recovery.  

## 2023-05-19 NOTE — Progress Notes (Signed)
I have spent 5 minutes in review of e-visit questionnaire, review and updating patient chart, medical decision making and response to patient.   Mia Milan Cody Jacklynn Dehaas, PA-C    

## 2023-05-24 ENCOUNTER — Ambulatory Visit (INDEPENDENT_AMBULATORY_CARE_PROVIDER_SITE_OTHER): Payer: 59

## 2023-05-24 VITALS — BP 126/77 | HR 89 | Temp 98.1°F | Resp 20 | Ht 61.5 in | Wt 230.6 lb

## 2023-05-24 DIAGNOSIS — K9189 Other postprocedural complications and disorders of digestive system: Secondary | ICD-10-CM | POA: Diagnosis not present

## 2023-05-24 DIAGNOSIS — D508 Other iron deficiency anemias: Secondary | ICD-10-CM

## 2023-05-24 MED ORDER — ACETAMINOPHEN 325 MG PO TABS
650.0000 mg | ORAL_TABLET | Freq: Once | ORAL | Status: AC
Start: 2023-05-24 — End: 2023-05-24
  Administered 2023-05-24: 650 mg via ORAL
  Filled 2023-05-24: qty 2

## 2023-05-24 MED ORDER — DIPHENHYDRAMINE HCL 25 MG PO CAPS
25.0000 mg | ORAL_CAPSULE | Freq: Once | ORAL | Status: AC
  Administered 2023-05-24: 25 mg via ORAL
  Filled 2023-05-24: qty 1

## 2023-05-24 MED ORDER — IRON SUCROSE 20 MG/ML IV SOLN
200.0000 mg | Freq: Once | INTRAVENOUS | Status: AC
  Administered 2023-05-24: 200 mg via INTRAVENOUS
  Filled 2023-05-24: qty 10

## 2023-05-24 NOTE — Progress Notes (Signed)
Diagnosis: Iron Deficiency Anemia  Provider:  Chilton Greathouse MD  Procedure: IV Push  IV Type: Peripheral, IV Location: R Antecubital  Venofer (Iron Sucrose), Dose: 200 mg  Post Infusion IV Care: Observation period completed and Peripheral IV Discontinued  Discharge: Condition: Good, Destination: Home . AVS Declined  Performed by:  Rico Ala, LPN

## 2023-05-25 ENCOUNTER — Ambulatory Visit: Payer: 59 | Admitting: Family Medicine

## 2023-05-28 ENCOUNTER — Other Ambulatory Visit (HOSPITAL_COMMUNITY): Payer: Self-pay

## 2023-05-30 ENCOUNTER — Other Ambulatory Visit: Payer: Self-pay

## 2023-05-30 ENCOUNTER — Other Ambulatory Visit (HOSPITAL_COMMUNITY): Payer: 59

## 2023-05-30 ENCOUNTER — Other Ambulatory Visit (HOSPITAL_COMMUNITY): Payer: Self-pay

## 2023-05-31 ENCOUNTER — Telehealth: Payer: 59 | Admitting: Physician Assistant

## 2023-05-31 ENCOUNTER — Ambulatory Visit: Payer: 59

## 2023-05-31 ENCOUNTER — Other Ambulatory Visit (HOSPITAL_COMMUNITY): Payer: Self-pay

## 2023-05-31 VITALS — BP 125/83 | HR 76 | Temp 97.9°F | Resp 18 | Ht 61.5 in | Wt 230.6 lb

## 2023-05-31 DIAGNOSIS — K9189 Other postprocedural complications and disorders of digestive system: Secondary | ICD-10-CM

## 2023-05-31 DIAGNOSIS — D508 Other iron deficiency anemias: Secondary | ICD-10-CM | POA: Diagnosis not present

## 2023-05-31 DIAGNOSIS — F4323 Adjustment disorder with mixed anxiety and depressed mood: Secondary | ICD-10-CM | POA: Diagnosis not present

## 2023-05-31 DIAGNOSIS — J069 Acute upper respiratory infection, unspecified: Secondary | ICD-10-CM

## 2023-05-31 MED ORDER — IRON SUCROSE 20 MG/ML IV SOLN
200.0000 mg | Freq: Once | INTRAVENOUS | Status: AC
  Administered 2023-05-31: 200 mg via INTRAVENOUS
  Filled 2023-05-31: qty 10

## 2023-05-31 MED ORDER — PROMETHAZINE-DM 6.25-15 MG/5ML PO SYRP
5.0000 mL | ORAL_SOLUTION | Freq: Four times a day (QID) | ORAL | 0 refills | Status: DC | PRN
  Filled 2023-05-31: qty 118, 6d supply, fill #0

## 2023-05-31 MED ORDER — CLONAZEPAM 0.5 MG PO TABS
0.5000 mg | ORAL_TABLET | Freq: Two times a day (BID) | ORAL | 0 refills | Status: DC
  Filled 2023-05-31: qty 20, 10d supply, fill #0

## 2023-05-31 MED ORDER — FLUTICASONE PROPIONATE 50 MCG/ACT NA SUSP
2.0000 | Freq: Every day | NASAL | 0 refills | Status: DC
  Filled 2023-05-31: qty 16, 30d supply, fill #0

## 2023-05-31 MED ORDER — DIPHENHYDRAMINE HCL 25 MG PO CAPS: 25.0000 mg | ORAL_CAPSULE | Freq: Once | ORAL | Status: DC

## 2023-05-31 MED ORDER — HYDROXYZINE PAMOATE 25 MG PO CAPS
25.0000 mg | ORAL_CAPSULE | Freq: Three times a day (TID) | ORAL | 0 refills | Status: AC | PRN
  Filled 2023-05-31: qty 30, 10d supply, fill #0

## 2023-05-31 MED ORDER — ACETAMINOPHEN 325 MG PO TABS
650.0000 mg | ORAL_TABLET | Freq: Once | ORAL | Status: DC
Start: 2023-05-31 — End: 2023-05-31

## 2023-05-31 NOTE — Progress Notes (Signed)
Diagnosis: Iron Deficiency Anemia  Provider:  Chilton Greathouse MD  Procedure: IV Push  IV Type: Peripheral, IV Location: L Hand  Venofer (Iron Sucrose), Dose: 200 mg  Post Infusion IV Care: Patient declined observation and Peripheral IV Discontinued  Discharge: Condition: Good, Destination: Home . AVS Declined  Performed by:  Loney Hering, LPN

## 2023-05-31 NOTE — Progress Notes (Signed)
E-Visit for Upper Respiratory Infection   We are sorry you are not feeling well.  Here is how we plan to help!  Based on what you have shared with me, it looks like you may have a viral upper respiratory infection.  Upper respiratory infections are caused by a large number of viruses; however, rhinovirus is the most common cause.   Symptoms vary from person to person, with common symptoms including sore throat, cough, fatigue or lack of energy and feeling of general discomfort.  A low-grade fever of up to 100.4 may present, but is often uncommon.  Symptoms vary however, and are closely related to a person's age or underlying illnesses.  The most common symptoms associated with an upper respiratory infection are nasal discharge or congestion, cough, sneezing, headache and pressure in the ears and face.  These symptoms usually persist for about 3 to 10 days, but can last up to 2 weeks.  It is important to know that upper respiratory infections do not cause serious illness or complications in most cases.    Upper respiratory infections can be transmitted from person to person, with the most common method of transmission being a person's hands.  The virus is able to live on the skin and can infect other persons for up to 2 hours after direct contact.  Also, these can be transmitted when someone coughs or sneezes; thus, it is important to cover the mouth to reduce this risk.  To keep the spread of the illness at bay, good hand hygiene is very important.  This is an infection that is most likely caused by a virus. There are no specific treatments other than to help you with the symptoms until the infection runs its course.  We are sorry you are not feeling well.  Here is how we plan to help!   For nasal congestion, you may use an oral decongestants such as Mucinex D or if you have glaucoma or high blood pressure use plain Mucinex.  Saline nasal spray or nasal drops can help and can safely be used as often as  needed for congestion.  For your congestion, I have prescribed Fluticasone nasal spray one spray in each nostril twice a day  If you do not have a history of heart disease, hypertension, diabetes or thyroid disease, prostate/bladder issues or glaucoma, you may also use Sudafed to treat nasal congestion.  It is highly recommended that you consult with a pharmacist or your primary care physician to ensure this medication is safe for you to take.     If you have a cough, you may use cough suppressants such as Delsym and Robitussin.  If you have glaucoma or high blood pressure, you can also use Coricidin HBP.   For cough I have prescribed for you Promethazine DM cough syrup Take 5mL every 6 hours as needed for cough  If you have a sore or scratchy throat, use a saltwater gargle-  to  teaspoon of salt dissolved in a 4-ounce to 8-ounce glass of warm water.  Gargle the solution for approximately 15-30 seconds and then spit.  It is important not to swallow the solution.  You can also use throat lozenges/cough drops and Chloraseptic spray to help with throat pain or discomfort.  Warm or cold liquids can also be helpful in relieving throat pain.  For headache, pain or general discomfort, you can use Ibuprofen or Tylenol as directed.   Some authorities believe that zinc sprays or the use of Echinacea   may shorten the course of your symptoms.   HOME CARE Only take medications as instructed by your medical team. Be sure to drink plenty of fluids. Water is fine as well as fruit juices, sodas and electrolyte beverages. You may want to stay away from caffeine or alcohol. If you are nauseated, try taking small sips of liquids. How do you know if you are getting enough fluid? Your urine should be a pale yellow or almost colorless. Get rest. Taking a steamy shower or using a humidifier may help nasal congestion and ease sore throat pain. You can place a towel over your head and breathe in the steam from hot water  coming from a faucet. Using a saline nasal spray works much the same way. Cough drops, hard candies and sore throat lozenges may ease your cough. Avoid close contacts especially the very young and the elderly Cover your mouth if you cough or sneeze Always remember to wash your hands.   GET HELP RIGHT AWAY IF: You develop worsening fever. If your symptoms do not improve within 10 days You develop yellow or green discharge from your nose over 3 days. You have coughing fits You develop a severe head ache or visual changes. You develop shortness of breath, difficulty breathing or start having chest pain Your symptoms persist after you have completed your treatment plan  MAKE SURE YOU  Understand these instructions. Will watch your condition. Will get help right away if you are not doing well or get worse.  Thank you for choosing an e-visit.  Your e-visit answers were reviewed by a board certified advanced clinical practitioner to complete your personal care plan. Depending upon the condition, your plan could have included both over the counter or prescription medications.  Please review your pharmacy choice. Make sure the pharmacy is open so you can pick up prescription now. If there is a problem, you may contact your provider through MyChart messaging and have the prescription routed to another pharmacy.  Your safety is important to us. If you have drug allergies check your prescription carefully.   For the next 24 hours you can use MyChart to ask questions about today's visit, request a non-urgent call back, or ask for a work or school excuse. You will get an email in the next two days asking about your experience. I hope that your e-visit has been valuable and will speed your recovery.  I have spent 5 minutes in review of e-visit questionnaire, review and updating patient chart, medical decision making and response to patient.   Shayla Heming M Calen Geister, PA-C  

## 2023-06-01 ENCOUNTER — Ambulatory Visit: Payer: 59 | Admitting: Family

## 2023-06-01 ENCOUNTER — Other Ambulatory Visit (HOSPITAL_BASED_OUTPATIENT_CLINIC_OR_DEPARTMENT_OTHER): Payer: Self-pay

## 2023-06-01 VITALS — BP 132/82 | HR 82 | Temp 99.0°F | Resp 16 | Ht 61.5 in | Wt 231.0 lb

## 2023-06-01 DIAGNOSIS — J020 Streptococcal pharyngitis: Secondary | ICD-10-CM

## 2023-06-01 DIAGNOSIS — H6691 Otitis media, unspecified, right ear: Secondary | ICD-10-CM

## 2023-06-01 LAB — POC COVID19 BINAXNOW: SARS Coronavirus 2 Ag: NEGATIVE

## 2023-06-01 LAB — POCT RAPID STREP A (OFFICE): Rapid Strep A Screen: POSITIVE — AB

## 2023-06-01 LAB — POCT INFLUENZA A/B
Influenza A, POC: NEGATIVE
Influenza B, POC: NEGATIVE

## 2023-06-01 MED ORDER — AMOXICILLIN 500 MG PO CAPS
500.0000 mg | ORAL_CAPSULE | Freq: Three times a day (TID) | ORAL | 0 refills | Status: AC
  Filled 2023-06-01: qty 30, 10d supply, fill #0

## 2023-06-01 NOTE — Progress Notes (Signed)
Subjective:     Patient ID: Natasha Rice, female    DOB: 1985/08/24, 37 y.o.   MRN: [id number]  Chief Complaint  Patient presents with   Nasal Congestion    Complains of nasal congestion that started 05/27/23. Tested negative for covid 11/04   Cough    Complains of cough since Saturday    HPI  Discussed the use of AI scribe software for clinical note transcription with the patient, who gave verbal consent to proceed.  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 Initially, she experienced a runny nose and a mild cough. She now reports ear discomfort, chest pain when coughing, and productive cough with green mucus. She also describes alternating episodes of feeling extremely cold and hot, which she experienced notably last night along with severe chest pain. She denies any radiating pain or nausea. She tested negative for COVID-19 on Monday after exposure to a positive coworker. She has been using Flonase as directed by an e-visit, but reports no improvement in symptoms. She also received an iron infusion yesterday, which she has had previously without any fever.          Health Maintenance Due  Topic Date Due   Hepatitis C Screening  Never done   INFLUENZA VACCINE  02/24/2023   COVID-19 Vaccine (4 - 2023-24 season) 03/27/2023    Past Medical History:  Diagnosis Date   Allergy    Anemia    Anxiety    Constipation    Depression    GERD (gastroesophageal reflux disease)    Hypertension    Polycystic ovarian disease    Seasonal allergies    Sleep apnea    Vitamin D deficiency     Past Surgical History:  Procedure Laterality Date   CESAREAN SECTION     GASTRIC BYPASS     TONSILLECTOMY      Family History  Problem Relation Age of Onset   Stroke Mother    Heart disease Mother    Drug abuse Mother    Alcohol abuse Mother    Depression Mother    Bipolar  disorder Mother    Alcoholism Mother    Alcoholism Father    Drug abuse Father    Diabetes Father    Hypertension Father    Hyperlipidemia Father    Schizophrenia Father    Miscarriages / Stillbirths Sister    Hypertension Sister    Early death Sister    Intellectual disability Maternal Grandmother    Arthritis Maternal Grandmother    Hypertension Maternal Grandmother    Intellectual disability Maternal Grandfather    Hypertension Maternal Grandfather    COPD Maternal Grandfather    Alcohol abuse Maternal Grandfather    Heart attack Maternal Grandfather    Diabetes Paternal Grandmother    Arthritis Paternal Grandmother    Hypertension Paternal Grandmother    Intellectual disability Paternal Grandmother    Dementia Paternal Grandmother    Alcohol abuse Paternal Grandfather    Intellectual disability Paternal Grandfather    Congestive Heart Failure Son     Social History   Socioeconomic History   Marital status: Single    Spouse name: Not on file   Number of children: Not on file   Years of education: Not on file   Highest education level: Not on file  Occupational History   Not on file  Tobacco  Use   Smoking status: Never   Smokeless tobacco: Never  Vaping Use   Vaping status: Never Used  Substance and Sexual Activity   Alcohol use: Not Currently    Alcohol/week: 2.0 standard drinks of alcohol    Types: 2 Glasses of wine per week    Comment: Occ   Drug use: No   Sexual activity: Yes    Birth control/protection: Condom  Other Topics Concern   Not on file  Social History Narrative   Not on file   Social Determinants of Health   Financial Resource Strain: Low Risk  (07/30/2021)   Received [facility], [facility]   Overall Financial Resource Strain (CARDIA)    Difficulty of Paying Living Expenses: Not hard at all  Food Insecurity: Low Risk  (12/15/2022)   [facility]   Hunger Vital Sign    Worried About Running Out of  Food in the Last Year: Never true    Ran Out of Food in the Last Year: Never true  Transportation Needs: Not on file (12/15/2022)  Physical Activity: Insufficiently Active (07/30/2021)   Received from [facility], [facility]   Exercise Vital Sign    Days of Exercise per Week: 3 days    Minutes of Exercise per Session: 20 min  Stress: Stress Concern Present (07/30/2021)   Received from [facility], [facility] - Occupational Stress Questionnaire    Feeling of Stress : Rather much  Social Connections: Unknown (11/30/2021)   Received [facility], [facility]   Social Network    Social Network: Not on file  Intimate Partner Violence: Unknown (10/28/2021)   Received from [facility], [facility]   HITS    Physically Hurt: Not on file    Insult or Talk Down To: Not on file    Threaten Physical Harm: Not on file    Scream or Curse: Not on file    Outpatient Medications Prior to Visit  Medication Sig Dispense Refill   busPIRone (BUSPAR) 5 MG tablet Take 1 tablet (5 mg total) by mouth 2 (two) times daily. 180 tablet 0   clonazePAM (KLONOPIN) 0.5 MG tablet Take 1 tablet (0.5 mg total) by mouth 2 (two) times daily as needed for anxiety. 10 tablet 0   clonazePAM (KLONOPIN) 0.5 MG tablet Take 1 tablet (0.5 mg total) by mouth 2 (two) times daily. 20 tablet 0   etonogestrel-ethinyl estradiol (NUVARING) 0.12-0.015 MG/24HR vaginal ring Place 1 ring vaginally as directed 3 each 3   fluticasone (FLONASE) 50 MCG/ACT nasal spray Place 2 sprays into both nostrils daily. 16 g 0   hydrOXYzine (VISTARIL) 25 MG capsule Take 1 capsule (25 mg total) by mouth 3 (three) times a day as needed for anxiety (sleep) for up to 10 days. 30 capsule 0   ibuprofen (ADVIL) 800 MG tablet Take 1 tablet (800 mg total) by mouth every 8 (eight) hours. For 10 days 30 tablet 11   linaclotide (LINZESS) 290 MCG CAPS capsule Take 1 capsule (290 mcg total) by mouth daily. 90  capsule 3   loratadine (CLARITIN) 10 MG tablet Take by mouth.     NIFEdipine (PROCARDIA-XL/NIFEDICAL-XL) 30 MG 24 hr tablet Take 1 tablet (30 mg total) by mouth daily. 60 tablet 2   Prenatal Vit-Fe Fumarate-FA (MULTIVITAMIN-PRENATAL) 27-0.8 MG TABS tablet Take 1 tablet by mouth daily at 12 noon.     valACYclovir (VALTREX) 500 MG tablet Take 1  tablet (500 mg total) by mouth 2 (two) times daily for 3 days as needed. 30 tablet 5   venlafaxine XR (EFFEXOR-XR) 75 MG 24 hr capsule Take 2 capsules (150 mg total) by mouth daily. 90 capsule 3   Vitamin D, Ergocalciferol, (DRISDOL) 1.25 MG (50000 UNIT) CAPS capsule Take 1 capsule (50,000 Units total) by mouth once a week. 12 capsule 0   No facility-administered medications prior to visit.    No Known Allergies  ROS    See HPI Objective:    Physical Exam Constitutional:      General: She is not in acute distress.    Appearance: Normal appearance. She is well-developed.  HENT:     Head: Normocephalic and atraumatic.     Right Ear: External ear normal. Tympanic membrane is erythematous (mild).     Left Ear: External ear normal.     Mouth/Throat:     Pharynx: Posterior oropharyngeal erythema present. No oropharyngeal exudate or uvula swelling.     Tonsils: No tonsillar exudate or tonsillar abscesses.  Eyes:     General: No scleral icterus. Neck:     Thyroid: No thyromegaly.  Cardiovascular:     Rate and Rhythm: Normal rate and regular rhythm.     Heart sounds: Normal heart sounds. No murmur heard. Pulmonary:     Effort: Pulmonary effort is normal. No respiratory distress.     Breath sounds: Normal breath sounds. No wheezing.  Musculoskeletal:     Cervical back: Neck supple.  Skin:    General: Skin is warm and dry.  Neurological:     Mental Status: She is alert and oriented to person, place, and time.  Psychiatric:        Mood and Affect: Mood normal.        Behavior: Behavior normal.        Thought Content: Thought content normal.         Judgment: Judgment normal.      BP 132/82 (BP Location: Right Arm, Patient Position: Sitting, Cuff Size: Small)   Pulse 82   Temp 99 F (37.2 C) (Oral)   Resp 16   Ht 5' 1.5" (1.562 m)   Wt 231 lb (104.8 kg)   SpO2 100%   BMI 42.94 kg/m  Wt Readings from Last 3 Encounters:  06/01/23 231 lb (104.8 kg)  05/31/23 230 lb 9.6 oz (104.6 kg)  05/24/23 230 lb 9.6 oz (104.6 kg)       Assessment & Plan:   Problem List Items Addressed This Visit       Unprioritized   Strep pharyngitis - Primary    Rapid flu and covid negative.  Step is positive.  Will rx with amoxicillin. Advised pt to throw out toothbrush in 24 hours.  Stay out of work today.  Tylenol or motrin prn pain. May also use chloraseptic spray as needed.  Reinforced importance of hydration. Pt is advised to call if symptoms worsen or symptoms are not improved in a few days.       Right otitis media    New.  Likely secondary to strep infection. Rx with amoxicillin.      Relevant Medications   amoxicillin (AMOXIL) 500 MG capsule    I am having [place] N. Meiners start on amoxicillin. I am also having her maintain her Linzess, loratadine, multivitamin-prenatal, ibuprofen, venlafaxine XR, NIFEdipine, valACYclovir, clonazePAM, etonogestrel-ethinyl estradiol, Vitamin D (Ergocalciferol), busPIRone, fluticasone, clonazePAM, and hydrOXYzine.  Meds ordered this encounter  Medications   amoxicillin (AMOXIL)  500 MG capsule    Sig: Take 1 capsule (500 mg total) by mouth 3 (three) times daily for 10 days.    Dispense:  30 capsule    Refill:  0    Order Specific Question:   Supervising Provider    Answer:   Danise Edge A [[id number]]

## 2023-06-01 NOTE — Assessment & Plan Note (Signed)
Rapid flu and covid negative.  Step is positive.  Will rx with amoxicillin. Advised pt to throw out toothbrush in 24 hours.  Stay out of work today.  Tylenol or motrin prn pain. May also use chloraseptic spray as needed.  Reinforced importance of hydration. Pt is advised to call if symptoms worsen or symptoms are not improved in a few days.

## 2023-06-01 NOTE — Assessment & Plan Note (Signed)
New.  Likely secondary to strep infection. Rx with amoxicillin.

## 2023-06-01 NOTE — Addendum Note (Signed)
Addended by: Wilford Corner on: 06/01/2023 10:16 AM   Modules accepted: Orders

## 2023-06-06 ENCOUNTER — Other Ambulatory Visit (HOSPITAL_BASED_OUTPATIENT_CLINIC_OR_DEPARTMENT_OTHER): Payer: Self-pay

## 2023-06-09 ENCOUNTER — Other Ambulatory Visit (HOSPITAL_COMMUNITY)
Admission: RE | Admit: 2023-06-09 | Discharge: 2023-06-09 | Disposition: A | Discharge status: Home / Self Care | Payer: 59 | Source: Ambulatory Visit | Attending: Oncology | Admitting: Oncology

## 2023-06-09 DIAGNOSIS — Z006 Encounter for examination for normal comparison and control in clinical research program: Secondary | ICD-10-CM | POA: Insufficient documentation

## 2023-06-10 ENCOUNTER — Ambulatory Visit (INDEPENDENT_AMBULATORY_CARE_PROVIDER_SITE_OTHER): Payer: 59

## 2023-06-10 VITALS — BP 132/84 | HR 75 | Temp 97.7°F | Resp 18 | Ht 61.5 in | Wt 230.0 lb

## 2023-06-10 DIAGNOSIS — K9189 Other postprocedural complications and disorders of digestive system: Secondary | ICD-10-CM

## 2023-06-10 DIAGNOSIS — D508 Other iron deficiency anemias: Secondary | ICD-10-CM

## 2023-06-10 MED ORDER — ACETAMINOPHEN 325 MG PO TABS
650.0000 mg | ORAL_TABLET | Freq: Once | ORAL | Status: AC
  Administered 2023-06-10: 650 mg via ORAL
  Filled 2023-06-10: qty 2

## 2023-06-10 MED ORDER — DIPHENHYDRAMINE HCL 25 MG PO CAPS
25.0000 mg | ORAL_CAPSULE | Freq: Once | ORAL | Status: AC
Start: 2023-06-10 — End: 2023-06-10
  Administered 2023-06-10: 25 mg via ORAL
  Filled 2023-06-10: qty 1

## 2023-06-10 MED ORDER — IRON SUCROSE 20 MG/ML IV SOLN
200.0000 mg | Freq: Once | INTRAVENOUS | Status: AC
  Administered 2023-06-10: 200 mg via INTRAVENOUS
  Filled 2023-06-10: qty 10

## 2023-06-10 NOTE — Progress Notes (Signed)
Diagnosis: Iron Deficiency Anemia  Provider:  Chilton Greathouse MD  Procedure: IV Push  IV Type: Peripheral, IV Location: R Antecubital  Venofer (Iron Sucrose), Dose: 200 mg  Post Infusion IV Care: Observation completed and Peripheral IV Discontinued  At the conclusion of the IVP venofer and 10ml NS flush I noticed some bruising at the site of the catheter. Patient stated the area was painful and ask that I take out IV catheter at that time. IV catheter was removed, bandage placed, and patient began observation period. Approximately 5 minutes into observation, patient called for a nurse. Band-aid was wet with what appeared to be venofer(brown fluid). Bruising had increased to approximately 3 inch circle. Bandage replaced. Heat pack applied. Monitored another 30 minutes, replaced bandage which was completely clear with no drainage. Vital signs stable. Patient states slightly tender but pain had subsided. Instructions placed on AVS for infiltration. Patient did not want AVS printed, said she would view on my chart.    Discharge: Condition: Good, Destination: Home . AVS Declined  Performed by:  Loney Hering, LPN

## 2023-06-12 ENCOUNTER — Other Ambulatory Visit: Payer: Self-pay

## 2023-06-12 ENCOUNTER — Emergency Department (HOSPITAL_BASED_OUTPATIENT_CLINIC_OR_DEPARTMENT_OTHER)
Admission: EM | Admit: 2023-06-12 | Discharge: 2023-06-12 | Disposition: A | Discharge status: Home / Self Care | Payer: 59 | Attending: Emergency Medicine | Admitting: Emergency Medicine

## 2023-06-12 ENCOUNTER — Emergency Department (HOSPITAL_BASED_OUTPATIENT_CLINIC_OR_DEPARTMENT_OTHER): Payer: 59

## 2023-06-12 ENCOUNTER — Encounter (HOSPITAL_BASED_OUTPATIENT_CLINIC_OR_DEPARTMENT_OTHER): Payer: Self-pay

## 2023-06-12 DIAGNOSIS — X58XXXA Exposure to other specified factors, initial encounter: Secondary | ICD-10-CM | POA: Diagnosis not present

## 2023-06-12 DIAGNOSIS — M79601 Pain in right arm: Secondary | ICD-10-CM | POA: Diagnosis not present

## 2023-06-12 DIAGNOSIS — T80818A Extravasation of other vesicant agent, initial encounter: Secondary | ICD-10-CM | POA: Diagnosis not present

## 2023-06-12 DIAGNOSIS — T82898A Other specified complication of vascular prosthetic devices, implants and grafts, initial encounter: Secondary | ICD-10-CM

## 2023-06-12 MED ORDER — OXYCODONE-ACETAMINOPHEN 5-325 MG PO TABS: 1.0000 | ORAL_TABLET | Freq: Four times a day (QID) | ORAL | 0 refills | Status: DC | PRN

## 2023-06-12 MED ORDER — OXYCODONE-ACETAMINOPHEN 5-325 MG PO TABS
1.0000 | ORAL_TABLET | Freq: Once | ORAL | Status: AC
  Administered 2023-06-12: 1 via ORAL
  Filled 2023-06-12: qty 1

## 2023-06-12 NOTE — ED Provider Notes (Signed)
[place] [facility] Provider Note   CSN: [id number] Arrival date & time: 06/12/23  1001     History  Chief Complaint  Patient presents with   Arm Pain    Natasha Rice is a 37 y.o. female.  She has a history of anemia and underwent iron infusion 2 days ago.  This was complicated by possible extravasation had bruising and leakage of fluid from her IV site initially.  She was instructed to use hot compresses and elevate.  Her arm is getting progressively more swollen and painful.  She said she has been doing ibuprofen 800 mg and it is not touching the pain.  No numbness or weakness.  No fevers or chills, chest pain or shortness of breath.  The history is provided by the patient.  Arm Pain This is a new problem. The current episode started 2 days ago. The problem occurs constantly. The problem has been gradually worsening. Pertinent negatives include no chest pain, no abdominal pain, no headaches and no shortness of breath. The symptoms are aggravated by bending and twisting. Nothing relieves the symptoms. She has tried a warm compress (nsaids) for the symptoms. The treatment provided no relief.       Home Medications Prior to Admission medications   Medication Sig Start Date End Date Taking? Authorizing Provider  busPIRone (BUSPAR) 5 MG tablet Take 1 tablet (5 mg total) by mouth 2 (two) times daily. 05/03/23     clonazePAM (KLONOPIN) 0.5 MG tablet Take 1 tablet (0.5 mg total) by mouth 2 (two) times daily as needed for anxiety. 03/24/23     clonazePAM (KLONOPIN) 0.5 MG tablet Take 1 tablet (0.5 mg total) by mouth 2 (two) times daily. 05/31/23     etonogestrel-ethinyl estradiol (NUVARING) 0.12-0.015 MG/24HR vaginal ring Place 1 ring vaginally as directed 04/01/23     fluticasone (FLONASE) 50 MCG/ACT nasal spray Place 2 sprays into both nostrils daily. 05/31/23   Margaretann Loveless, PA-C  ibuprofen (ADVIL) 800 MG tablet Take 1 tablet (800 mg total) by  mouth every 8 (eight) hours. For 10 days 12/24/22     linaclotide (LINZESS) 290 MCG CAPS capsule Take 1 capsule (290 mcg total) by mouth daily. 02/15/22     loratadine (CLARITIN) 10 MG tablet Take by mouth.    [provider]  NIFEdipine (PROCARDIA-XL/NIFEDICAL-XL) 30 MG 24 hr tablet Take 1 tablet (30 mg total) by mouth daily. 02/02/23     Prenatal Vit-Fe Fumarate-FA (MULTIVITAMIN-PRENATAL) 27-0.8 MG TABS tablet Take 1 tablet by mouth daily at 12 noon.    [provider]  valACYclovir (VALTREX) 500 MG tablet Take 1 tablet (500 mg total) by mouth 2 (two) times daily for 3 days as needed. 03/08/23     venlafaxine XR (EFFEXOR-XR) 75 MG 24 hr capsule Take 2 capsules (150 mg total) by mouth daily. 01/21/23     Vitamin D, Ergocalciferol, (DRISDOL) 1.25 MG (50000 UNIT) CAPS capsule Take 1 capsule (50,000 Units total) by mouth once a week. 04/27/23   Bowen, Scot Jun, DO  norethindrone (MICRONOR) 0.35 MG tablet Take 1 tablet (0.35 mg total) by mouth daily. 01/21/23 04/01/23        Allergies    Tape    Review of Systems   Review of Systems  Constitutional:  Negative for fever.  Respiratory:  Negative for shortness of breath.   Cardiovascular:  Negative for chest pain.  Gastrointestinal:  Negative for abdominal pain.  Neurological:  Negative for headaches.  Physical Exam Updated Vital Signs BP (!) 173/102 (BP Location: Left Wrist)   Pulse 75   Temp 98 F (36.7 C) (Oral)   Resp 18   LMP 05/23/2023   SpO2 100%  Physical Exam Constitutional:      Appearance: Normal appearance. She is well-developed.  HENT:     Head: Normocephalic and atraumatic.  Eyes:     Conjunctiva/sclera: Conjunctivae normal.  Cardiovascular:     Rate and Rhythm: Normal rate and regular rhythm.  Pulmonary:     Effort: Pulmonary effort is normal.     Breath sounds: Normal breath sounds.  Musculoskeletal:        General: Swelling and tenderness present.     Cervical back: Neck supple.     Comments: Right  upper extremity she is diffusely tender through her biceps and forearm area.  There is no crepitus and no skin breakdown.  Distal pulses motor and sensation intact.  Skin:    General: Skin is warm and dry.  Neurological:     General: No focal deficit present.     Mental Status: She is alert.     GCS: GCS eye subscore is 4. GCS verbal subscore is 5. GCS motor subscore is 6.     Sensory: No sensory deficit.     Motor: No weakness.     ED Results / Procedures / Treatments   Labs (all labs ordered are listed, but only abnormal results are displayed) Labs Reviewed - No data to display  EKG None  Radiology US Venous Img Upper Right (DVT Study)  Result Date: 06/12/2023 CLINICAL DATA:  Right arm pain and swelling, erythema, intravenous iron infusion 2 days ago with possible infiltration EXAM: RIGHT UPPER EXTREMITY VENOUS DOPPLER ULTRASOUND TECHNIQUE: Gray-scale sonography with graded compression, as well as color Doppler and duplex ultrasound were performed to evaluate the upper extremity deep venous system from the level of the subclavian vein and including the jugular, axillary, basilic, radial, ulnar and upper cephalic vein. Spectral Doppler was utilized to evaluate flow at rest and with distal augmentation maneuvers. COMPARISON:  None Available. FINDINGS: Contralateral Subclavian Vein: Respiratory phasicity is normal and symmetric with the symptomatic side. No evidence of thrombus. Normal compressibility. Internal Jugular Vein: No evidence of thrombus. Normal compressibility, respiratory phasicity and response to augmentation. Subclavian Vein: No evidence of thrombus. Normal compressibility, respiratory phasicity and response to augmentation. Axillary Vein: No evidence of thrombus. Normal compressibility, respiratory phasicity and response to augmentation. Cephalic Vein: No evidence of thrombus. Normal compressibility, respiratory phasicity and response to augmentation. Basilic Vein: No evidence  of thrombus. Normal compressibility, respiratory phasicity and response to augmentation. Brachial Veins: No evidence of thrombus. Normal compressibility, respiratory phasicity and response to augmentation. Radial Veins: No evidence of thrombus. Normal compressibility, respiratory phasicity and response to augmentation. Ulnar Veins: No evidence of thrombus. Normal compressibility, respiratory phasicity and response to augmentation. Other Findings:  None visualized. IMPRESSION: 1. No evidence of deep venous thrombosis within the right upper extremity. Electronically Signed   By: Sharlet Salina M.D.   On: 06/12/2023 11:20    Procedures Procedures    Medications Ordered in ED Medications  oxyCODONE-acetaminophen (PERCOCET/ROXICET) 5-325 MG per tablet 1 tablet (has no administration in time range)    ED Course/ Medical Decision Making/ A&P                                 Medical Decision Making Risk Prescription  drug management.   This patient complains of right arm pain after iron infusion; this involves an extensive number of treatment Options and is a complaint that carries with it a high risk of complications and morbidity. The differential includes phlebitis, DVT, cellulitis, allergic reaction, extravasation I ordered medication oral pain medicine and reviewed PMP when indicated. I ordered imaging studies which included venous duplex and I independently    visualized and interpreted imaging which showed no evidence of DVT Previous records obtained and reviewed in epic including note from hematology clinic regarding extravasation Cardiac monitoring reviewed, normal sinus rhythm Social determinants considered, patient with depression and increased stressors Critical Interventions: None  After the interventions stated above, I reevaluated the patient and found patient to be neurovascularly intact with no evidence of threatened limb Admission and further testing considered, no indications  for admission or further workup at this time.  Recommended continued symptomatic treatment with heat and elevation.  Close follow-up with treatment team.  Will also provide prescription for pain medication for breakthrough pain.  Return instructions discussed         Final Clinical Impression(s) / ED Diagnoses Final diagnoses:  Extravasation injury of intravenous catheter site with other complication, initial encounter [facility])    Rx / DC Orders ED Discharge Orders          Ordered    oxyCODONE-acetaminophen (PERCOCET/ROXICET) 5-325 MG tablet  Every 6 hours PRN        06/12/23 1146              Terrilee Files, MD 06/12/23 1732

## 2023-06-12 NOTE — Discharge Instructions (Addendum)
You were seen in the emergency department for right arm swelling and pain.  You had an ultrasound that did not show any evidence of a blood clot.  This is likely an inflammatory reaction due to the iron infusion.  Please continue to elevate your arm and you can do warm compresses.  Continue ibuprofen.  We are prescribing some narcotic pain medicine to use as needed for pain.  Return to the emergency department if any worsening or concerning symptoms

## 2023-06-12 NOTE — ED Triage Notes (Signed)
States got an iron infusion on Friday. States during the infusion her arm started bruising. Now having swelling, heat and pain to arm

## 2023-06-13 ENCOUNTER — Telehealth: Payer: Self-pay

## 2023-06-13 NOTE — Telephone Encounter (Signed)
Follow up with patient regarding IV infiltration on 11/15. Patient stated she was seen at [facility] yesterday and they did an ultrasound and there weren't any issues found besides the swelling. She was advised to keep an eye on the site and return if there were any more issues.  She will be coming  back to infusion clinic this Friday. Advised patient to reach out if there are any concerns before then .

## 2023-06-15 ENCOUNTER — Encounter: Payer: Self-pay | Admitting: Family Medicine

## 2023-06-15 ENCOUNTER — Other Ambulatory Visit (HOSPITAL_BASED_OUTPATIENT_CLINIC_OR_DEPARTMENT_OTHER): Payer: Self-pay

## 2023-06-15 DIAGNOSIS — M79601 Pain in right arm: Secondary | ICD-10-CM

## 2023-06-15 MED ORDER — TRAMADOL HCL 50 MG PO TABS
50.0000 mg | ORAL_TABLET | Freq: Three times a day (TID) | ORAL | 0 refills | Status: AC | PRN
  Filled 2023-06-15: qty 10, 4d supply, fill #0

## 2023-06-15 NOTE — Telephone Encounter (Signed)
ED note in chart, please advise.

## 2023-06-17 ENCOUNTER — Ambulatory Visit: Payer: 59

## 2023-06-17 VITALS — BP 144/91 | HR 73 | Temp 97.9°F | Resp 18 | Ht 61.5 in | Wt 230.0 lb

## 2023-06-17 DIAGNOSIS — D508 Other iron deficiency anemias: Secondary | ICD-10-CM | POA: Diagnosis not present

## 2023-06-17 DIAGNOSIS — K9189 Other postprocedural complications and disorders of digestive system: Secondary | ICD-10-CM | POA: Diagnosis not present

## 2023-06-17 MED ORDER — DIPHENHYDRAMINE HCL 25 MG PO CAPS
25.0000 mg | ORAL_CAPSULE | Freq: Once | ORAL | Status: AC
  Administered 2023-06-17: 25 mg via ORAL
  Filled 2023-06-17: qty 1

## 2023-06-17 MED ORDER — IRON SUCROSE 200 MG IVPB - SIMPLE MED
200.0000 mg | Freq: Once | Status: AC
  Administered 2023-06-17: 200 mg via INTRAVENOUS
  Filled 2023-06-17: qty 110

## 2023-06-17 MED ORDER — ACETAMINOPHEN 325 MG PO TABS
650.0000 mg | ORAL_TABLET | Freq: Once | ORAL | Status: AC
Start: 2023-06-17 — End: 2023-06-17
  Administered 2023-06-17: 650 mg via ORAL
  Filled 2023-06-17: qty 2

## 2023-06-17 NOTE — Progress Notes (Signed)
Diagnosis: Acute Anemia  Provider:  Chilton Greathouse MD  Procedure: IV Infusion  IV Type: Peripheral, IV Location: L Hand  Venofer (Iron Sucrose), Dose: 200 mg  Infusion Start Time: 1528  Infusion Stop Time: 1549  Post Infusion IV Care: Observation period completed and Peripheral IV Discontinued Pt requested 10 min obs. Discharge: Condition: Good, Destination: Home . AVS Declined  Performed by:  Nat Math, RN

## 2023-06-17 NOTE — Patient Instructions (Signed)
Iron Sucrose Injection What is this medication? IRON SUCROSE (EYE ern SOO krose) treats low levels of iron (iron deficiency anemia) in people with kidney disease. Iron is a mineral that plays an important role in making red blood cells, which carry oxygen from your lungs to the rest of your body. This medicine may be used for other purposes; ask your health care provider or pharmacist if you have questions. COMMON BRAND NAME(S): Venofer What should I tell my care team before I take this medication? They need to know if you have any of these conditions: Anemia not caused by low iron levels Heart disease High levels of iron in the blood Kidney disease Liver disease An unusual or allergic reaction to iron, other medications, foods, dyes, or preservatives Pregnant or trying to get pregnant Breastfeeding How should I use this medication? This medication is for infusion into a vein. It is given in a hospital or clinic setting. Talk to your care team about the use of this medication in children. While this medication may be prescribed for children as young as 2 years for selected conditions, precautions do apply. Overdosage: If you think you have taken too much of this medicine contact a poison control center or emergency room at once. NOTE: This medicine is only for you. Do not share this medicine with others. What if I miss a dose? Keep appointments for follow-up doses. It is important not to miss your dose. Call your care team if you are unable to keep an appointment. What may interact with this medication? Do not take this medication with any of the following: Deferoxamine Dimercaprol Other iron products This medication may also interact with the following: Chloramphenicol Deferasirox This list may not describe all possible interactions. Give your health care provider a list of all the medicines, herbs, non-prescription drugs, or dietary supplements you use. Also tell them if you smoke,  drink alcohol, or use illegal drugs. Some items may interact with your medicine. What should I watch for while using this medication? Visit your care team regularly. Tell your care team if your symptoms do not start to get better or if they get worse. You may need blood work done while you are taking this medication. You may need to follow a special diet. Talk to your care team. Foods that contain iron include: whole grains/cereals, dried fruits, beans, or peas, leafy green vegetables, and organ meats (liver, kidney). What side effects may I notice from receiving this medication? Side effects that you should report to your care team as soon as possible: Allergic reactions--skin rash, itching, hives, swelling of the face, lips, tongue, or throat Low blood pressure--dizziness, feeling faint or lightheaded, blurry vision Shortness of breath Side effects that usually do not require medical attention (report to your care team if they continue or are bothersome): Flushing Headache Joint pain Muscle pain Nausea Pain, redness, or irritation at injection site This list may not describe all possible side effects. Call your doctor for medical advice about side effects. You may report side effects to FDA at 1-800-FDA-1088. Where should I keep my medication? This medication is given in a hospital or clinic. It will not be stored at home. NOTE: This sheet is a summary. It may not cover all possible information. If you have questions about this medicine, talk to your doctor, pharmacist, or health care provider.  2024 Elsevier/Gold Standard (2022-12-17 00:00:00)

## 2023-06-18 LAB — HELIX MOLECULAR SCREEN: Genetic Analysis Overall Interpretation: NEGATIVE

## 2023-06-18 LAB — GENECONNECT MOLECULAR SCREEN

## 2023-06-27 ENCOUNTER — Other Ambulatory Visit: Payer: Self-pay

## 2023-06-27 ENCOUNTER — Other Ambulatory Visit (HOSPITAL_COMMUNITY): Payer: Self-pay

## 2023-06-29 ENCOUNTER — Other Ambulatory Visit: Payer: Self-pay

## 2023-07-01 ENCOUNTER — Ambulatory Visit (INDEPENDENT_AMBULATORY_CARE_PROVIDER_SITE_OTHER): Payer: 59

## 2023-07-01 VITALS — BP 136/81 | HR 87 | Temp 98.0°F | Resp 18 | Ht 61.5 in | Wt 231.0 lb

## 2023-07-01 DIAGNOSIS — K9189 Other postprocedural complications and disorders of digestive system: Secondary | ICD-10-CM

## 2023-07-01 DIAGNOSIS — D508 Other iron deficiency anemias: Secondary | ICD-10-CM

## 2023-07-01 MED ORDER — IRON SUCROSE 20 MG/ML IV SOLN
200.0000 mg | Freq: Once | INTRAVENOUS | Status: AC
  Administered 2023-07-01: 200 mg via INTRAVENOUS

## 2023-07-01 MED ORDER — ACETAMINOPHEN 325 MG PO TABS
650.0000 mg | ORAL_TABLET | Freq: Once | ORAL | Status: AC
  Administered 2023-07-01: 650 mg via ORAL
  Filled 2023-07-01: qty 2

## 2023-07-01 MED ORDER — DIPHENHYDRAMINE HCL 25 MG PO CAPS
25.0000 mg | ORAL_CAPSULE | Freq: Once | ORAL | Status: AC
  Administered 2023-07-01: 25 mg via ORAL
  Filled 2023-07-01: qty 1

## 2023-07-01 NOTE — Progress Notes (Signed)
Diagnosis: Iron Deficiency Anemia  Provider:  Chilton Greathouse MD  Procedure: IV Infusion  IV Type: Peripheral, IV Location: L Hand  Venofer (Iron Sucrose), Dose: 200 mg  Infusion Start Time: 1542  Infusion Stop Time: 1600  Post Infusion IV Care: Patient declined observation  Discharge: Condition: Good, Destination: Home . AVS Declined  Performed by:  Adriana Mccallum, RN

## 2023-07-03 ENCOUNTER — Other Ambulatory Visit: Payer: Self-pay

## 2023-07-03 ENCOUNTER — Emergency Department (HOSPITAL_BASED_OUTPATIENT_CLINIC_OR_DEPARTMENT_OTHER)
Admission: EM | Admit: 2023-07-03 | Discharge: 2023-07-03 | Disposition: A | Discharge status: Home / Self Care | Payer: 59 | Attending: Emergency Medicine | Admitting: Emergency Medicine

## 2023-07-03 DIAGNOSIS — Z79899 Other long term (current) drug therapy: Secondary | ICD-10-CM | POA: Insufficient documentation

## 2023-07-03 DIAGNOSIS — I10 Essential (primary) hypertension: Secondary | ICD-10-CM | POA: Insufficient documentation

## 2023-07-03 DIAGNOSIS — H66002 Acute suppurative otitis media without spontaneous rupture of ear drum, left ear: Secondary | ICD-10-CM | POA: Diagnosis not present

## 2023-07-03 DIAGNOSIS — H66005 Acute suppurative otitis media without spontaneous rupture of ear drum, recurrent, left ear: Secondary | ICD-10-CM

## 2023-07-03 DIAGNOSIS — Z20822 Contact with and (suspected) exposure to covid-19: Secondary | ICD-10-CM | POA: Diagnosis not present

## 2023-07-03 DIAGNOSIS — R519 Headache, unspecified: Secondary | ICD-10-CM | POA: Insufficient documentation

## 2023-07-03 LAB — CBC
HCT: 38.7 % (ref 36.0–46.0)
Hemoglobin: 12.2 g/dL (ref 12.0–15.0)
MCH: 22.9 pg — ABNORMAL LOW (ref 26.0–34.0)
MCHC: 31.5 g/dL (ref 30.0–36.0)
MCV: 72.7 fL — ABNORMAL LOW (ref 80.0–100.0)
Platelets: 269 10*3/uL (ref 150–400)
RBC: 5.32 MIL/uL — ABNORMAL HIGH (ref 3.87–5.11)
RDW: 14.3 % (ref 11.5–15.5)
WBC: 9.3 10*3/uL (ref 4.0–10.5)
nRBC: 0 % (ref 0.0–0.2)

## 2023-07-03 LAB — BASIC METABOLIC PANEL
Anion gap: 7 (ref 5–15)
BUN: 13 mg/dL (ref 6–20)
CO2: 25 mmol/L (ref 22–32)
Calcium: 8.7 mg/dL — ABNORMAL LOW (ref 8.9–10.3)
Chloride: 104 mmol/L (ref 98–111)
Creatinine, Ser: 0.67 mg/dL (ref 0.44–1.00)
GFR, Estimated: >60 mL/min (ref >60)
Glucose, Bld: 93 mg/dL (ref 70–99)
Potassium: 4.4 mmol/L (ref 3.5–5.1)
Sodium: 136 mmol/L (ref 135–145)

## 2023-07-03 LAB — RESP PANEL BY RT-PCR (RSV, FLU A&B, COVID)  RVPGX2
Influenza A by PCR: NEGATIVE
Influenza B by PCR: NEGATIVE
Resp Syncytial Virus by PCR: NEGATIVE
SARS Coronavirus 2 by RT PCR: NEGATIVE

## 2023-07-03 LAB — GROUP A STREP BY PCR: Group A Strep by PCR: NOT DETECTED

## 2023-07-03 LAB — HCG, SERUM, QUALITATIVE: Preg, Serum: NEGATIVE

## 2023-07-03 MED ORDER — DIPHENHYDRAMINE HCL 50 MG/ML IJ SOLN
12.5000 mg | Freq: Once | INTRAMUSCULAR | Status: AC
  Administered 2023-07-03: 12.5 mg via INTRAVENOUS
  Filled 2023-07-03: qty 1

## 2023-07-03 MED ORDER — KETOROLAC TROMETHAMINE 15 MG/ML IJ SOLN
15.0000 mg | Freq: Once | INTRAMUSCULAR | Status: AC
  Administered 2023-07-03: 15 mg via INTRAVENOUS
  Filled 2023-07-03: qty 1

## 2023-07-03 MED ORDER — AMOXICILLIN-POT CLAVULANATE 875-125 MG PO TABS: 1.0000 | ORAL_TABLET | Freq: Two times a day (BID) | ORAL | 0 refills | Status: DC

## 2023-07-03 MED ORDER — PROCHLORPERAZINE EDISYLATE 10 MG/2ML IJ SOLN
10.0000 mg | Freq: Once | INTRAMUSCULAR | Status: AC
  Administered 2023-07-03: 10 mg via INTRAVENOUS
  Filled 2023-07-03: qty 2

## 2023-07-03 MED ORDER — DEXAMETHASONE SODIUM PHOSPHATE 10 MG/ML IJ SOLN
10.0000 mg | Freq: Once | INTRAMUSCULAR | Status: AC
  Administered 2023-07-03: 10 mg via INTRAVENOUS
  Filled 2023-07-03: qty 1

## 2023-07-03 NOTE — ED Triage Notes (Signed)
Presented today for HA, left eye and ear pain that radiated down left shoulder that started at midnight.  Pt reports having strep 2 weeks ago and completed 10 day course of antibiotics.Still complaining of congested.  Denies fever, NVD

## 2023-07-03 NOTE — ED Provider Notes (Signed)
[place] [facility] Provider Note   CSN: [id number] Arrival date & time: 07/03/23  5102     History  Chief Complaint  Patient presents with   Migraine    Natasha Rice is a 37 y.o. female.  Patient with history of morbid obesity, hypertension presents today with complaints of headache, left eye pain, cough, and congestion. She states that same began gradually throughout the day yesterday and has been persistent since. States that she was seen and treated for strep 1 month ago with amoxicillin and had complete resolution of symptoms. States that her headache feels like pressure on the left side of her head.  Endorses associated photophobia.  Denies neck stiffness, fevers, chills, vision changes, weakness, numbness/tingling, nausea or vomiting. Denies any sick contacts. No difficulty swallowing, chest pain, or shortness of breath. Notes left sided otalgia, is able to hear without issue.   The history is provided by the patient. No language interpreter was used.  Migraine Associated symptoms include headaches.       Home Medications Prior to Admission medications   Medication Sig Start Date End Date Taking? Authorizing Provider  busPIRone (BUSPAR) 5 MG tablet Take 1 tablet (5 mg total) by mouth 2 (two) times daily. 05/03/23     clonazePAM (KLONOPIN) 0.5 MG tablet Take 1 tablet (0.5 mg total) by mouth 2 (two) times daily as needed for anxiety. 03/24/23     clonazePAM (KLONOPIN) 0.5 MG tablet Take 1 tablet (0.5 mg total) by mouth 2 (two) times daily. 05/31/23     etonogestrel-ethinyl estradiol (NUVARING) 0.12-0.015 MG/24HR vaginal ring Place 1 ring vaginally as directed 04/01/23     fluticasone (FLONASE) 50 MCG/ACT nasal spray Place 2 sprays into both nostrils daily. 05/31/23   Margaretann Loveless, PA-C  ibuprofen (ADVIL) 800 MG tablet Take 1 tablet (800 mg total) by mouth every 8 (eight) hours. For 10 days 12/24/22     linaclotide (LINZESS) 290 MCG  CAPS capsule Take 1 capsule (290 mcg total) by mouth daily. 02/15/22     loratadine (CLARITIN) 10 MG tablet Take by mouth.    [provider]  NIFEdipine (PROCARDIA-XL/NIFEDICAL-XL) 30 MG 24 hr tablet Take 1 tablet (30 mg total) by mouth daily. 02/02/23     Prenatal Vit-Fe Fumarate-FA (MULTIVITAMIN-PRENATAL) 27-0.8 MG TABS tablet Take 1 tablet by mouth daily at 12 noon.    [provider]  valACYclovir (VALTREX) 500 MG tablet Take 1 tablet (500 mg total) by mouth 2 (two) times daily for 3 days as needed. 03/08/23     venlafaxine XR (EFFEXOR-XR) 75 MG 24 hr capsule Take 2 capsules (150 mg total) by mouth daily. 01/21/23     Vitamin D, Ergocalciferol, (DRISDOL) 1.25 MG (50000 UNIT) CAPS capsule Take 1 capsule (50,000 Units total) by mouth once a week. 04/27/23   Bowen, Scot Jun, DO  norethindrone (MICRONOR) 0.35 MG tablet Take 1 tablet (0.35 mg total) by mouth daily. 01/21/23 04/01/23        Allergies    Tape    Review of Systems   Review of Systems  HENT:  Positive for congestion and ear pain.   Neurological:  Positive for headaches.  All other systems reviewed and are negative.   Physical Exam Updated Vital Signs BP 124/74   Pulse 74   Temp 97.7 F (36.5 C) (Oral)   Resp 18   LMP 05/23/2023   SpO2 98%  Physical Exam Vitals and nursing note reviewed.  Constitutional:  General: She is not in acute distress.    Appearance: Normal appearance. She is normal weight. She is not ill-appearing, toxic-appearing or diaphoretic.  HENT:     Head: Normocephalic and atraumatic.     Right Ear: Hearing, tympanic membrane, ear canal and external ear normal.     Left Ear: Hearing, ear canal and external ear normal. Tympanic membrane is not perforated.     Ears:     Comments: Left TM erythematous. No mastoid tenderness    Mouth/Throat:     Pharynx: Uvula midline.     Tonsils: No tonsillar exudate or tonsillar abscesses. 1+ on the right. 1+ on the left.  Eyes:     Extraocular  Movements: Extraocular movements intact.     Pupils: Pupils are equal, round, and reactive to light.  Neck:     Meningeal: Brudzinski's sign and Kernig's sign absent.     Comments: No meningismus Cardiovascular:     Rate and Rhythm: Normal rate and regular rhythm.     Heart sounds: Normal heart sounds.  Pulmonary:     Effort: Pulmonary effort is normal. No respiratory distress.     Breath sounds: Normal breath sounds.  Abdominal:     General: Abdomen is flat.     Palpations: Abdomen is soft.     Tenderness: There is no abdominal tenderness.  Musculoskeletal:        General: No tenderness. Normal range of motion.     Cervical back: Full passive range of motion without pain, normal range of motion and neck supple.     Right lower leg: No edema.     Left lower leg: No edema.  Skin:    General: Skin is warm and dry.  Neurological:     General: No focal deficit present.     Mental Status: She is alert and oriented to person, place, and time.     GCS: GCS eye subscore is 4. GCS verbal subscore is 5. GCS motor subscore is 6.     Sensory: Sensation is intact.     Motor: Motor function is intact.     Coordination: Coordination is intact.     Gait: Gait is intact.     Comments: Alert and oriented to self, place, time and event.    Speech is fluent, clear without dysarthria or dysphasia.    Strength 5/5 in upper/lower extremities   Sensation intact in upper/lower extremities    CN I not tested  CN II grossly intact visual fields bilaterally. Did not visualize posterior eye.  CN III, IV, VI PERRLA and EOMs intact bilaterally  CN V Intact sensation to sharp and light touch to the face  CN VII facial movements symmetric  CN VIII not tested  CN IX, X no uvula deviation, symmetric rise of soft palate  CN XI 5/5 SCM and trapezius strength bilaterally  CN XII Midline tongue protrusion, symmetric L/R movements   Psychiatric:        Mood and Affect: Mood normal.        Behavior: Behavior  normal.     ED Results / Procedures / Treatments   Labs (all labs ordered are listed, but only abnormal results are displayed) Labs Reviewed  CBC - Abnormal; Notable for the following components:      Result Value   RBC 5.32 (*)    MCV 72.7 (*)    MCH 22.9 (*)    All other components within normal limits  BASIC METABOLIC PANEL - Abnormal;  Notable for the following components:   Calcium 8.7 (*)    All other components within normal limits  GROUP A STREP BY PCR  RESP PANEL BY RT-PCR (RSV, FLU A&B, COVID)  RVPGX2  HCG, SERUM, QUALITATIVE    EKG None  Radiology No results found.  Procedures Procedures    Medications Ordered in ED Medications  prochlorperazine (COMPAZINE) injection 10 mg (10 mg Intravenous Given 07/03/23 1018)  diphenhydrAMINE (BENADRYL) injection 12.5 mg (12.5 mg Intravenous Given 07/03/23 1015)  ketorolac (TORADOL) 15 MG/ML injection 15 mg (15 mg Intravenous Given 07/03/23 1017)  dexamethasone (DECADRON) injection 10 mg (10 mg Intravenous Given 07/03/23 1017)    ED Course/ Medical Decision Making/ A&P                                 Medical Decision Making Amount and/or Complexity of Data Reviewed Labs: ordered.  Risk Prescription drug management.   This patient is a 37 y.o. female who presents to the ED for concern of headache, this involves an extensive number of treatment options, and is a complaint that carries with it a high risk of complications and morbidity. The emergent differential diagnosis prior to evaluation includes, but is not limited to, URI, otitis media, Stroke, increased ICP, meningitis, CVA, intracranial tumor, venous sinus thrombosis, migraine, cluster headache, hypertension, drug related, head injury, tension headache, sinusitis, dental abscess, otitis media, TMJ, glaucoma, trigeminal neuralgia.   This is not an exhaustive differential.   Past Medical History / Co-morbidities / Social History:  has a past medical history of  Allergy, Anemia, Anxiety, Constipation, Depression, GERD (gastroesophageal reflux disease), Hypertension, Polycystic ovarian disease, Seasonal allergies, Sleep apnea, and Vitamin D deficiency.  Additional history: Chart reviewed. Pertinent results include: diagnosed with strep 1 month ago after positive swab. Treated with amoxicillin  Physical Exam: Physical exam performed. The pertinent findings include: Alert and oriented and neurologically intact without focal deficits. Left TM erythematous  Lab Tests: I ordered, and personally interpreted labs.  The pertinent results include:  no acute laboratory abnormalities. RVP and strep negative   Medications: I ordered medication including compazine, benadryl, toradol, decadron  for headache. Reevaluation of the patient after these medicines showed that the patient resolved. I have reviewed the patients home medicines and have made adjustments as needed.   Disposition: After consideration of the diagnostic results and the patients response to treatment, I feel that emergency department workup does not suggest an emergent condition requiring admission or immediate intervention beyond what has been performed at this time. The plan is: Discharge with Augmentin for otitis media given she recently completed a course of amoxicillin.  Also recommend close outpatient follow-up and return precautions.  After medications, patient's symptoms have completely resolved and she is ready to go home.  Patient advised to discard her breast milk due to medication she has been given today.  Evaluation and diagnostic testing in the emergency department does not suggest an emergent condition requiring admission or immediate intervention beyond what has been performed at this time.  Plan for discharge with close PCP follow-up.  Patient is understanding and amenable with plan, educated on red flag symptoms that would prompt immediate return.  Patient discharged in stable  condition.  Final Clinical Impression(s) / ED Diagnoses Final diagnoses:  Recurrent acute suppurative otitis media without spontaneous rupture of left tympanic membrane  Acute nonintractable headache, unspecified headache type    Rx / DC Orders ED  Discharge Orders          Ordered    amoxicillin-clavulanate (AUGMENTIN) 875-125 MG tablet  Every 12 hours        07/03/23 1251          An After Visit Summary was printed and given to the patient.     Vear Clock 07/03/23 1252    Vanetta Mulders, MD 07/05/23 [phone]

## 2023-07-03 NOTE — Discharge Instructions (Addendum)
Discussed, your workup in the ER today was reassuring for acute findings.  It does look like you have an ear infection on the left side and we have treated this with Augmentin.  Please fill and take this medication as prescribed in its entirety.  I also recommend that you get plenty of rest, maintain adequate oral hydration, and follow-up closely with your primary doctor.  Return if development of any new or worsening symptoms.

## 2023-07-12 ENCOUNTER — Other Ambulatory Visit (HOSPITAL_COMMUNITY): Payer: Self-pay

## 2023-07-12 ENCOUNTER — Encounter: Payer: Self-pay | Admitting: Family Medicine

## 2023-07-12 ENCOUNTER — Telehealth (INDEPENDENT_AMBULATORY_CARE_PROVIDER_SITE_OTHER): Payer: Self-pay

## 2023-07-12 ENCOUNTER — Ambulatory Visit (INDEPENDENT_AMBULATORY_CARE_PROVIDER_SITE_OTHER): Payer: 59 | Admitting: Family Medicine

## 2023-07-12 VITALS — BP 134/86 | HR 70 | Temp 98.2°F | Ht 61.0 in | Wt 228.0 lb

## 2023-07-12 DIAGNOSIS — K9189 Other postprocedural complications and disorders of digestive system: Secondary | ICD-10-CM | POA: Diagnosis not present

## 2023-07-12 DIAGNOSIS — Z9884 Bariatric surgery status: Secondary | ICD-10-CM

## 2023-07-12 DIAGNOSIS — Z6841 Body Mass Index (BMI) 40.0 and over, adult: Secondary | ICD-10-CM

## 2023-07-12 DIAGNOSIS — D508 Other iron deficiency anemias: Secondary | ICD-10-CM | POA: Diagnosis not present

## 2023-07-12 DIAGNOSIS — E66813 Obesity, class 3: Secondary | ICD-10-CM

## 2023-07-12 DIAGNOSIS — E559 Vitamin D deficiency, unspecified: Secondary | ICD-10-CM | POA: Diagnosis not present

## 2023-07-12 MED ORDER — WEGOVY 0.25 MG/0.5ML ~~LOC~~ SOAJ
0.2500 mg | SUBCUTANEOUS | 0 refills | Status: DC
  Filled 2023-07-12 – 2023-08-09 (×4): qty 2, 28d supply, fill #0

## 2023-07-12 MED ORDER — VITAMIN D (ERGOCALCIFEROL) 1.25 MG (50000 UNIT) PO CAPS
50000.0000 [IU] | ORAL_CAPSULE | ORAL | 0 refills | Status: DC
  Filled 2023-07-12 – 2023-08-12 (×4): qty 6, 84d supply, fill #0

## 2023-07-12 NOTE — Assessment & Plan Note (Signed)
She has been requiring IV iron infusions with Dr Leonides Schanz She has a known dx of alpha thalassemia and c/o fatigue  She will have labs rechecked with hematology in Jan

## 2023-07-12 NOTE — Telephone Encounter (Signed)
Prior auth started (Key: [id number]

## 2023-07-12 NOTE — Assessment & Plan Note (Signed)
She is working on post op regain worsened by recent surrogate pregnancy, stress and emotional eating She has some volume restriction from her sleeve She has malabsorption issues of IDA, vitamin D def She is compliant with supplement intake  Continue to work with therapist on emotional stressors Continue current supplements Work on improving meal planning, eating schedule and qualify of food choices

## 2023-07-12 NOTE — Assessment & Plan Note (Signed)
Last vitamin D Lab Results  Component Value Date   VD25OH 73.3 04/27/2023   She is using RX vitamin D 50,000 international units  every 14 days Still c/o fatigue  Continue at current dose Repeat lab in [place]

## 2023-07-12 NOTE — Progress Notes (Signed)
Office: [phone]  /  Fax: [phone]  WEIGHT SUMMARY AND BIOMETRICS  Starting Date: 09/13/22  Starting Weight: 229lb   Weight Lost Since Last Visit: 0lb   Vitals Temp: 98.2 F (36.8 C) BP: 134/86 Pulse Rate: 70 SpO2: 98 %   Body Composition  Body Fat %: 49.9 % Fat Mass (lbs): 114 lbs Muscle Mass (lbs): 108.6 lbs Total Body Water (lbs): 77.2 lbs Visceral Fat Rating : 14    HPI  Chief Complaint: OBESITY  Natasha Rice is here to discuss her progress with her obesity treatment plan. She is on the the Category 3 Plan and states she is following her eating plan approximately 50 % of the time. She states she is exercising 0 minutes 0 times per week.  Interval History:  Since last office visit she is up 5 lb in the past 2 mos This gives her a net weight loss of 1 lb in the past 10 mos of medically supervised weight management She is still pumping for her surrogate baby but is wearning down and plans to stop in the next week She has struggled with post VSG weight regain She is in therapy and is practicing self care and stress reduction She has a good support system She is seeing Dr Leonides Schanz for IDA and alpha thalassemia, still complaining of fatigue  Pharmacotherapy: none  PHYSICAL EXAM:  Blood pressure 134/86, pulse 70, temperature 98.2 F (36.8 C), height 5\' 1"  (1.549 m), weight 228 lb (103.4 kg), last menstrual period 05/23/2023, SpO2 98%, unknown if currently breastfeeding. Body mass index is 43.08 kg/m.  General: She is overweight, cooperative, alert, well developed, and in no acute distress. PSYCH: Has normal mood, affect and thought process.   Lungs: Normal breathing effort, no conversational dyspnea.   ASSESSMENT AND PLAN  TREATMENT PLAN FOR OBESITY:  Recommended Dietary Goals  Natasha Rice is currently in the action stage of change. As such, her goal is to continue weight management plan. She has agreed to practicing portion control and making smarter  food choices, such as increasing vegetables and decreasing simple carbohydrates.  Behavioral Intervention  We discussed the following Behavioral Modification Strategies today: increasing lean protein intake to established goals, increasing fiber rich foods, increasing water intake , work on meal planning and preparation, keeping healthy foods at home, identifying sources and decreasing liquid calories, work on managing stress, creating time for self-care and relaxation, avoiding temptations and identifying enticing environmental cues, continue to work on implementation of reduced calorie nutritional plan, better snacking choices, and continue to work on maintaining a reduced calorie state, getting the recommended amount of protein, incorporating whole foods, making healthy choices, staying well hydrated and practicing mindfulness when eating..  Additional resources provided today: NA  Recommended Physical Activity Goals  [name] has been advised to work up to 150 minutes of moderate intensity aerobic activity a week and strengthening exercises 2-3 times per week for cardiovascular health, weight loss maintenance and preservation of muscle mass.   She has agreed to [employer] strengthening exercises with a goal of 2-3 sessions a week   Pharmacotherapy changes for the treatment of obesity: begin Wegovy 0.25 mg once weekly injection; Patient denies a personal or family history of pancreatitis, medullary thyroid carcinoma or multiple endocrine neoplasia type II. Recommend reviewing pen training video online. She will start [facility] AFTER she is completely weaned off of pumping breastmilk  ASSOCIATED CONDITIONS ADDRESSED TODAY  Vitamin D deficiency Assessment & Plan: Last vitamin D Lab Results  Component Value Date  VD25OH 73.3 04/27/2023   She is using RX vitamin D 50,000 international units  every 14 days Still c/o fatigue  Continue at current dose Repeat lab in FEB  Orders: -     Vitamin D  (Ergocalciferol); Take 1 capsule (50,000 Units total) by mouth every 14 (fourteen) days.  Dispense: 6 capsule; Refill: 0  Class 3 severe obesity due to excess calories with body mass index (BMI) of 40.0 to 44.9 in adult, unspecified whether serious comorbidity present (HCC) -     ZOXWRU; Inject 0.25 mg into the skin once a week.  Dispense: 2 mL; Refill: 0  S/P laparoscopic sleeve gastrectomy Assessment & Plan: She is working on post op regain worsened by recent surrogate pregnancy, stress and emotional eating She has some volume restriction from her sleeve She has malabsorption issues of IDA, vitamin D def She is compliant with supplement intake  Continue to work with therapist on emotional stressors Continue current supplements Work on improving meal planning, eating schedule and qualify of food choices   Iron deficiency anemia after gastrectomy Assessment & Plan: She has been requiring IV iron infusions with Dr Leonides Schanz She has a known dx of alpha thalassemia and c/o fatigue  She will have labs rechecked with hematology in Jan       She was informed of the importance of frequent follow up visits to maximize her success with intensive lifestyle modifications for her multiple health conditions.   ATTESTASTION STATEMENTS:  Reviewed by clinician on day of visit: allergies, medications, problem list, medical history, surgical history, family history, social history, and previous encounter notes pertinent to obesity diagnosis.   I have personally spent 30 minutes total time today in preparation, patient care, nutritional counseling and documentation for this visit, including the following: review of clinical lab tests; review of medical tests/procedures/services.      Glennis Brink, DO DABFM, DABOM [facility] [address] [place], [place] [address] [phone]

## 2023-07-13 ENCOUNTER — Encounter (INDEPENDENT_AMBULATORY_CARE_PROVIDER_SITE_OTHER): Payer: Self-pay

## 2023-07-13 ENCOUNTER — Other Ambulatory Visit (HOSPITAL_COMMUNITY): Payer: Self-pay

## 2023-07-15 ENCOUNTER — Emergency Department (HOSPITAL_COMMUNITY)
Admission: EM | Admit: 2023-07-15 | Discharge: 2023-07-15 | Disposition: A | Discharge status: Home / Self Care | Payer: 59 | Attending: Emergency Medicine | Admitting: Emergency Medicine

## 2023-07-15 ENCOUNTER — Encounter (HOSPITAL_COMMUNITY): Payer: Self-pay

## 2023-07-15 DIAGNOSIS — I10 Essential (primary) hypertension: Secondary | ICD-10-CM | POA: Diagnosis not present

## 2023-07-15 DIAGNOSIS — T424X5A Adverse effect of benzodiazepines, initial encounter: Secondary | ICD-10-CM | POA: Diagnosis not present

## 2023-07-15 DIAGNOSIS — R55 Syncope and collapse: Secondary | ICD-10-CM | POA: Diagnosis not present

## 2023-07-15 DIAGNOSIS — N939 Abnormal uterine and vaginal bleeding, unspecified: Secondary | ICD-10-CM | POA: Diagnosis not present

## 2023-07-15 DIAGNOSIS — R42 Dizziness and giddiness: Secondary | ICD-10-CM | POA: Diagnosis not present

## 2023-07-15 DIAGNOSIS — R000 Tachycardia, unspecified: Secondary | ICD-10-CM | POA: Diagnosis not present

## 2023-07-15 DIAGNOSIS — R0689 Other abnormalities of breathing: Secondary | ICD-10-CM | POA: Diagnosis not present

## 2023-07-15 DIAGNOSIS — R110 Nausea: Secondary | ICD-10-CM | POA: Diagnosis not present

## 2023-07-15 DIAGNOSIS — T50905A Adverse effect of unspecified drugs, medicaments and biological substances, initial encounter: Secondary | ICD-10-CM

## 2023-07-15 LAB — COMPREHENSIVE METABOLIC PANEL
ALT: 11 U/L (ref 0–44)
AST: 18 U/L (ref 15–41)
Albumin: 3 g/dL — ABNORMAL LOW (ref 3.5–5.0)
Alkaline Phosphatase: 68 U/L (ref 38–126)
Anion gap: 12 (ref 5–15)
BUN: 18 mg/dL (ref 6–20)
CO2: 20 mmol/L — ABNORMAL LOW (ref 22–32)
Calcium: 8.7 mg/dL — ABNORMAL LOW (ref 8.9–10.3)
Chloride: 108 mmol/L (ref 98–111)
Creatinine, Ser: 1.08 mg/dL — ABNORMAL HIGH (ref 0.44–1.00)
GFR, Estimated: >60 mL/min (ref >60)
Glucose, Bld: 123 mg/dL — ABNORMAL HIGH (ref 70–99)
Potassium: 4.2 mmol/L (ref 3.5–5.1)
Sodium: 140 mmol/L (ref 135–145)
Total Bilirubin: 0.2 mg/dL (ref <1.2)
Total Protein: 5.9 g/dL — ABNORMAL LOW (ref 6.5–8.1)

## 2023-07-15 LAB — CBC WITH DIFFERENTIAL/PLATELET
Abs Immature Granulocytes: 0.02 10*3/uL (ref 0.00–0.07)
Basophils Absolute: 0 10*3/uL (ref 0.0–0.1)
Basophils Relative: 0 %
Eosinophils Absolute: 0.1 10*3/uL (ref 0.0–0.5)
Eosinophils Relative: 1 %
HCT: 37.5 % (ref 36.0–46.0)
Hemoglobin: 11.9 g/dL — ABNORMAL LOW (ref 12.0–15.0)
Immature Granulocytes: 0 %
Lymphocytes Relative: 19 %
Lymphs Abs: 1.4 10*3/uL (ref 0.7–4.0)
MCH: 23.5 pg — ABNORMAL LOW (ref 26.0–34.0)
MCHC: 31.7 g/dL (ref 30.0–36.0)
MCV: 74.1 fL — ABNORMAL LOW (ref 80.0–100.0)
Monocytes Absolute: 0.6 10*3/uL (ref 0.1–1.0)
Monocytes Relative: 8 %
Neutro Abs: 5.3 10*3/uL (ref 1.7–7.7)
Neutrophils Relative %: 72 %
Platelets: 244 10*3/uL (ref 150–400)
RBC: 5.06 MIL/uL (ref 3.87–5.11)
RDW: 14.8 % (ref 11.5–15.5)
WBC: 7.4 10*3/uL (ref 4.0–10.5)
nRBC: 0 % (ref 0.0–0.2)

## 2023-07-15 LAB — TROPONIN I (HIGH SENSITIVITY)
Troponin I (High Sensitivity): 3 ng/L (ref <18)
Troponin I (High Sensitivity): 2 ng/L (ref <18)

## 2023-07-15 LAB — ETHANOL: Alcohol, Ethyl (B): <10 mg/dL (ref <10)

## 2023-07-15 MED ORDER — LACTATED RINGERS IV BOLUS
1000.0000 mL | Freq: Once | INTRAVENOUS | Status: AC
  Administered 2023-07-15: 1000 mL via INTRAVENOUS

## 2023-07-15 NOTE — ED Triage Notes (Signed)
Pt bib ems from [place] [employer]; begin feeling dizzy, nauseated, near syncope; started taking klonopin today, nuva ring placed yesterday, reports vaginals bleeding since yesterday; reports having to go through multiple pads within the hour; hr 146, bp 106 palp, 97% RA

## 2023-07-15 NOTE — Discharge Instructions (Signed)
Take only 1/2 Klonopin for anxiety when needed. Your symptoms today were likely due to this medication.   Follow up with your gynecologist for recheck of vaginal bleeding this coming week. If heavy bleeding occurs or persists, your hemoglobin will need close recheck.   Return to the ED with any new or worsening symptoms.

## 2023-07-15 NOTE — ED Notes (Signed)
Pt denies soaking more than one pad an hour

## 2023-07-15 NOTE — ED Notes (Signed)
PA at bedside to update patient.

## 2023-07-15 NOTE — ED Provider Notes (Signed)
[place] EMERGENCY DEPARTMENT AT [facility] Provider Note   CSN: [id number] Arrival date & time: 07/15/23  1413     History {Add pertinent medical, surgical, social history, OB history to HPI:1} Chief Complaint  Patient presents with   Near Syncope    Natasha Rice is a 37 y.o. female.  Patient to ED for evaluation of near syncopal symptoms that started just prior to arrival. She was eating lunch and felt lightheaded, had nausea without vomiting. No falls, no full passing out. She denies chest pain, SOB, recent illness or fever. She states she started Klonopin today for anxiety, which is the first time she has taken this mediation. Symptoms started afterward.    Near Syncope       Home Medications Prior to Admission medications   Medication Sig Start Date End Date Taking? Authorizing Provider  amoxicillin-clavulanate (AUGMENTIN) 875-125 MG tablet Take 1 tablet by mouth every 12 (twelve) hours. 07/03/23   Smoot, Shawn Route, PA-C  busPIRone (BUSPAR) 5 MG tablet Take 1 tablet (5 mg total) by mouth 2 (two) times daily. 05/03/23     clonazePAM (KLONOPIN) 0.5 MG tablet Take 1 tablet (0.5 mg total) by mouth 2 (two) times daily as needed for anxiety. 03/24/23     clonazePAM (KLONOPIN) 0.5 MG tablet Take 1 tablet (0.5 mg total) by mouth 2 (two) times daily. 05/31/23     etonogestrel-ethinyl estradiol (NUVARING) 0.12-0.015 MG/24HR vaginal ring Place 1 ring vaginally as directed 04/01/23     fluticasone (FLONASE) 50 MCG/ACT nasal spray Place 2 sprays into both nostrils daily. 05/31/23   Margaretann Loveless, PA-C  ibuprofen (ADVIL) 800 MG tablet Take 1 tablet (800 mg total) by mouth every 8 (eight) hours. For 10 days 12/24/22     linaclotide (LINZESS) 290 MCG CAPS capsule Take 1 capsule (290 mcg total) by mouth daily. 02/15/22     loratadine (CLARITIN) 10 MG tablet Take by mouth.    [provider]  NIFEdipine (PROCARDIA-XL/NIFEDICAL-XL) 30 MG 24 hr tablet Take 1 tablet (30  mg total) by mouth daily. 02/02/23     Prenatal Vit-Fe Fumarate-FA (MULTIVITAMIN-PRENATAL) 27-0.8 MG TABS tablet Take 1 tablet by mouth daily at 12 noon.    [provider]  Semaglutide-Weight Management (WEGOVY) 0.25 MG/0.5ML SOAJ Inject 0.25 mg into the skin once a week. 07/12/23   Bowen, Scot Jun, DO  valACYclovir (VALTREX) 500 MG tablet Take 1 tablet (500 mg total) by mouth 2 (two) times daily for 3 days as needed. 03/08/23     venlafaxine XR (EFFEXOR-XR) 75 MG 24 hr capsule Take 2 capsules (150 mg total) by mouth daily. 01/21/23     Vitamin D, Ergocalciferol, (DRISDOL) 1.25 MG (50000 UNIT) CAPS capsule Take 1 capsule (50,000 Units total) by mouth every 14 (fourteen) days. 07/12/23   Bowen, Scot Jun, DO  norethindrone (MICRONOR) 0.35 MG tablet Take 1 tablet (0.35 mg total) by mouth daily. 01/21/23 04/01/23        Allergies    Tape    Review of Systems   Review of Systems  Cardiovascular:  Positive for near-syncope.    Physical Exam Updated Vital Signs BP 124/86   Pulse (!) 120   Temp 98.4 F (36.9 C)   Resp 18   Ht 5\' 1"  (1.549 m)   Wt 103.4 kg   SpO2 98%   BMI 43.07 kg/m  Physical Exam Vitals and nursing note reviewed.  Constitutional:      Appearance: Normal appearance. She is  obese.     Comments: Patient appears drowsy, easy to awaken.  HENT:     Mouth/Throat:     Mouth: Mucous membranes are dry.  Neck:     Vascular: No carotid bruit.  Cardiovascular:     Rate and Rhythm: Tachycardia present.     Heart sounds: No murmur heard. Pulmonary:     Effort: Pulmonary effort is normal.     Breath sounds: No wheezing, rhonchi or rales.  Abdominal:     General: There is no distension.     Palpations: Abdomen is soft.     Tenderness: There is no abdominal tenderness.  Musculoskeletal:        General: Normal range of motion.     Cervical back: Normal range of motion and neck supple.  Skin:    General: Skin is warm and dry.  Neurological:     Mental Status: She is  oriented to person, place, and time.     ED Results / Procedures / Treatments   Labs (all labs ordered are listed, but only abnormal results are displayed) Labs Reviewed  CBC WITH DIFFERENTIAL/PLATELET  COMPREHENSIVE METABOLIC PANEL  URINALYSIS, ROUTINE W REFLEX MICROSCOPIC  RAPID URINE DRUG SCREEN, HOSP PERFORMED  ETHANOL  TROPONIN I (HIGH SENSITIVITY)    EKG None  Radiology No results found.  Procedures Procedures  {Document cardiac monitor, telemetry assessment procedure when appropriate:1}  Medications Ordered in ED Medications  lactated ringers bolus 1,000 mL (has no administration in time range)    ED Course/ Medical Decision Making/ A&P   {   Click here for ABCD2, HEART and other calculatorsREFRESH Note before signing :1}                              Medical Decision Making This patient presents to the ED for concern of near syncope, this involves an extensive number of treatment options, and is a complaint that carries with it a high risk of complications and morbidity.  The differential diagnosis includes arrhythmia, overdose, intoxication, medication reaction, anemia, vertigo, stroke   Co morbidities that complicate the patient evaluation  Chronic anemia for which she get iron infusions, HTN, GERD, polycystic ovarian   Additional history obtained:  Additional history and/or information obtained from chart review, notable for Previous charts   Lab Tests:  I Ordered, and personally interpreted labs.  The pertinent results include:  ***    Imaging Studies ordered:  I ordered imaging studies including *** I independently visualized and interpreted imaging which showed *** I agree with the radiologist interpretation   Cardiac Monitoring:  The patient was maintained on a cardiac monitor.  I personally viewed and interpreted the cardiac monitored which showed an underlying rhythm of: ***   Medicines ordered and prescription drug management:  I  ordered medication including ***  for *** Reevaluation of the patient after these medicines showed that the patient {resolved/improved/worsened:23923::"improved"} I have reviewed the patients home medicines and have made adjustments as needed   Test Considered:  ***   Critical Interventions:  ***   Consultations Obtained:  I requested consultation with the ***,  and discussed lab and imaging findings as well as pertinent plan - they recommend: ***   Problem List / ED Course:  ***   Reevaluation:  After the interventions noted above, I reevaluated the patient and found that they have :{resolved/improved/worsened:23923::"improved"}   Social Determinants of Health:  ***   Disposition:  After  consideration of the diagnostic results and the patients response to treatment, I feel that the patient would benefit from ***.    ***  {Document critical care time when appropriate:1} {Document review of labs and clinical decision tools ie heart score, Chads2Vasc2 etc:1}  {Document your independent review of radiology images, and any outside records:1} {Document your discussion with family members, caretakers, and with consultants:1} {Document social determinants of health affecting pt's care:1} {Document your decision making why or why not admission, treatments were needed:1} Final Clinical Impression(s) / ED Diagnoses Final diagnoses:  None    Rx / DC Orders ED Discharge Orders     None

## 2023-07-15 NOTE — ED Notes (Signed)
Patient sitting comfortably in bed, patient stated she is feeling a lot better and stated "I may just have been very dehydrated". Patient A/Ox4, no concerns at this time

## 2023-07-19 ENCOUNTER — Other Ambulatory Visit (HOSPITAL_COMMUNITY): Payer: Self-pay

## 2023-07-19 DIAGNOSIS — F4323 Adjustment disorder with mixed anxiety and depressed mood: Secondary | ICD-10-CM | POA: Diagnosis not present

## 2023-07-19 MED ORDER — VENLAFAXINE HCL ER 150 MG PO CP24
150.0000 mg | ORAL_CAPSULE | Freq: Every day | ORAL | 0 refills | Status: DC
  Filled 2023-07-19: qty 90, 90d supply, fill #0

## 2023-07-19 MED ORDER — VENLAFAXINE HCL ER 150 MG PO CP24
150.0000 mg | ORAL_CAPSULE | Freq: Every day | ORAL | 0 refills | Status: DC
  Filled 2023-07-19 – 2023-07-21 (×3): qty 90, 90d supply, fill #0

## 2023-07-19 MED ORDER — BUSPIRONE HCL 10 MG PO TABS
10.0000 mg | ORAL_TABLET | Freq: Two times a day (BID) | ORAL | 0 refills | Status: DC
  Filled 2023-07-19: qty 180, 90d supply, fill #0
  Filled 2023-07-21: qty 60, 30d supply, fill #0
  Filled 2023-08-16 – 2023-08-17 (×2): qty 60, 30d supply, fill #1
  Filled 2023-09-17: qty 60, 30d supply, fill #2

## 2023-07-21 ENCOUNTER — Other Ambulatory Visit (HOSPITAL_COMMUNITY): Payer: Self-pay

## 2023-08-05 ENCOUNTER — Inpatient Hospital Stay: Payer: Commercial Managed Care - PPO | Attending: Hematology and Oncology

## 2023-08-05 ENCOUNTER — Inpatient Hospital Stay: Payer: Commercial Managed Care - PPO

## 2023-08-05 DIAGNOSIS — D563 Thalassemia minor: Secondary | ICD-10-CM | POA: Diagnosis not present

## 2023-08-05 DIAGNOSIS — Z823 Family history of stroke: Secondary | ICD-10-CM | POA: Diagnosis not present

## 2023-08-05 DIAGNOSIS — R5383 Other fatigue: Secondary | ICD-10-CM | POA: Insufficient documentation

## 2023-08-05 DIAGNOSIS — Z818 Family history of other mental and behavioral disorders: Secondary | ICD-10-CM | POA: Diagnosis not present

## 2023-08-05 DIAGNOSIS — Z825 Family history of asthma and other chronic lower respiratory diseases: Secondary | ICD-10-CM | POA: Diagnosis not present

## 2023-08-05 DIAGNOSIS — Z8249 Family history of ischemic heart disease and other diseases of the circulatory system: Secondary | ICD-10-CM | POA: Insufficient documentation

## 2023-08-05 DIAGNOSIS — Z814 Family history of other substance abuse and dependence: Secondary | ICD-10-CM | POA: Insufficient documentation

## 2023-08-05 DIAGNOSIS — Z9089 Acquired absence of other organs: Secondary | ICD-10-CM | POA: Diagnosis not present

## 2023-08-05 DIAGNOSIS — Z8261 Family history of arthritis: Secondary | ICD-10-CM | POA: Insufficient documentation

## 2023-08-05 DIAGNOSIS — N938 Other specified abnormal uterine and vaginal bleeding: Secondary | ICD-10-CM | POA: Diagnosis not present

## 2023-08-05 DIAGNOSIS — Z833 Family history of diabetes mellitus: Secondary | ICD-10-CM | POA: Insufficient documentation

## 2023-08-05 DIAGNOSIS — Z83438 Family history of other disorder of lipoprotein metabolism and other lipidemia: Secondary | ICD-10-CM | POA: Diagnosis not present

## 2023-08-05 DIAGNOSIS — Z811 Family history of alcohol abuse and dependence: Secondary | ICD-10-CM | POA: Insufficient documentation

## 2023-08-05 DIAGNOSIS — Z820 Family history of epilepsy and other diseases of the nervous system: Secondary | ICD-10-CM | POA: Insufficient documentation

## 2023-08-05 DIAGNOSIS — Z793 Long term (current) use of hormonal contraceptives: Secondary | ICD-10-CM | POA: Diagnosis not present

## 2023-08-05 DIAGNOSIS — D509 Iron deficiency anemia, unspecified: Secondary | ICD-10-CM | POA: Insufficient documentation

## 2023-08-05 DIAGNOSIS — I10 Essential (primary) hypertension: Secondary | ICD-10-CM | POA: Insufficient documentation

## 2023-08-05 DIAGNOSIS — Z79624 Long term (current) use of inhibitors of nucleotide synthesis: Secondary | ICD-10-CM | POA: Diagnosis not present

## 2023-08-05 DIAGNOSIS — Z79899 Other long term (current) drug therapy: Secondary | ICD-10-CM | POA: Insufficient documentation

## 2023-08-05 DIAGNOSIS — K219 Gastro-esophageal reflux disease without esophagitis: Secondary | ICD-10-CM | POA: Diagnosis not present

## 2023-08-05 DIAGNOSIS — Z9884 Bariatric surgery status: Secondary | ICD-10-CM | POA: Diagnosis not present

## 2023-08-05 DIAGNOSIS — N920 Excessive and frequent menstruation with regular cycle: Secondary | ICD-10-CM | POA: Insufficient documentation

## 2023-08-05 DIAGNOSIS — Z8349 Family history of other endocrine, nutritional and metabolic diseases: Secondary | ICD-10-CM | POA: Diagnosis not present

## 2023-08-05 DIAGNOSIS — D500 Iron deficiency anemia secondary to blood loss (chronic): Secondary | ICD-10-CM

## 2023-08-05 LAB — RETIC PANEL
Immature Retic Fract: 13.3 % (ref 2.3–15.9)
RBC.: 5.58 MIL/uL — ABNORMAL HIGH (ref 3.87–5.11)
Retic Count, Absolute: 58.6 10*3/uL (ref 19.0–186.0)
Retic Ct Pct: 1.1 % (ref 0.4–3.1)
Reticulocyte Hemoglobin: 27.4 pg — ABNORMAL LOW (ref >27.9)

## 2023-08-05 LAB — CMP (CANCER CENTER ONLY)
ALT: 13 U/L (ref 0–44)
AST: 15 U/L (ref 15–41)
Albumin: 3.9 g/dL (ref 3.5–5.0)
Alkaline Phosphatase: 94 U/L (ref 38–126)
Anion gap: 4 — ABNORMAL LOW (ref 5–15)
BUN: 14 mg/dL (ref 6–20)
CO2: 29 mmol/L (ref 22–32)
Calcium: 9.1 mg/dL (ref 8.9–10.3)
Chloride: 105 mmol/L (ref 98–111)
Creatinine: 0.97 mg/dL (ref 0.44–1.00)
GFR, Estimated: >60 mL/min (ref >60)
Glucose, Bld: 100 mg/dL — ABNORMAL HIGH (ref 70–99)
Potassium: 4.5 mmol/L (ref 3.5–5.1)
Sodium: 138 mmol/L (ref 135–145)
Total Bilirubin: 0.3 mg/dL (ref 0.0–1.2)
Total Protein: 6.9 g/dL (ref 6.5–8.1)

## 2023-08-05 LAB — CBC WITH DIFFERENTIAL (CANCER CENTER ONLY)
Abs Immature Granulocytes: 0.02 10*3/uL (ref 0.00–0.07)
Basophils Absolute: 0 10*3/uL (ref 0.0–0.1)
Basophils Relative: 1 %
Eosinophils Absolute: 0.1 10*3/uL (ref 0.0–0.5)
Eosinophils Relative: 1 %
HCT: 40.4 % (ref 36.0–46.0)
Hemoglobin: 12.8 g/dL (ref 12.0–15.0)
Immature Granulocytes: 0 %
Lymphocytes Relative: 33 %
Lymphs Abs: 2 10*3/uL (ref 0.7–4.0)
MCH: 23.2 pg — ABNORMAL LOW (ref 26.0–34.0)
MCHC: 31.7 g/dL (ref 30.0–36.0)
MCV: 73.3 fL — ABNORMAL LOW (ref 80.0–100.0)
Monocytes Absolute: 0.6 10*3/uL (ref 0.1–1.0)
Monocytes Relative: 9 %
Neutro Abs: 3.4 10*3/uL (ref 1.7–7.7)
Neutrophils Relative %: 56 %
Platelet Count: 258 10*3/uL (ref 150–400)
RBC: 5.51 MIL/uL — ABNORMAL HIGH (ref 3.87–5.11)
RDW: 14.6 % (ref 11.5–15.5)
WBC Count: 6.1 10*3/uL (ref 4.0–10.5)
nRBC: 0 % (ref 0.0–0.2)

## 2023-08-05 LAB — IRON AND IRON BINDING CAPACITY (CC-WL,HP ONLY)
Iron: 86 ug/dL (ref 28–170)
Saturation Ratios: 27 % (ref 10.4–31.8)
TIBC: 318 ug/dL (ref 250–450)
UIBC: 232 ug/dL (ref 148–442)

## 2023-08-05 LAB — FERRITIN: Ferritin: 218 ng/mL (ref 11–307)

## 2023-08-08 ENCOUNTER — Other Ambulatory Visit (HOSPITAL_COMMUNITY): Payer: Self-pay

## 2023-08-08 ENCOUNTER — Other Ambulatory Visit: Payer: Self-pay | Admitting: Family Medicine

## 2023-08-08 MED ORDER — CEPHALEXIN 500 MG PO CAPS
500.0000 mg | ORAL_CAPSULE | Freq: Three times a day (TID) | ORAL | 0 refills | Status: DC
  Filled 2023-08-08: qty 21, 7d supply, fill #0

## 2023-08-08 MED ORDER — LINACLOTIDE 290 MCG PO CAPS
290.0000 ug | ORAL_CAPSULE | Freq: Every day | ORAL | 1 refills | Status: DC
  Filled 2023-08-08: qty 90, 90d supply, fill #0
  Filled 2023-09-20 – 2023-11-02 (×2): qty 90, 90d supply, fill #1

## 2023-08-09 ENCOUNTER — Other Ambulatory Visit (HOSPITAL_COMMUNITY): Payer: Self-pay

## 2023-08-11 ENCOUNTER — Other Ambulatory Visit (HOSPITAL_COMMUNITY): Payer: Self-pay

## 2023-08-11 DIAGNOSIS — F4323 Adjustment disorder with mixed anxiety and depressed mood: Secondary | ICD-10-CM | POA: Diagnosis not present

## 2023-08-11 MED ORDER — HYDROXYZINE PAMOATE 25 MG PO CAPS
25.0000 mg | ORAL_CAPSULE | Freq: Three times a day (TID) | ORAL | 0 refills | Status: DC | PRN
  Filled 2023-08-11: qty 90, 30d supply, fill #0

## 2023-08-12 ENCOUNTER — Inpatient Hospital Stay (HOSPITAL_BASED_OUTPATIENT_CLINIC_OR_DEPARTMENT_OTHER): Payer: Commercial Managed Care - PPO | Admitting: Hematology and Oncology

## 2023-08-12 ENCOUNTER — Other Ambulatory Visit: Payer: Self-pay | Admitting: Family Medicine

## 2023-08-12 ENCOUNTER — Other Ambulatory Visit (HOSPITAL_COMMUNITY): Payer: Self-pay

## 2023-08-12 ENCOUNTER — Other Ambulatory Visit: Payer: Self-pay

## 2023-08-12 VITALS — BP 140/84 | HR 83 | Temp 97.8°F | Resp 15 | Wt 233.5 lb

## 2023-08-12 DIAGNOSIS — D500 Iron deficiency anemia secondary to blood loss (chronic): Secondary | ICD-10-CM | POA: Diagnosis not present

## 2023-08-12 DIAGNOSIS — D509 Iron deficiency anemia, unspecified: Secondary | ICD-10-CM | POA: Diagnosis not present

## 2023-08-12 DIAGNOSIS — I10 Essential (primary) hypertension: Secondary | ICD-10-CM | POA: Diagnosis not present

## 2023-08-12 DIAGNOSIS — Z793 Long term (current) use of hormonal contraceptives: Secondary | ICD-10-CM | POA: Diagnosis not present

## 2023-08-12 DIAGNOSIS — R5383 Other fatigue: Secondary | ICD-10-CM | POA: Diagnosis not present

## 2023-08-12 DIAGNOSIS — Z9089 Acquired absence of other organs: Secondary | ICD-10-CM | POA: Diagnosis not present

## 2023-08-12 DIAGNOSIS — D563 Thalassemia minor: Secondary | ICD-10-CM | POA: Diagnosis not present

## 2023-08-12 DIAGNOSIS — Z79624 Long term (current) use of inhibitors of nucleotide synthesis: Secondary | ICD-10-CM | POA: Diagnosis not present

## 2023-08-12 DIAGNOSIS — N920 Excessive and frequent menstruation with regular cycle: Secondary | ICD-10-CM | POA: Diagnosis not present

## 2023-08-12 DIAGNOSIS — K219 Gastro-esophageal reflux disease without esophagitis: Secondary | ICD-10-CM | POA: Diagnosis not present

## 2023-08-12 DIAGNOSIS — Z79899 Other long term (current) drug therapy: Secondary | ICD-10-CM | POA: Diagnosis not present

## 2023-08-12 NOTE — Progress Notes (Unsigned)
[facility] [phone]   [phone]  PROGRESS NOTE  Patient Care Team: Clayborne Dana, NP as PCP - General (Family Medicine) Reynold Bowen., MD (Obstetrics and Gynecology)  Hematological/Oncological History # Iron Deficiency Anemia in Setting of Pregnancy   07/16/2022: establish care with Dr. Leonides Schanz. Hgb 11.1, Sat 8% with ferritin of 35.  08/02/2022 to 08/09/2022: received 2 doses of IV feraheme 510 mg 05/24/2023--07/01/2023: Deceived 5 doses of IV Venofer 200 mg weekly.  Complicated by IV iron infiltration in the arm.  Interval History:  [place] 38 y.o. female with medical history significant for iron deficiency anemia in pregnancy who presents for a follow up visit. The patient's last visit was on 05/06/2023. In the interim since the last visit she received her IV iron therapy but unfortunately had infiltration of the iron.  On exam today Mrs. Meriweather reports she feels like her energy levels are getting better.  She reports that she did have an IV iron infusion which unfortunately infiltrated.  She does have discoloration of her right arm.  She reports that she notes that she is still having some occasional fatigue and coldness in her hands and feet.  She reports that she is taking vitamin D as well as a vitamin B12 complex.  She reports that she has not been having any trouble with lightheadedness, dizziness, shortness of breath.  She denies any runny nose, sore throat, or cough.  She reports she recently had an ear infection for which she was on Augmentin as well as mastitis for which she was on Keflex.  Otherwise she denies any fevers, chills, sweats, nausea, vomiting or diarrhea.  She denies any overt signs of bleeding, bruising, or dark stools.  A full 10 point ROS is otherwise negative..  A full 10 point ROS is otherwise negative.  MEDICAL HISTORY:  Past Medical History:  Diagnosis Date   Allergy    Anemia    Anxiety    Constipation     Depression    GERD (gastroesophageal reflux disease)    Hypertension    Polycystic ovarian disease    Seasonal allergies    Sleep apnea    Vitamin D deficiency     SURGICAL HISTORY: Past Surgical History:  Procedure Laterality Date   CESAREAN SECTION     GASTRIC BYPASS     TONSILLECTOMY      SOCIAL HISTORY: Social History   Socioeconomic History   Marital status: Single    Spouse name: Not on file   Number of children: Not on file   Years of education: Not on file   Highest education level: Not on file  Occupational History   Not on file  Tobacco Use   Smoking status: Never   Smokeless tobacco: Never  Vaping Use   Vaping status: Never Used  Substance and Sexual Activity   Alcohol use: Not Currently    Alcohol/week: 2.0 standard drinks of alcohol    Types: 2 Glasses of wine per week    Comment: Occ   Drug use: No   Sexual activity: Yes    Birth control/protection: Condom  Other Topics Concern   Not on file  Social History Narrative   Not on file   Social Drivers of Health   Financial Resource Strain: Low Risk  (07/30/2021)   Received [facility]   Overall Financial Resource Strain (CARDIA)    Difficulty of Paying Living Expenses: Not hard at all  Food  Insecurity: Low Risk  (12/15/2022)   [facility]   Hunger Vital Sign    Worried About Running Out of Food in the Last Year: Never true    Ran Out of Food in the Last Year: Never true  Transportation Needs: Not on file (12/15/2022)  Physical Activity: Insufficiently Active (07/30/2021)   [facility] from [facility], [facility]   Exercise Vital Sign    Days of Exercise per Week: 3 days    Minutes of Exercise per Session: 20 min  Stress: Stress Concern Present (07/30/2021)   Received from [facility], [facility] - Occupational Stress Questionnaire    Feeling of Stress : Rather much  Social Connections: Unknown  (11/30/2021)   Received from [facility], [facility]   Social Network    Social Network: Not on file  Intimate Partner Violence: Unknown (10/28/2021)   Received from [facility], [facility]   HITS    Physically Hurt: Not on file    Insult or Talk Down To: Not on file    Threaten Physical Harm: Not on file    Scream or Curse: Not on file    FAMILY HISTORY: Family History  Problem Relation Age of Onset   Stroke Mother    Heart disease Mother    Drug abuse Mother    Alcohol abuse Mother    Depression Mother    Bipolar disorder Mother    Alcoholism Mother    Alcoholism Father    Drug abuse Father    Diabetes Father    Hypertension Father    Hyperlipidemia Father    Schizophrenia Father    Miscarriages / Stillbirths Sister    Hypertension Sister    Early death Sister    Intellectual disability Maternal Grandmother    Arthritis Maternal Grandmother    Hypertension Maternal Grandmother    Intellectual disability Maternal Grandfather    Hypertension Maternal Grandfather    COPD Maternal Grandfather    Alcohol abuse Maternal Grandfather    Heart attack Maternal Grandfather    Diabetes Paternal Grandmother    Arthritis Paternal Grandmother    Hypertension Paternal Grandmother    Intellectual disability Paternal Grandmother    Dementia Paternal Grandmother    Alcohol abuse Paternal Grandfather    Intellectual disability Paternal Grandfather    Congestive Heart Failure Son     ALLERGIES:  is allergic to tape.  MEDICATIONS:  Current Outpatient Medications  Medication Sig Dispense Refill   etonogestrel-ethinyl estradiol (NUVARING) 0.12-0.015 MG/24HR vaginal ring Place 1 each vaginally every 28 (twenty-eight) days.     busPIRone (BUSPAR) 10 MG tablet Take 1 tablet (10 mg total) by mouth 2 (two) times daily. 180 tablet 0   cephALEXin (KEFLEX) 500 MG capsule Take 1 capsule (500 mg total) by mouth 3 (three) times daily for 7 days 21 capsule 0   clonazePAM (KLONOPIN) 0.5  MG tablet Take 1 tablet (0.5 mg total) by mouth 2 (two) times daily. 20 tablet 0   fluticasone (FLONASE) 50 MCG/ACT nasal spray Place 2 sprays into both nostrils daily. 16 g 0   hydrOXYzine (VISTARIL) 25 MG capsule Take 1 capsule (25 mg total) by mouth every 8 (eight) hours as needed for anxiety. (to help with sleep) 90 capsule 0   ibuprofen (ADVIL) 800 MG tablet Take 1 tablet (800 mg total) by mouth every 8 (eight) hours. For 10 days 30 tablet 11   Insulin Pen Needle (PEN NEEDLES)  31G X 5 MM MISC Use once daily as directed 50 each 1   linaclotide (LINZESS) 290 MCG CAPS capsule Take 1 capsule (290 mcg total) by mouth daily. 90 capsule 1   liraglutide (VICTOZA) 18 MG/3ML SOPN Inject 0.6 mg into the skin daily. 3 mL 0   loratadine (CLARITIN) 10 MG tablet Take by mouth.     NIFEdipine (PROCARDIA-XL/NIFEDICAL-XL) 30 MG 24 hr tablet Take 1 tablet (30 mg total) by mouth daily. 60 tablet 2   Prenatal Vit-Fe Fumarate-FA (MULTIVITAMIN-PRENATAL) 27-0.8 MG TABS tablet Take 1 tablet by mouth daily at 12 noon.     valACYclovir (VALTREX) 500 MG tablet Take 1 tablet (500 mg total) by mouth 2 (two) times daily for 3 days as needed. 30 tablet 5   venlafaxine XR (EFFEXOR-XR) 150 MG 24 hr capsule Take 1 capsule (150 mg total) by mouth daily. 90 capsule 0   Vitamin D, Ergocalciferol, (DRISDOL) 1.25 MG (50000 UNIT) CAPS capsule Take 1 capsule (50,000 Units total) by mouth every 14 (fourteen) days. 6 capsule 0   No current facility-administered medications for this visit.    REVIEW OF SYSTEMS:   Constitutional: ( - ) fevers, ( - )  chills , ( - ) night sweats Eyes: ( - ) blurriness of vision, ( - ) double vision, ( - ) watery eyes Ears, nose, mouth, throat, and face: ( - ) mucositis, ( - ) sore throat Respiratory: ( - ) cough, ( - ) dyspnea, ( - ) wheezes Cardiovascular: ( - ) palpitation, ( - ) chest discomfort, ( - ) lower extremity swelling Gastrointestinal:  ( - ) nausea, ( - ) heartburn, ( - ) change in bowel  habits Skin: ( - ) abnormal skin rashes Lymphatics: ( - ) new lymphadenopathy, ( - ) easy bruising Neurological: ( - ) numbness, ( - ) tingling, ( - ) new weaknesses Behavioral/Psych: ( - ) mood change, ( - ) new changes  All other systems were reviewed with the patient and are negative.  PHYSICAL EXAMINATION:  Vitals:   08/12/23 1448  BP: (!) 140/84  Pulse: 83  Resp: 15  Temp: 97.8 F (36.6 C)  SpO2: 100%     Filed Weights   08/12/23 1448  Weight: 233 lb 8 oz (105.9 kg)      GENERAL: well appearing young [place] female, alert, no distress and comfortable SKIN: skin color, texture, turgor are normal, no rashes or significant lesions EYES: conjunctiva are pink and non-injected, sclera clear LUNGS: clear to auscultation and percussion with normal breathing effort HEART: regular rate & rhythm and no murmurs and no lower extremity edema Musculoskeletal: no cyanosis of digits and no clubbing  ABDOMEN: pregnant PSYCH: alert & oriented x 3, fluent speech NEURO: no focal motor/sensory deficits  LABORATORY DATA:  I have reviewed the data as listed    Latest Ref Rng & Units 08/05/2023    1:41 PM 07/15/2023    3:48 PM 07/03/2023   10:20 AM  CBC  WBC 4.0 - 10.5 K/uL 6.1  7.4  9.3   Hemoglobin 12.0 - 15.0 g/dL 44.0  10.2  72.5   Hematocrit 36.0 - 46.0 % 40.4  37.5  38.7   Platelets 150 - 400 K/uL 258  244  269        Latest Ref Rng & Units 08/05/2023    1:41 PM 07/15/2023    3:48 PM 07/03/2023   10:20 AM  CMP  Glucose 70 - 99 mg/dL 366  123  93   BUN 6 - 20 mg/dL 14  18  13    Creatinine 0.44 - 1.00 mg/dL 7.82  9.56  2.13   Sodium 135 - 145 mmol/L 138  140  136   Potassium 3.5 - 5.1 mmol/L 4.5  4.2  4.4   Chloride 98 - 111 mmol/L 105  108  104   CO2 22 - 32 mmol/L 29  20  25    Calcium 8.9 - 10.3 mg/dL 9.1  8.7  8.7   Total Protein 6.5 - 8.1 g/dL 6.9  5.9    Total Bilirubin 0.0 - 1.2 mg/dL 0.3  0.2    Alkaline Phos 38 - 126 U/L 94  68    AST 15 - 41 U/L 15   18    ALT 0 - 44 U/L 13  11     RADIOGRAPHIC STUDIES: No results found.  ASSESSMENT & PLAN [place] 38 y.o. female with medical history significant for iron deficiency anemia in pregnancy who presents for a follow up visit.  # Iron Deficiency Anemia in Setting of Pregnancy   # Alpha Thalassemia Minor  -- Findings are consistent with iron deficiency anemia secondary to patient's menorrhagia --Encouraged her to follow-up with OB/GYN for better control of her menstrual cycles following her delivery. -- Patient unable to tolerate ferrous sulfate --Patient gave birth to a healthy baby boy in June 2024.  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 --patient received 2 doses of IV Feraheme with normalization of her iron levels, but she had persistently low Hgb and MCV.  Found to have alpha thalassemia trait. PLAN: --labs today show white blood cell 6.1, hemoglobin 12.8, MCV 73.3, platelets 258.  Iron studies show iron sat of 27% with ferritin of 218 -- If her iron levels are persistently low will proceed with IV iron therapy.  --Plan for return to clinic in 6 months time with labs 1 week before  No orders of the defined types were placed in this encounter.  All questions were answered. The patient knows to call the clinic with any problems, questions or concerns.  A total of more than 30 minutes were spent on this encounter with face-to-face time and non-face-to-face time, including preparing to see the patient, ordering tests and/or medications, counseling the patient and coordination of care as outlined above.   Ulysees Barns, MD Department of Hematology/Oncology [facility] at [facility] Phone: [phone] Pager: [phone] Email: [email or web address]  08/16/2023 4:24 PM

## 2023-08-16 ENCOUNTER — Encounter (HOSPITAL_COMMUNITY): Payer: Self-pay

## 2023-08-16 ENCOUNTER — Ambulatory Visit (INDEPENDENT_AMBULATORY_CARE_PROVIDER_SITE_OTHER): Payer: Commercial Managed Care - PPO | Admitting: Family Medicine

## 2023-08-16 ENCOUNTER — Other Ambulatory Visit: Payer: Self-pay

## 2023-08-16 ENCOUNTER — Other Ambulatory Visit (HOSPITAL_BASED_OUTPATIENT_CLINIC_OR_DEPARTMENT_OTHER): Payer: Self-pay

## 2023-08-16 ENCOUNTER — Encounter: Payer: Self-pay | Admitting: Family Medicine

## 2023-08-16 ENCOUNTER — Other Ambulatory Visit (HOSPITAL_COMMUNITY): Payer: Self-pay

## 2023-08-16 VITALS — BP 128/88 | HR 93 | Temp 97.5°F | Ht 61.0 in | Wt 230.0 lb

## 2023-08-16 DIAGNOSIS — Z6841 Body Mass Index (BMI) 40.0 and over, adult: Secondary | ICD-10-CM

## 2023-08-16 DIAGNOSIS — E559 Vitamin D deficiency, unspecified: Secondary | ICD-10-CM

## 2023-08-16 DIAGNOSIS — D508 Other iron deficiency anemias: Secondary | ICD-10-CM

## 2023-08-16 DIAGNOSIS — F32A Depression, unspecified: Secondary | ICD-10-CM | POA: Diagnosis not present

## 2023-08-16 DIAGNOSIS — E66813 Obesity, class 3: Secondary | ICD-10-CM

## 2023-08-16 DIAGNOSIS — K9189 Other postprocedural complications and disorders of digestive system: Secondary | ICD-10-CM | POA: Diagnosis not present

## 2023-08-16 DIAGNOSIS — Z9884 Bariatric surgery status: Secondary | ICD-10-CM | POA: Diagnosis not present

## 2023-08-16 DIAGNOSIS — F419 Anxiety disorder, unspecified: Secondary | ICD-10-CM | POA: Diagnosis not present

## 2023-08-16 MED ORDER — LIRAGLUTIDE 18 MG/3ML ~~LOC~~ SOPN: 0.6000 mg | PEN_INJECTOR | Freq: Every day | SUBCUTANEOUS | 0 refills | Status: DC

## 2023-08-16 MED ORDER — PEN NEEDLES 31G X 5 MM MISC: 1 refills | Status: DC

## 2023-08-16 MED ORDER — VITAMIN D (ERGOCALCIFEROL) 1.25 MG (50000 UNIT) PO CAPS
50000.0000 [IU] | ORAL_CAPSULE | ORAL | 0 refills | Status: DC
  Filled 2023-08-16 – 2023-09-17 (×3): qty 6, 84d supply, fill #0

## 2023-08-16 NOTE — Assessment & Plan Note (Signed)
Last vitamin D Lab Results  Component Value Date   VD25OH 73.3 04/27/2023   She has done well on RX vitamin D q 14 days  Energy level is still low due to stress, IDA -- improved and recovering from months of pumping  Continue RX vitamin D q 14 days

## 2023-08-16 NOTE — Assessment & Plan Note (Signed)
Iron and Hemoglobin have improved after 5 IV iron infusions with Dr Leonides Schanz Menses are still regulating after finishing pumping for surrogate baby Fatigue is still there  Continue to monitor for IDA in the coming months Continue on a MVI daily

## 2023-08-16 NOTE — Assessment & Plan Note (Signed)
Pt is now up 2 lb from her pre op weight Nov 2017 prior to vertical sleeve gastrectomy with Dr Clent Ridges Pre op weight 227 lb and nadir weight 154 lb She has regained with 2 pregnancies since VSG and now has less volume restriction Has struggled more with emotional eating and cravings, in counseling  Continue to work on 4-5 small meals each day with lean protein + fiber Drink water outside of mealtime Add a tsp of chia seeds to oatmeal to bump up fiber intake due to constipation Continue a MVI daily and RX vitamin D q 14 days

## 2023-08-16 NOTE — Progress Notes (Signed)
Office: [phone]  /  Fax: [phone]  WEIGHT SUMMARY AND BIOMETRICS  Starting Date: 09/13/22  Starting Weight: 229lb   Weight Lost Since Last Visit: 0lb   Vitals Temp: (!) 97.5 F (36.4 C) BP: 128/88 Pulse Rate: 93 SpO2: 99 %   Body Composition  Body Fat %: 49 % Fat Mass (lbs): 112.8 lbs Muscle Mass (lbs): 111.2 lbs Total Body Water (lbs): 75.8 lbs Visceral Fat Rating : 14   HPI  Chief Complaint: OBESITY  Natasha Natasha [name] is here to discuss her progress with her obesity treatment plan. Natasha [name] is on the the Category 3 Plan and states Natasha [name] is following her eating plan approximately 75 % of the time. Natasha [name] states Natasha [name] is exercising 30 minutes 2 times per week.  Interval History:  Since last office visit Natasha [name] is up 2 lb Muscle mass is up 2.6 lb and body fat is down 1.2 lb This gives her a net weight gain of 1 lb in 11 mos of medically supervised weight management Natasha [name] Natasha [name] finished pumping from surrogate pregnancy 12 days ago Natasha [name] did add in [employer] drinkable yogurt to bump up her protein intake Natasha [name] is feeling hungrier and Natasha [name] some sweet cravings Natasha [name] still Natasha [name] some volume restriction from her sleeve gastrectomy Natasha [name] did receive 5 IV iron infusions with Dr Leonides Schanz but still feels tired Natasha [name] Natasha [name] started to go to therapy for anxiety Her insurance did not cover Wegovy Natasha [name] is doing the rower at the gym 15 min + walking on the treadmill 15 min 2 x a week Natasha [name] is using NuvaRing for birth control  Pharmacotherapy: none  PHYSICAL EXAM:  Blood pressure 128/88, pulse 93, temperature (!) 97.5 F (36.4 C), height 5\' 1"  (1.549 m), weight 230 lb (104.3 kg), SpO2 99%, unknown if currently breastfeeding. Body mass index is 43.46 kg/m.  General: Natasha [name] is overweight, cooperative, alert, well developed, and in no acute distress. PSYCH: Natasha [name] normal mood, affect and thought process.   Lungs: Normal breathing effort, no conversational dyspnea.   ASSESSMENT AND PLAN  TREATMENT PLAN FOR  OBESITY:  Recommended Dietary Goals  Natasha Natasha [name] is currently in the action stage of change. As such, her goal is to continue weight management plan. Natasha [name] Natasha [name] agreed to following a lower carbohydrate, vegetable and lean protein rich diet plan.  Behavioral Intervention  We discussed the following Behavioral Modification Strategies today: increasing lean protein intake to established goals, increasing vegetables, increasing fiber rich foods, increasing water intake , work on meal planning and preparation, keeping healthy foods at home, identifying sources and decreasing liquid calories, practice mindfulness eating and understand the difference between hunger signals and cravings, work on managing stress, creating time for self-care and relaxation, avoiding temptations and identifying enticing environmental cues, continue to practice mindfulness when eating, and continue to work on maintaining a reduced calorie state, getting the recommended amount of protein, incorporating whole foods, making healthy choices, staying well hydrated and practicing mindfulness when eating..  Additional resources provided today: NA  Recommended Physical Activity Goals  Natasha Natasha [name] been advised to work up to 150 minutes of moderate intensity aerobic activity a week and strengthening exercises 2-3 times per week for cardiovascular health, weight loss maintenance and preservation of muscle mass.   Natasha [name] Natasha [name] agreed to Increase the intensity, frequency or duration of strengthening exercises   Pharmacotherapy changes for the treatment of obesity: begin Victoza (off label) at 0.6 mg once daily injection Patient denies a personal or family history of pancreatitis, medullary thyroid carcinoma or  multiple endocrine neoplasia type II. Recommend reviewing pen training video online. Reviewed MOA and potential adverse SE On NuvaRing for birth control Reviewed [email or web address] for further information  ASSOCIATED CONDITIONS ADDRESSED  TODAY  Weight gain following gastric bypass surgery Assessment & Plan: Pt is now up 2 lb from her pre op weight Nov 2017 prior to vertical sleeve gastrectomy with Dr Clent Ridges Pre op weight 227 lb and nadir weight 154 lb Natasha [name] Natasha [name] regained with 2 pregnancies since VSG and now Natasha [name] less volume restriction Natasha [name] struggled more with emotional eating and cravings, in counseling  Continue to work on 4-5 small meals each day with lean protein + fiber Drink water outside of mealtime Add a tsp of chia seeds to oatmeal to bump up fiber intake due to constipation Continue a MVI daily and RX vitamin D q 14 days  Orders: -     Liraglutide; Inject 0.6 mg into the skin daily.  Dispense: 3 mL; Refill: 0  Vitamin D deficiency Assessment & Plan: Last vitamin D Lab Results  Component Value Date   VD25OH 73.3 04/27/2023   Natasha [name] Natasha [name] done well on RX vitamin D q 14 days  Energy level is still low due to stress, IDA -- improved and recovering from months of pumping  Continue RX vitamin D q 14 days  Orders: -     Vitamin D (Ergocalciferol); Take 1 capsule (50,000 Units total) by mouth every 14 (fourteen) days.  Dispense: 6 capsule; Refill: 0  Class 3 severe obesity due to excess calories with body mass index (BMI) of 40.0 to 44.9 in adult, unspecified whether serious comorbidity present (HCC)  Iron deficiency anemia after gastrectomy Assessment & Plan: Iron and Hemoglobin have improved after 5 IV iron infusions with Dr Leonides Schanz Menses are still regulating after finishing pumping for surrogate baby Fatigue is still there  Continue to monitor for IDA in the coming months Continue on a MVI daily   Anxiety and depression Assessment & Plan: Natasha [name] worsened since last visit due to stress of work, family and caring for her grandmother Reports some improvements in mood on Effexor XR 150 mg daily and Buspar 10 mg bid and counseling Natasha [name] reports that adding in exercise 2 days/ wk and taking better care of herself have  also helped  Continue current treatment plan Improve sleep to 8 hrs at night Practice mindful eating    Other orders -     Pen Needles; Use once daily as directed  Dispense: 50 each; Refill: 1      Natasha [name] was informed of the importance of frequent follow up visits to maximize her success with intensive lifestyle modifications for her multiple health conditions.   ATTESTASTION STATEMENTS:  Reviewed by clinician on day of visit: allergies, medications, problem list, medical history, surgical history, family history, social history, and previous encounter notes pertinent to obesity diagnosis.   I have personally spent 30 minutes total time today in preparation, patient care, nutritional counseling and documentation for this visit, including the following: review of clinical lab tests; review of medical tests/procedures/services.      Glennis Brink, DO DABFM, DABOM [facility] [address] [place], [place] [address] [phone]

## 2023-08-16 NOTE — Assessment & Plan Note (Signed)
Has worsened since last visit due to stress of work, family and caring for her grandmother Reports some improvements in mood on Effexor XR 150 mg daily and Buspar 10 mg bid and counseling She reports that adding in exercise 2 days/ wk and taking better care of herself have also helped  Continue current treatment plan Improve sleep to 8 hrs at night Practice mindful eating

## 2023-08-17 ENCOUNTER — Other Ambulatory Visit (HOSPITAL_COMMUNITY): Payer: Self-pay

## 2023-08-17 ENCOUNTER — Other Ambulatory Visit (HOSPITAL_BASED_OUTPATIENT_CLINIC_OR_DEPARTMENT_OTHER): Payer: Self-pay

## 2023-08-19 ENCOUNTER — Telehealth: Payer: Self-pay | Admitting: *Deleted

## 2023-08-19 NOTE — Telephone Encounter (Signed)
TCT patient regarding recent lab results. Spoke with patient. Advised that her iron levels look great with a ferritin of 218. Will plan to see her back as scheduled in 6 months time. Pt voiced understanding. No questions or concerns. She is aware of her appts in July 2025

## 2023-08-19 NOTE — Telephone Encounter (Signed)
-----   Message from Ulysees Barns IV sent at 08/16/2023  4:24 PM EST ----- Please let Natasha Rice know that her iron levels look great with a ferritin of 218.  Will plan to see her back as scheduled in 6 months time. ----- Message ----- From: Leory Plowman, Lab In [place] Sent: 08/05/2023   1:51 PM EST To: Jaci Standard, MD

## 2023-08-23 ENCOUNTER — Other Ambulatory Visit (HOSPITAL_COMMUNITY): Payer: Self-pay

## 2023-08-25 ENCOUNTER — Other Ambulatory Visit (HOSPITAL_BASED_OUTPATIENT_CLINIC_OR_DEPARTMENT_OTHER): Payer: Self-pay

## 2023-08-25 ENCOUNTER — Other Ambulatory Visit (HOSPITAL_COMMUNITY): Payer: Self-pay

## 2023-08-25 ENCOUNTER — Other Ambulatory Visit: Payer: Self-pay

## 2023-08-25 MED ORDER — PROGESTERONE 200 MG PO CAPS: 200.0000 mg | ORAL_CAPSULE | Freq: Every evening | ORAL | 1 refills | Status: DC

## 2023-08-25 MED ORDER — PROGESTERONE 200 MG PO CAPS
200.0000 mg | ORAL_CAPSULE | Freq: Every evening | ORAL | 1 refills | Status: DC
  Filled 2023-08-25: qty 7, 7d supply, fill #0

## 2023-08-25 MED ORDER — PROGESTERONE 200 MG PO CAPS
200.0000 mg | ORAL_CAPSULE | Freq: Every day | ORAL | 1 refills | Status: DC
  Filled 2023-08-25 (×2): qty 7, 7d supply, fill #0

## 2023-09-14 DIAGNOSIS — R1031 Right lower quadrant pain: Secondary | ICD-10-CM | POA: Diagnosis not present

## 2023-09-17 ENCOUNTER — Other Ambulatory Visit (HOSPITAL_COMMUNITY): Payer: Self-pay

## 2023-09-19 ENCOUNTER — Other Ambulatory Visit: Payer: Self-pay

## 2023-09-20 ENCOUNTER — Other Ambulatory Visit (HOSPITAL_COMMUNITY): Payer: Self-pay

## 2023-09-20 ENCOUNTER — Other Ambulatory Visit: Payer: Self-pay | Admitting: Family Medicine

## 2023-09-20 MED ORDER — ETONOGESTREL-ETHINYL ESTRADIOL 0.12-0.015 MG/24HR VA RING
VAGINAL_RING | VAGINAL | 3 refills | Status: DC
  Filled 2023-09-20: qty 2, 42d supply, fill #0

## 2023-09-21 ENCOUNTER — Ambulatory Visit (INDEPENDENT_AMBULATORY_CARE_PROVIDER_SITE_OTHER): Payer: Commercial Managed Care - PPO | Admitting: Family Medicine

## 2023-09-21 ENCOUNTER — Encounter: Payer: Self-pay | Admitting: Family Medicine

## 2023-09-21 VITALS — BP 118/82 | HR 82 | Temp 98.1°F | Ht 61.0 in | Wt 224.0 lb

## 2023-09-21 DIAGNOSIS — E559 Vitamin D deficiency, unspecified: Secondary | ICD-10-CM | POA: Diagnosis not present

## 2023-09-21 DIAGNOSIS — Z6841 Body Mass Index (BMI) 40.0 and over, adult: Secondary | ICD-10-CM

## 2023-09-21 DIAGNOSIS — E88819 Insulin resistance, unspecified: Secondary | ICD-10-CM

## 2023-09-21 DIAGNOSIS — R5383 Other fatigue: Secondary | ICD-10-CM

## 2023-09-21 DIAGNOSIS — Z9884 Bariatric surgery status: Secondary | ICD-10-CM | POA: Diagnosis not present

## 2023-09-21 DIAGNOSIS — E66813 Obesity, class 3: Secondary | ICD-10-CM

## 2023-09-21 MED ORDER — LIRAGLUTIDE 18 MG/3ML ~~LOC~~ SOPN: 1.2000 mg | PEN_INJECTOR | Freq: Every day | SUBCUTANEOUS | 0 refills | Status: DC

## 2023-09-21 NOTE — Progress Notes (Signed)
 Office: [phone]  /  Fax: [phone]  WEIGHT SUMMARY AND BIOMETRICS  Starting Date: 09/13/22  Starting Weight: 229lb   Weight Lost Since Last Visit: 6lb   Vitals Temp: 98.1 F (36.7 C) BP: 118/82 Pulse Rate: 82 SpO2: 99 %   Body Composition  Body Fat %: 49.1 % Fat Mass (lbs): 110 lbs Muscle Mass (lbs): 108.2 lbs Total Body Water (lbs): 75.4 lbs Visceral Fat Rating : 14     HPI  Chief Complaint: OBESITY  Natasha Rice is here to discuss her progress with her obesity treatment plan. She is on the the Category 3 Plan and states she is following her eating plan approximately 65-70 % of the time. She states she is exercising 30 minutes 2-3 times per week.   Interval History:  Since last office visit she is down 6 pounds This gives her a net weight loss of 7 pounds in the past 11 months of medically supervised weight management She had a surrogate pregnancy with baby born in May 2024 She has done well in the past month with improving meal planning, portion control and healthy food choices. She has been enjoying the added satiety with reduced cravings on Victoza.  She increased her dose to 1.2 mg once daily injection, using off label as a cash pay She still complains of high levels of fatigue She has not been taking her multivitamin daily Stress levels are reducing She plans to increase her exercise and spring  Pharmacotherapy: Victoza 1.2 mg once daily injection  PHYSICAL EXAM:  Blood pressure 118/82, pulse 82, temperature 98.1 F (36.7 C), height 5\' 1"  (1.549 m), weight 224 lb (101.6 kg), SpO2 99%, unknown if currently breastfeeding. Body mass index is 42.32 kg/m.  General: She is overweight, cooperative, alert, well developed, and in no acute distress. PSYCH: Has normal mood, affect and thought process.   Lungs: Normal breathing effort, no conversational dyspnea.   ASSESSMENT AND PLAN  TREATMENT PLAN FOR OBESITY:  Recommended Dietary Goals  Natasha Rice  is currently in the action stage of change. As such, her goal is to continue weight management plan. She has agreed to the Category 3 Plan.  Behavioral Intervention  We discussed the following Behavioral Modification Strategies today: increasing lean protein intake to established goals, increasing fiber rich foods, increasing water intake , work on meal planning and preparation, keeping healthy foods at home, decreasing eating out or consumption of processed foods, and making healthy choices when eating convenient foods, practice mindfulness eating and understand the difference between hunger signals and cravings, work on managing stress, creating time for self-care and relaxation, avoiding temptations and identifying enticing environmental cues, planning for success, and continue to work on maintaining a reduced calorie state, getting the recommended amount of protein, incorporating whole foods, making healthy choices, staying well hydrated and practicing mindfulness when eating.. Add in 1-2 high-protein snacks daily  Additional resources provided today: NA  Recommended Physical Activity Goals  Natasha Rice has been advised to work up to 150 minutes of moderate intensity aerobic activity a week and strengthening exercises 2-3 times per week for cardiovascular health, weight loss maintenance and preservation of muscle mass.   She has agreed to [employer] strengthening exercises with a goal of 2-3 sessions a week  and Start aerobic activity with a goal of 150 minutes a week at moderate intensity.   Pharmacotherapy changes for the treatment of obesity: None  ASSOCIATED CONDITIONS ADDRESSED TODAY  Weight gain following gastric bypass surgery Improving.  Patient feels improved satiety  with the addition of Victoza, used off label at 1.2 mg once daily injection.  She has been able to reduce portion sizes at mealtime and has a reduction of cravings.  She has lost additional lean muscle mass this visit and is  encouraged to add in more consistent exercise including both cardio and resistance training as well as increasing protein from snacks  Resume a bariatric multivitamin daily -     Liraglutide; Inject 1.2 mg into the skin daily.  Dispense: 9 mL; Refill: 0  Other fatigue Update labs today.  Her iron levels and hemoglobin have greatly improved after IV iron infusions with Dr. Leonides Schanz.  She is now having regular menses and denies dysfunctional uterine bleeding -     Cortisol -     TSH Rfx on Abnormal to Free T4  Vitamin D deficiency Last vitamin D Lab Results  Component Value Date   VD25OH 73.3 04/27/2023  She has been taking vitamin D 50,000 IU q. 14 days.  Recheck level today  -     VITAMIN D 25 Hydroxy (Vit-D Deficiency, Fractures)  Insulin resistance She has a history of insulin resistance currently off metformin.  She is doing well with the addition of Victoza, used off label without type 2 diabetes.  She has been reducing her intake of sweets and starches.  Increase walking time to 30 minutes 5 days a week or choose another form of cardio.  At next visit, we will added resistance training 2 days a week.  Consider the addition of metformin -     Insulin, random  Class 3 severe obesity due to excess calories with body mass index (BMI) of 40.0 to 44.9 in adult, unspecified whether serious comorbidity present [facility])      She was informed of the importance of frequent follow up visits to maximize her success with intensive lifestyle modifications for her multiple health conditions.   ATTESTASTION STATEMENTS:  Reviewed by clinician on day of visit: allergies, medications, problem list, medical history, surgical history, family history, social history, and previous encounter notes pertinent to obesity diagnosis.   I have personally spent 30 minutes total time today in preparation, patient care, nutritional counseling and documentation for this visit, including the following: review of  clinical lab tests; review of medical tests/procedures/services.      Glennis Brink, DO DABFM, DABOM [facility] [address] [place], [place] [address] [phone]

## 2023-09-22 LAB — INSULIN, RANDOM: INSULIN: 9 u[IU]/mL (ref 2.6–24.9)

## 2023-09-22 LAB — TSH RFX ON ABNORMAL TO FREE T4: TSH: 1.1 u[IU]/mL (ref 0.450–4.500)

## 2023-09-22 LAB — CORTISOL: Cortisol: 13.9 ug/dL (ref 6.2–19.4)

## 2023-09-22 LAB — VITAMIN D 25 HYDROXY (VIT D DEFICIENCY, FRACTURES): Vit D, 25-Hydroxy: 65.7 ng/mL (ref 30.0–100.0)

## 2023-09-23 ENCOUNTER — Other Ambulatory Visit (HOSPITAL_COMMUNITY): Payer: Self-pay

## 2023-09-23 DIAGNOSIS — F4323 Adjustment disorder with mixed anxiety and depressed mood: Secondary | ICD-10-CM | POA: Diagnosis not present

## 2023-09-23 MED ORDER — VENLAFAXINE HCL ER 150 MG PO CP24
150.0000 mg | ORAL_CAPSULE | Freq: Every day | ORAL | 0 refills | Status: DC
  Filled 2023-09-29 – 2023-10-03 (×3): qty 90, 90d supply, fill #0

## 2023-09-23 MED ORDER — BUSPIRONE HCL 10 MG PO TABS
10.0000 mg | ORAL_TABLET | Freq: Two times a day (BID) | ORAL | 0 refills | Status: DC
  Filled 2023-10-18: qty 60, 30d supply, fill #0

## 2023-09-29 ENCOUNTER — Other Ambulatory Visit (HOSPITAL_COMMUNITY): Payer: Self-pay

## 2023-09-29 MED ORDER — WEGOVY 0.25 MG/0.5ML ~~LOC~~ SOAJ
0.2500 mg | SUBCUTANEOUS | 0 refills | Status: DC
  Filled 2023-09-29: qty 2, 28d supply, fill #0

## 2023-09-29 NOTE — Telephone Encounter (Signed)
 Re submitted the prior authorization for [facility] through [employer]. Faxed over form with office notes.

## 2023-09-29 NOTE — Telephone Encounter (Signed)
 Prior authorization for patients Natasha Rice was approved by her medicaid. Patient notified.

## 2023-09-29 NOTE — Addendum Note (Signed)
 Addended by: Glennis Brink on: 09/29/2023 03:56 PM   Modules accepted: Orders

## 2023-10-03 ENCOUNTER — Other Ambulatory Visit: Payer: Self-pay

## 2023-10-03 ENCOUNTER — Other Ambulatory Visit (HOSPITAL_COMMUNITY): Payer: Self-pay

## 2023-10-13 ENCOUNTER — Other Ambulatory Visit (HOSPITAL_COMMUNITY): Payer: Self-pay

## 2023-10-13 ENCOUNTER — Telehealth: Admitting: Nurse Practitioner

## 2023-10-13 DIAGNOSIS — J0140 Acute pansinusitis, unspecified: Secondary | ICD-10-CM

## 2023-10-13 MED ORDER — AMOXICILLIN-POT CLAVULANATE 875-125 MG PO TABS
1.0000 | ORAL_TABLET | Freq: Two times a day (BID) | ORAL | 0 refills | Status: AC
Start: 2023-10-13 — End: 2023-10-20
  Filled 2023-10-13: qty 14, 7d supply, fill #0

## 2023-10-13 NOTE — Progress Notes (Signed)
 E-Visit for Sinus Problems  We are sorry that you are not feeling well.  Here is how we plan to help!  Based on what you have shared with me it looks like you have sinusitis.  Sinusitis is inflammation and infection in the sinus cavities of the head.  Based on your presentation I believe you most likely have Acute Bacterial Sinusitis.  This is an infection caused by bacteria and is treated with antibiotics. I have prescribed Augmentin 875mg /125mg  one tablet twice daily with food, for 7 days. You may use an oral decongestant such as Mucinex D or if you have glaucoma or high blood pressure use plain Mucinex. Saline nasal spray help and can safely be used as often as needed for congestion.  If you develop worsening sinus pain, fever or notice severe headache and vision changes, or if symptoms are not better after completion of antibiotic, please schedule an appointment with a health care provider.    Sinus infections are not as easily transmitted as other respiratory infection, however we still recommend that you avoid close contact with loved ones, especially the very young and elderly.  Remember to wash your hands thoroughly throughout the day as this is the number one way to prevent the spread of infection!  Home Care: Only take medications as instructed by your medical team. Complete the entire course of an antibiotic. Do not take these medications with alcohol. A steam or ultrasonic humidifier can help congestion.  You can place a towel over your head and breathe in the steam from hot water coming from a faucet. Avoid close contacts especially the very young and the elderly. Cover your mouth when you cough or sneeze. Always remember to wash your hands.  Get Help Right Away If: You develop worsening fever or sinus pain. You develop a severe head ache or visual changes. Your symptoms persist after you have completed your treatment plan.  Make sure you Understand these instructions. Will watch  your condition. Will get help right away if you are not doing well or get worse.  Thank you for choosing an e-visit.  Your e-visit answers were reviewed by a board certified advanced clinical practitioner to complete your personal care plan. Depending upon the condition, your plan could have included both over the counter or prescription medications.  Please review your pharmacy choice. Make sure the pharmacy is open so you can pick up prescription now. If there is a problem, you may contact your provider through [employer] and have the prescription routed to another pharmacy.  Your safety is important to [place]. If you have drug allergies check your prescription carefully.   For the next 24 hours you can use MyChart to ask questions about today's visit, request a non-urgent call back, or ask for a work or school excuse. You will get an email in the next two days asking about your experience. I hope that your e-visit has been valuable and will speed your recovery.   I spent approximately 5 minutes reviewing the patient's history, current symptoms and coordinating their care today.

## 2023-10-18 ENCOUNTER — Other Ambulatory Visit (HOSPITAL_COMMUNITY): Payer: Self-pay

## 2023-10-19 ENCOUNTER — Encounter: Payer: Self-pay | Admitting: Family Medicine

## 2023-10-19 ENCOUNTER — Other Ambulatory Visit (HOSPITAL_BASED_OUTPATIENT_CLINIC_OR_DEPARTMENT_OTHER): Payer: Self-pay

## 2023-10-19 ENCOUNTER — Ambulatory Visit (INDEPENDENT_AMBULATORY_CARE_PROVIDER_SITE_OTHER): Admitting: Medical

## 2023-10-19 VITALS — BP 128/92 | HR 81 | Temp 98.0°F | Resp 18 | Ht 61.0 in | Wt 228.0 lb

## 2023-10-19 DIAGNOSIS — I10 Essential (primary) hypertension: Secondary | ICD-10-CM | POA: Diagnosis not present

## 2023-10-19 DIAGNOSIS — R519 Headache, unspecified: Secondary | ICD-10-CM

## 2023-10-19 MED ORDER — AMLODIPINE BESYLATE 2.5 MG PO TABS
2.5000 mg | ORAL_TABLET | Freq: Every day | ORAL | 0 refills | Status: DC
  Filled 2023-10-19: qty 30, 30d supply, fill #0

## 2023-10-19 MED ORDER — CYCLOBENZAPRINE HCL 5 MG PO TABS
5.0000 mg | ORAL_TABLET | Freq: Every day | ORAL | 0 refills | Status: DC
  Filled 2023-10-19: qty 3, 3d supply, fill #0

## 2023-10-19 NOTE — Patient Instructions (Signed)
 Hypertension Hypertension persists with systolic 130-140 and diastolic over 90. Discussed amlodipine 2.5 mg daily. Last echo normal. - Prescribe amlodipine 2.5 mg daily. - Discontinue NuvaRing per gyn instruction. - Check blood pressure daily for 7 days and update via MyChart. - Recommend low salt and low caffeine diet. - Consider increasing amlodipine to 5 mg if hypertension persists after 7 days.  Tension Headache Persistent headache likely tension-related. Advised Tylenol over NSAIDs due to blood pressure concerns. - Switch to Tylenol instead of NSAIDs for headache. - Prescribe Flexeril 5 mg, 3 tablets, as needed for tension headache. - Update on headache improvement with Tylenol and Flexeril.  Anxiety Anxiety managed with Buspar and Effexor. Stress may affect blood pressure. - Continue Buspar and Effexor. - Monitor for anxiety-related symptoms affecting blood pressure.  General Health Maintenance Advised regular blood pressure monitoring and healthy lifestyle. - Purchase a home blood pressure monitor. - Adopt lifestyle modifications including low salt and low caffeine diet.  Follow-up Discussed follow-up with gynecologist. - Follow up with gynecologist next month. - Update our office with blood pressure readings in 7 days. - Consider follow-up appointment in 2-3 weeks if needed.(will update your pcp on bp readings/plan)

## 2023-10-19 NOTE — Progress Notes (Addendum)
 Subjective:    Patient ID: Natasha Rice, female    DOB: Jun 07, 1986, 38 y.o.   MRN: [id number]  HPI NORIAH OSGOOD is a 38 year old female with hypertension who presents with persistent headaches and elevated blood pressure readings.  She has a history of hypertension, which was present prior to and during her pregnancy. She was on nifedipine during her pregnancy but discontinued it in October or November of last year. Her blood pressure readings have been elevated recently, with measurements of 130/104 and 130/106 taken at work. Her blood pressure was elevated during pregnancy but had leveled out post-delivery. She does not have a blood pressure cuff at home and relies on a colleague to take her readings at work.  She experiences persistent headaches localized to the head, described as tension headaches that do not improve with Excedrin or ibuprofen. She has been taking ibuprofen twice yesterday and once this morning without relief. The headaches are accompanied by nausea, and she has not eaten much due to lack of appetite. Her last menstrual cycle began on March 7th, and she is currently using the NuvaRing for birth control.  She experiences anxiety and is currently taking Buspar and Effexor for management. She recalls a previous anxiety attack that led to chest pain and a negative workup, including an EKG. Her anxiety is now medicated, and she does not feel stressed at work.  She has a family history of cardiovascular issues, with her sister having died from an aortic dissection in 2021, possibly related to untreated hypertension. Her grandfather has congestive heart failure and pulmonary hypertension.     Review of Systems  Constitutional:  Negative for chills, fatigue and fever.  HENT:  Negative for congestion, ear discharge and ear pain.   Respiratory:  Negative for cough, chest tightness and wheezing.   Cardiovascular:  Negative for chest pain and palpitations.   Gastrointestinal:  Negative for abdominal pain, constipation, nausea and vomiting.  Genitourinary:  Negative for dysuria and flank pain.  Musculoskeletal:  Negative for back pain.  Skin:  Negative for rash.  Neurological:  Negative for dizziness, speech difficulty, weakness and light-headedness.       Mild ha presently. Trapezius pain.  Hematological:  Negative for adenopathy. Does not bruise/bleed easily.  Psychiatric/Behavioral:  Negative for behavioral problems, decreased concentration and hallucinations. The patient is not nervous/anxious.     Past Medical History:  Diagnosis Date   Allergy    Anemia    Anxiety    Constipation    Depression    GERD (gastroesophageal reflux disease)    Hypertension    Polycystic ovarian disease    Seasonal allergies    Sleep apnea    Vitamin D deficiency      Social History   Socioeconomic History   Marital status: Single    Spouse name: Not on file   Number of children: Not on file   Years of education: Not on file   Highest education level: Associate degree: occupational, Scientist, product/process development, or vocational program  Occupational History   Not on file  Tobacco Use   Smoking status: Never   Smokeless tobacco: Never  Vaping Use   Vaping status: Never Used  Substance and Sexual Activity   Alcohol use: Not Currently    Alcohol/week: 2.0 standard drinks of alcohol    Types: 2 Glasses of wine per week    Comment: Occ   Drug use: No   Sexual activity: Yes    Birth control/protection: Condom  Other Topics Concern   Not on file  Social History Narrative   Not on file   Social Drivers of Health   Financial Resource Strain: Low Risk  (10/19/2023)   Overall Financial Resource Strain (CARDIA)    Difficulty of Paying Living Expenses: Not hard at all  Food Insecurity: No Food Insecurity (10/19/2023)   Hunger Vital Sign    Worried About Running Out of Food in the Last Year: Never true    Ran Out of Food in the Last Year: Never true   Transportation Needs: No Transportation Needs (10/19/2023)   PRAPARE - Administrator, Civil Service (Medical): No    Lack of Transportation (Non-Medical): No  Physical Activity: Insufficiently Active (10/19/2023)   Exercise Vital Sign    Days of Exercise per Week: 2 days    Minutes of Exercise per Session: 30 min  Stress: Stress Concern Present (10/19/2023)   [employer] of Occupational Health - Occupational Stress Questionnaire    Feeling of Stress : To some extent  Social Connections: Moderately Isolated (10/19/2023)   Social Connection and Isolation Panel [NHANES]    Frequency of Communication with Friends and Family: More than three times a week    Frequency of Social Gatherings with Friends and Family: Once a week    Attends Religious Services: 1 to 4 times per year    Active Member of [employer] or Organizations: No    Attends Engineer, structural: Not on file    Marital Status: Never married  Intimate Partner Violence: Unknown (10/28/2021)   Received from [employer], [facility]   HITS    Physically Hurt: Not on file    Insult or Talk Down To: Not on file    Threaten Physical Harm: Not on file    Scream or Curse: Not on file    Past Surgical History:  Procedure Laterality Date   CESAREAN SECTION     GASTRIC BYPASS     TONSILLECTOMY      Family History  Problem Relation Age of Onset   Stroke Mother    Heart disease Mother    Drug abuse Mother    Alcohol abuse Mother    Depression Mother    Bipolar disorder Mother    Alcoholism Mother    Alcoholism Father    Drug abuse Father    Diabetes Father    Hypertension Father    Hyperlipidemia Father    Schizophrenia Father    Miscarriages / Stillbirths Sister    Hypertension Sister    Early death Sister    Intellectual disability Maternal Grandmother    Arthritis Maternal Grandmother    Hypertension Maternal Grandmother    Intellectual disability Maternal Grandfather    Hypertension  Maternal Grandfather    COPD Maternal Grandfather    Alcohol abuse Maternal Grandfather    Heart attack Maternal Grandfather    Diabetes Paternal Grandmother    Arthritis Paternal Grandmother    Hypertension Paternal Grandmother    Intellectual disability Paternal Grandmother    Dementia Paternal Grandmother    Alcohol abuse Paternal Grandfather    Intellectual disability Paternal Grandfather    Congestive Heart Failure Son     Allergies  Allergen Reactions   Tape Hives    Current Outpatient Medications on File Prior to Visit  Medication Sig Dispense Refill   amoxicillin-clavulanate (AUGMENTIN) 875-125 MG tablet Take 1 tablet by mouth 2 (two) times daily for 7 days. 14 tablet 0   busPIRone (BUSPAR)  10 MG tablet Take 1 tablet (10 mg total) by mouth 2 (two) times daily. 180 tablet 0   cephALEXin (KEFLEX) 500 MG capsule Take 1 capsule (500 mg total) by mouth 3 (three) times daily for 7 days 21 capsule 0   etonogestrel-ethinyl estradiol (NUVARING) 0.12-0.015 MG/24HR vaginal ring Place 1 each vaginally every 28 (twenty-eight) days.     etonogestrel-ethinyl estradiol (NUVARING) 0.12-0.015 MG/24HR vaginal ring Place in vagina for 3 weeks and then remove for 1 week. 2 each 3   fluticasone (FLONASE) 50 MCG/ACT nasal spray Place 2 sprays into both nostrils daily. 16 g 0   hydrOXYzine (VISTARIL) 25 MG capsule Take 1 capsule (25 mg total) by mouth every 8 (eight) hours as needed for anxiety. (to help with sleep) 90 capsule 0   ibuprofen (ADVIL) 800 MG tablet Take 1 tablet (800 mg total) by mouth every 8 (eight) hours. For 10 days 30 tablet 11   Insulin Pen Needle (PEN NEEDLES) 31G X 5 MM MISC Use once daily as directed 50 each 1   linaclotide (LINZESS) 290 MCG CAPS capsule Take 1 capsule (290 mcg total) by mouth daily. 90 capsule 1   loratadine (CLARITIN) 10 MG tablet Take by mouth.     NIFEdipine (PROCARDIA-XL/NIFEDICAL-XL) 30 MG 24 hr tablet Take 1 tablet (30 mg total) by mouth daily. 60 tablet  2   Prenatal Vit-Fe Fumarate-FA (MULTIVITAMIN-PRENATAL) 27-0.8 MG TABS tablet Take 1 tablet by mouth daily at 12 noon.     progesterone (PROMETRIUM) 200 MG capsule Take 1 capsule (200 mg total) by mouth every night for 7 days. 7 capsule 1   Semaglutide-Weight Management (WEGOVY) 0.25 MG/0.5ML SOAJ Inject 0.25 mg into the skin once a week. 2 mL 0   valACYclovir (VALTREX) 500 MG tablet Take 1 tablet (500 mg total) by mouth 2 (two) times daily for 3 days as needed. 30 tablet 5   venlafaxine XR (EFFEXOR-XR) 150 MG 24 hr capsule Take 1 capsule (150 mg total) by mouth daily. 90 capsule 0   Vitamin D, Ergocalciferol, (DRISDOL) 1.25 MG (50000 UNIT) CAPS capsule Take 1 capsule (50,000 Units total) by mouth every 14 (fourteen) days. 6 capsule 0   [DISCONTINUED] clonazePAM (KLONOPIN) 0.5 MG tablet Take 1 tablet (0.5 mg total) by mouth 2 (two) times daily. 20 tablet 0   [DISCONTINUED] norethindrone (MICRONOR) 0.35 MG tablet Take 1 tablet (0.35 mg total) by mouth daily. 84 tablet 3   No current facility-administered medications on file prior to visit.    BP (!) 128/92   Pulse 81   Temp 98 F (36.7 C)   Resp 18   Ht 5\' 1"  (1.549 m)   Wt 228 lb (103.4 kg)   SpO2 100%   BMI 43.08 kg/m        Objective:   Physical Exam  General Mental Status- Alert. General Appearance- Not in acute distress.   Skin General: Color- Normal Color. Moisture- Normal Moisture.  Neck No JVD. No neck stiffness but trapezius muscle mild tender to palpation bilaterally.  Chest and Lung Exam Auscultation: Breath Sounds:-CTA  Cardiovascular Auscultation:Rythm- RRR Murmurs & Other Heart Sounds:Auscultation of the heart reveals- No Murmurs.  Neurologic Cranial Nerve exam:- CN III-XII intact(No nystagmus), symmetric smile. Drift Test:- No drift. Finger to Nose:- Normal/Intact Strength:- 5/5 equal and symmetric strength both upper and lower extremities.       Assessment & Plan:   Patient Instructions   Hypertension Hypertension persists with systolic 130-140 and diastolic over 90. Discussed amlodipine 2.5  mg daily. Last echo normal. - Prescribe amlodipine 2.5 mg daily. - Discontinue NuvaRing per gyn instruction. - Check blood pressure daily for 7 days and update via MyChart. - Recommend low salt and low caffeine diet. - Consider increasing amlodipine to 5 mg if hypertension persists after 7 days.  Tension Headache Persistent headache likely tension-related. Advised Tylenol over NSAIDs due to blood pressure concerns. - Switch to Tylenol instead of NSAIDs for headache. - Prescribe Flexeril 5 mg, 3 tablets, as needed for tension headache. - Update on headache improvement with Tylenol and Flexeril.  Anxiety Anxiety managed with Buspar and Effexor. Stress may affect blood pressure. - Continue Buspar and Effexor. - Monitor for anxiety-related symptoms affecting blood pressure.  General Health Maintenance Advised regular blood pressure monitoring and healthy lifestyle. - Purchase a home blood pressure monitor. - Adopt lifestyle modifications including low salt and low caffeine diet.  Follow-up Discussed follow-up with gynecologist. - Follow up with gynecologist next month. - Update our office with blood pressure readings in 7 days. - Consider follow-up appointment in 2-3 weeks if needed.(will update your pcp on bp readings/plan)   Esperanza Richters, PA-C   Time spent with patient today was 40  minutes which consisted of chart revdiew, discussing diagnosis, work up treatment and documentation.

## 2023-10-20 ENCOUNTER — Encounter: Payer: Self-pay | Admitting: Family Medicine

## 2023-10-20 ENCOUNTER — Other Ambulatory Visit (HOSPITAL_COMMUNITY): Payer: Self-pay

## 2023-10-20 ENCOUNTER — Ambulatory Visit (INDEPENDENT_AMBULATORY_CARE_PROVIDER_SITE_OTHER): Payer: Commercial Managed Care - PPO | Admitting: Family Medicine

## 2023-10-20 VITALS — BP 132/90 | HR 87 | Temp 98.1°F | Ht 61.0 in | Wt 221.0 lb

## 2023-10-20 DIAGNOSIS — K912 Postsurgical malabsorption, not elsewhere classified: Secondary | ICD-10-CM

## 2023-10-20 DIAGNOSIS — I10 Essential (primary) hypertension: Secondary | ICD-10-CM | POA: Diagnosis not present

## 2023-10-20 DIAGNOSIS — E66813 Obesity, class 3: Secondary | ICD-10-CM | POA: Diagnosis not present

## 2023-10-20 DIAGNOSIS — Z6841 Body Mass Index (BMI) 40.0 and over, adult: Secondary | ICD-10-CM | POA: Diagnosis not present

## 2023-10-20 DIAGNOSIS — Z903 Acquired absence of stomach [part of]: Secondary | ICD-10-CM | POA: Diagnosis not present

## 2023-10-20 DIAGNOSIS — Z9884 Bariatric surgery status: Secondary | ICD-10-CM | POA: Diagnosis not present

## 2023-10-20 DIAGNOSIS — R635 Abnormal weight gain: Secondary | ICD-10-CM

## 2023-10-20 MED ORDER — WEGOVY 0.5 MG/0.5ML ~~LOC~~ SOAJ
0.5000 mg | SUBCUTANEOUS | 0 refills | Status: DC
  Filled 2023-10-20: qty 2, 28d supply, fill #0

## 2023-10-20 NOTE — Progress Notes (Signed)
 Office: [phone]  /  Fax: [phone]  WEIGHT SUMMARY AND BIOMETRICS  Starting Date: 09/13/22  Starting Weight: 229lb   Weight Lost Since Last Visit: 3lb   Vitals Temp: 98.1 F (36.7 C) BP: (!) 132/90 Pulse Rate: 87 SpO2: 98 %   Body Composition  Body Fat %: 48.9 % Fat Mass (lbs): 108.4 lbs Muscle Mass (lbs): 107.6 lbs Total Body Water (lbs): 74.6 lbs Visceral Fat Rating : 13     HPI  Chief Complaint: OBESITY  Natasha Rice is here to discuss her progress with her obesity treatment plan. She is on the the Category 3 Plan and states she is following her eating plan approximately 75 % of the time. She states she is exercising 30 minutes 5 times per week.   Interval History:  Since last office visit she is down 3 lb This gives her a net weight loss of 8 lb in 1 year (had a surrogate baby May 2024) She has done 3 injections of Wegovy 0.25 mg weekly, a change from Victoza 1.2 mg daily She has been a bit hungrier with snacks and sweets She is taking a bariatric MVI daily She is working on sleep, meal planning, mindful eating and protein intake Energy level OK-- cutting out caffeine due to tachycardia and HTN She is walking 30 min during lunch break  Pharmacotherapy: Wegovy 0.25 mg weekly  PHYSICAL EXAM:  Blood pressure (!) 132/90, pulse 87, temperature 98.1 F (36.7 C), height 5\' 1"  (1.549 m), weight 221 lb (100.2 kg), SpO2 98%, unknown if currently breastfeeding. Body mass index is 41.76 kg/m.  General: She is overweight, cooperative, alert, well developed, and in no acute distress. PSYCH: Has normal mood, affect and thought process.   Lungs: Normal breathing effort, no conversational dyspnea.   ASSESSMENT AND PLAN  TREATMENT PLAN FOR OBESITY:  Recommended Dietary Goals  Natasha Rice is currently in the action stage of change. As such, her goal is to continue weight management plan. She has agreed to the Category 3 Plan.  Behavioral Intervention  We  discussed the following Behavioral Modification Strategies today: increasing lean protein intake to established goals, increasing fiber rich foods, increasing water intake , work on meal planning and preparation, keeping healthy foods at home, decreasing eating out or consumption of processed foods, and making healthy choices when eating convenient foods, practice mindfulness eating and understand the difference between hunger signals and cravings, work on managing stress, creating time for self-care and relaxation, avoiding temptations and identifying enticing environmental cues, planning for success, and continue to work on maintaining a reduced calorie state, getting the recommended amount of protein, incorporating whole foods, making healthy choices, staying well hydrated and practicing mindfulness when eating..  Additional resources provided today: NA  Recommended Physical Activity Goals  Natasha Rice has been advised to work up to 150 minutes of moderate intensity aerobic activity a week and strengthening exercises 2-3 times per week for cardiovascular health, weight loss maintenance and preservation of muscle mass.   She has agreed to Increase the intensity, frequency or duration of aerobic exercises    Pharmacotherapy changes for the treatment of obesity: increase Wegovy to 0.5 mg weekly  ASSOCIATED CONDITIONS ADDRESSED TODAY  Hypertension, unspecified type DBP is elevated at 90 today.  She reports poor sleep last night but good compliance with use of Norvasc 2.5 mg daily, Procardia XL 30 mg daily.  She is working on reducing caffeine intake.  Continue all prescribed anti hypertensive agents Continue to work on weight reduction  Class 3 severe obesity due to excess calories with body mass index (BMI) of 40.0 to 44.9 in adult, unspecified whether serious comorbidity present (HCC) -     GNFAOZ; Inject 0.5 mg into the skin once a week.  Dispense: 2 mL; Refill: 0  Weight gain following gastric  bypass surgery Improving and up from nadir weight of 154 lb Struggled more since surrogate pregnancy but is motivated and in the right mindset to continue to work on healthy lifestyle changes Has improved satiety and cravings on Wegovy  Continue 4-5 small meals per day, drinking water outside of meals and continue a bariatric MVI daily Aim for a good 100 g of dietary protein daily  Intestinal malabsorption following gastrectomy She has required IV iron and levels have much improved She is on a bariatric MVI daily and RX vitamin D q 14 days Energy level improving Bariatric labs are UTD     She was informed of the importance of frequent follow up visits to maximize her success with intensive lifestyle modifications for her multiple health conditions.   ATTESTASTION STATEMENTS:  Reviewed by clinician on day of visit: allergies, medications, problem list, medical history, surgical history, family history, social history, and previous encounter notes pertinent to obesity diagnosis.   I have personally spent 30 minutes total time today in preparation, patient care, nutritional counseling and documentation for this visit, including the following: review of clinical lab tests; review of medical tests/procedures/services.      Glennis Brink, DO DABFM, DABOM [facility] [address] [place], [place] [address] [phone]

## 2023-10-26 ENCOUNTER — Other Ambulatory Visit (HOSPITAL_COMMUNITY): Payer: Self-pay

## 2023-10-26 ENCOUNTER — Telehealth: Admitting: Physician Assistant

## 2023-10-26 DIAGNOSIS — L02219 Cutaneous abscess of trunk, unspecified: Secondary | ICD-10-CM

## 2023-10-26 DIAGNOSIS — L03319 Cellulitis of trunk, unspecified: Secondary | ICD-10-CM | POA: Diagnosis not present

## 2023-10-26 MED ORDER — DOXYCYCLINE HYCLATE 100 MG PO TABS
100.0000 mg | ORAL_TABLET | Freq: Two times a day (BID) | ORAL | 0 refills | Status: DC
  Filled 2023-10-26: qty 14, 7d supply, fill #0

## 2023-10-26 NOTE — Progress Notes (Signed)
E Visit for Cellulitis  We are sorry that you are not feeling well. Here is how we plan to help!  Based on what you shared with me it looks like you have cellulitis.  Cellulitis looks like areas of skin redness, swelling, and warmth; it develops as a result of bacteria entering under the skin. Little red spots and/or bleeding can be seen in skin, and tiny surface sacs containing fluid can occur. Fever can be present. Cellulitis is almost always on one side of a body, and the lower limbs are the most common site of involvement.   I have prescribed:  Doxycycline 100 mg twice daily for 7 days.   HOME CARE:  Take your medications as ordered and take all of them, even if the skin irritation appears to be healing.   GET HELP RIGHT AWAY IF:  Symptoms that don't begin to go away within 48 hours. Severe redness persists or worsens If the area turns color, spreads or swells. If it blisters and opens, develops yellow-brown crust or bleeds. You develop a fever or chills. If the pain increases or becomes unbearable.  Are unable to keep fluids and food down.  MAKE SURE YOU   Understand these instructions. Will watch your condition. Will get help right away if you are not doing well or get worse.  Thank you for choosing an e-visit.  Your e-visit answers were reviewed by a board certified advanced clinical practitioner to complete your personal care plan. Depending upon the condition, your plan could have included both over the counter or prescription medications.  Please review your pharmacy choice. Make sure the pharmacy is open so you can pick up prescription now. If there is a problem, you may contact your provider through [employer] and have the prescription routed to another pharmacy.  Your safety is important to [place]. If you have drug allergies check your prescription carefully.   For the next 24 hours you can use MyChart to ask questions about today's visit, request a non-urgent call  back, or ask for a work or school excuse. You will get an email in the next two days asking about your experience. I hope that your e-visit has been valuable and will speed your recovery.

## 2023-10-26 NOTE — Progress Notes (Signed)
 I have spent 5 minutes in review of e-visit questionnaire, review and updating patient chart, medical decision making and response to patient.   Piedad Climes, PA-C

## 2023-10-27 ENCOUNTER — Encounter: Payer: Self-pay | Admitting: Medical

## 2023-10-27 ENCOUNTER — Other Ambulatory Visit (HOSPITAL_COMMUNITY): Payer: Self-pay

## 2023-10-27 DIAGNOSIS — F4323 Adjustment disorder with mixed anxiety and depressed mood: Secondary | ICD-10-CM | POA: Diagnosis not present

## 2023-10-27 MED ORDER — VENLAFAXINE HCL ER 150 MG PO CP24
150.0000 mg | ORAL_CAPSULE | Freq: Every day | ORAL | 0 refills | Status: DC
  Filled 2023-10-27 – 2024-01-13 (×2): qty 90, 90d supply, fill #0

## 2023-10-27 MED ORDER — BUSPIRONE HCL 10 MG PO TABS
10.0000 mg | ORAL_TABLET | Freq: Two times a day (BID) | ORAL | 0 refills | Status: DC
  Filled 2023-10-27: qty 180, 90d supply, fill #0
  Filled 2023-11-22: qty 60, 30d supply, fill #0
  Filled 2023-12-26: qty 60, 30d supply, fill #1
  Filled 2024-01-24: qty 60, 30d supply, fill #2

## 2023-10-27 MED ORDER — CLONAZEPAM 0.5 MG PO TABS
0.5000 mg | ORAL_TABLET | Freq: Two times a day (BID) | ORAL | 0 refills | Status: DC | PRN
Start: 2023-10-27 — End: 2024-04-23
  Filled 2023-10-27: qty 10, 5d supply, fill #0

## 2023-11-02 ENCOUNTER — Other Ambulatory Visit: Payer: Self-pay | Admitting: Medical

## 2023-11-02 ENCOUNTER — Other Ambulatory Visit: Payer: Self-pay

## 2023-11-02 ENCOUNTER — Other Ambulatory Visit (HOSPITAL_COMMUNITY): Payer: Self-pay

## 2023-11-02 MED ORDER — AMLODIPINE BESYLATE 2.5 MG PO TABS
2.5000 mg | ORAL_TABLET | Freq: Every day | ORAL | 0 refills | Status: DC
  Filled 2023-11-02 – 2023-11-12 (×4): qty 30, 30d supply, fill #0

## 2023-11-06 ENCOUNTER — Telehealth: Admitting: Physician Assistant

## 2023-11-06 DIAGNOSIS — M545 Low back pain, unspecified: Secondary | ICD-10-CM

## 2023-11-07 ENCOUNTER — Other Ambulatory Visit (HOSPITAL_COMMUNITY): Payer: Self-pay

## 2023-11-07 MED ORDER — NAPROXEN 500 MG PO TABS
500.0000 mg | ORAL_TABLET | Freq: Two times a day (BID) | ORAL | 0 refills | Status: DC
  Filled 2023-11-07: qty 30, 15d supply, fill #0

## 2023-11-07 MED ORDER — CYCLOBENZAPRINE HCL 10 MG PO TABS
5.0000 mg | ORAL_TABLET | Freq: Three times a day (TID) | ORAL | 0 refills | Status: DC | PRN
  Filled 2023-11-07: qty 30, 10d supply, fill #0

## 2023-11-07 NOTE — Progress Notes (Signed)

## 2023-11-10 ENCOUNTER — Other Ambulatory Visit (HOSPITAL_COMMUNITY): Payer: Self-pay

## 2023-11-12 ENCOUNTER — Other Ambulatory Visit (HOSPITAL_BASED_OUTPATIENT_CLINIC_OR_DEPARTMENT_OTHER): Payer: Self-pay

## 2023-11-12 ENCOUNTER — Other Ambulatory Visit (HOSPITAL_COMMUNITY): Payer: Self-pay

## 2023-11-13 ENCOUNTER — Encounter: Payer: Self-pay | Admitting: Family Medicine

## 2023-11-14 ENCOUNTER — Other Ambulatory Visit (HOSPITAL_COMMUNITY): Payer: Self-pay

## 2023-11-16 DIAGNOSIS — R102 Pelvic and perineal pain: Secondary | ICD-10-CM | POA: Diagnosis not present

## 2023-11-16 DIAGNOSIS — R1030 Lower abdominal pain, unspecified: Secondary | ICD-10-CM | POA: Diagnosis not present

## 2023-11-17 ENCOUNTER — Ambulatory Visit: Admitting: Family Medicine

## 2023-11-17 ENCOUNTER — Encounter: Payer: Self-pay | Admitting: Family Medicine

## 2023-11-17 ENCOUNTER — Ambulatory Visit: Attending: Family Medicine

## 2023-11-17 VITALS — BP 130/86 | HR 90 | Ht 61.0 in | Wt 230.0 lb

## 2023-11-17 DIAGNOSIS — Z8249 Family history of ischemic heart disease and other diseases of the circulatory system: Secondary | ICD-10-CM

## 2023-11-17 DIAGNOSIS — R002 Palpitations: Secondary | ICD-10-CM

## 2023-11-17 DIAGNOSIS — R0789 Other chest pain: Secondary | ICD-10-CM | POA: Diagnosis not present

## 2023-11-17 LAB — EKG 12-LEAD

## 2023-11-17 NOTE — Progress Notes (Signed)
 Acute Office Visit  Subjective:     Patient ID: Natasha Rice, female    DOB: [birth year], 38 y.o.   MRN: [id number]  Chief Complaint  Patient presents with   Palpitations     Patient is in today for palpitations.  Discussed the use of AI scribe software for clinical note transcription with the patient, who gave verbal consent to proceed.  History of Present Illness Natasha Rice is a 38 year old female who presents with episodes of palpitations and irregular heartbeats.  She has been experiencing episodes of palpitations and irregular heartbeats over the past month, with increased frequency in the last two weeks. These episodes are characterized by a racing heart, with the fastest recorded heart rate being 129 beats per minute. The episodes occur spontaneously, without a clear pattern, and last for about 5 to 8 minutes.  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 This was the only instance of sharp pain, and there was no radiation of the pain or associated nausea. No unusual shortness of breath, chest pain, or swelling in her feet, except for the one episode of sharp chest pain.  She has been monitoring her blood pressure and heart rate, noting a heart rate of 117 beats per minute while sitting. Her home EKG readings have shown irregular heartbeat and tachycardia. She is currently taking amlodipine  for blood pressure management, which has improved her readings from 136/106 mmHg to 130/86 mmHg. She also takes Buspar  10 mg twice daily and Effexor  150 mg for anxiety, which she feels is well-controlled.  Her family history is significant for her sister's death at age 23 due to an aortic dissection, which prompted her to undergo an echocardiogram in 2021 that showed normal results, including a normal ejection fraction and no significant valvular  abnormalities.  In terms of lifestyle, she has reduced her caffeine intake and uses an air fryer for healthier cooking. 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       All review of systems negative except what is listed in the HPI      Objective:    BP 130/86   Pulse 90   Ht 5\' 1"  (1.549 m)   Wt 230 lb (104.3 kg)   SpO2 100%   BMI 43.46 kg/m    Physical Exam Vitals reviewed.  Constitutional:      General: She is not in acute distress.    Appearance: Normal appearance. She is not ill-appearing.  Cardiovascular:     Rate and Rhythm: Normal rate and regular rhythm.     Heart sounds: Normal heart sounds.     Comments: Occasional early beat  Pulmonary:     Effort: Pulmonary effort is normal.     Breath sounds: Normal breath sounds.  Skin:    General: Skin is warm and dry.  Neurological:     Mental Status: She is alert and oriented to person, place, and time.  Psychiatric:        Mood and Affect: Mood normal.        Behavior: Behavior normal.        Thought Content: Thought content normal.        Judgment: Judgment normal.        Results for orders placed or performed in visit on 11/17/23  EKG 12-Lead  Result Value Ref Range  EKG 12 lead     EKG NSR 82 bpm     Assessment & Plan:   Problem List Items Addressed This Visit   None Visit Diagnoses       Palpitations    -  Primary   Relevant Orders   EKG 12-Lead (Completed)   CBC with Differential/Platelet   Comprehensive metabolic panel with GFR   B12 and Folate Panel   TSH   LONG TERM MONITOR (3-14 DAYS)   Ambulatory referral to Cardiology   ECHOCARDIOGRAM COMPLETE   VAS US  AAA DUPLEX     Family history of aortic dissection       Relevant Orders   CBC with Differential/Platelet   Comprehensive metabolic panel with GFR   B12 and Folate Panel   TSH   LONG TERM MONITOR (3-14 DAYS)   Ambulatory referral to Cardiology   ECHOCARDIOGRAM COMPLETE    VAS US  AAA DUPLEX     Chest discomfort       Relevant Orders   LONG TERM MONITOR (3-14 DAYS)   Ambulatory referral to Cardiology   ECHOCARDIOGRAM COMPLETE   VAS US  AAA DUPLEX       Assessment & Plan Palpitations Intermittent palpitations with episodes of tachycardia. One episode of chest discomfort. Concern due to family history of aortic dissection - sister died at age 75. - Order two-week cardiac monitor. - Refer to cardiologist. - Repeat echocardiogram and aortic ultrasound compare to 2021 records. - Perform lab work  - Patient aware of signs/symptoms requiring further/urgent evaluation.  - Advise adequate sleep and hydration, stress management, minimize caffeine   Hypertension Blood pressure well-controlled with amlodipine . Recent readings stable. - Continue amlodipine  as prescribed.       No orders of the defined types were placed in this encounter.   Return if symptoms worsen or fail to improve, for / pending results .  Everlina Hock, NP

## 2023-11-17 NOTE — Progress Notes (Unsigned)
 EP to read.

## 2023-11-18 ENCOUNTER — Encounter: Payer: Self-pay | Admitting: Family Medicine

## 2023-11-18 LAB — CBC WITH DIFFERENTIAL/PLATELET
Basophils Absolute: 0.1 10*3/uL (ref 0.0–0.1)
Basophils Relative: 1.1 % (ref 0.0–3.0)
Eosinophils Absolute: 0.1 10*3/uL (ref 0.0–0.7)
Eosinophils Relative: 1.3 % (ref 0.0–5.0)
HCT: 40.9 % (ref 36.0–46.0)
Hemoglobin: 12.7 g/dL (ref 12.0–15.0)
Lymphocytes Relative: 33.9 % (ref 12.0–46.0)
Lymphs Abs: 2.1 10*3/uL (ref 0.7–4.0)
MCHC: 31 g/dL (ref 30.0–36.0)
MCV: 73.6 fl — ABNORMAL LOW (ref 78.0–100.0)
Monocytes Absolute: 0.4 10*3/uL (ref 0.1–1.0)
Monocytes Relative: 7.1 % (ref 3.0–12.0)
Neutro Abs: 3.5 10*3/uL (ref 1.4–7.7)
Neutrophils Relative %: 56.6 % (ref 43.0–77.0)
Platelets: 260 10*3/uL (ref 150.0–400.0)
RBC: 5.55 Mil/uL — ABNORMAL HIGH (ref 3.87–5.11)
RDW: 14.6 % (ref 11.5–15.5)
WBC: 6.1 10*3/uL (ref 4.0–10.5)

## 2023-11-18 LAB — COMPREHENSIVE METABOLIC PANEL WITH GFR
ALT: 10 U/L (ref 0–35)
AST: 13 U/L (ref 0–37)
Albumin: 4 g/dL (ref 3.5–5.2)
Alkaline Phosphatase: 76 U/L (ref 39–117)
BUN: 13 mg/dL (ref 6–23)
CO2: 28 meq/L (ref 19–32)
Calcium: 9.1 mg/dL (ref 8.4–10.5)
Chloride: 104 meq/L (ref 96–112)
Creatinine, Ser: 0.68 mg/dL (ref 0.40–1.20)
GFR: 111.07 mL/min (ref >60.00)
Glucose, Bld: 92 mg/dL (ref 70–99)
Potassium: 4.7 meq/L (ref 3.5–5.1)
Sodium: 139 meq/L (ref 135–145)
Total Bilirubin: 0.3 mg/dL (ref 0.2–1.2)
Total Protein: 6.6 g/dL (ref 6.0–8.3)

## 2023-11-18 LAB — B12 AND FOLATE PANEL
Folate: 13.3 ng/mL (ref >5.9)
Vitamin B-12: 338 pg/mL (ref 211–911)

## 2023-11-18 LAB — TSH: TSH: 0.42 u[IU]/mL (ref 0.35–5.50)

## 2023-11-21 ENCOUNTER — Encounter: Payer: Self-pay | Admitting: Family Medicine

## 2023-11-21 ENCOUNTER — Other Ambulatory Visit (HOSPITAL_COMMUNITY): Payer: Self-pay

## 2023-11-21 ENCOUNTER — Ambulatory Visit (INDEPENDENT_AMBULATORY_CARE_PROVIDER_SITE_OTHER): Admitting: Family Medicine

## 2023-11-21 VITALS — BP 120/84 | HR 85 | Temp 98.0°F | Ht 61.0 in | Wt 226.0 lb

## 2023-11-21 DIAGNOSIS — I10 Essential (primary) hypertension: Secondary | ICD-10-CM

## 2023-11-21 DIAGNOSIS — Z903 Acquired absence of stomach [part of]: Secondary | ICD-10-CM | POA: Diagnosis not present

## 2023-11-21 DIAGNOSIS — E559 Vitamin D deficiency, unspecified: Secondary | ICD-10-CM | POA: Diagnosis not present

## 2023-11-21 DIAGNOSIS — Z6841 Body Mass Index (BMI) 40.0 and over, adult: Secondary | ICD-10-CM | POA: Diagnosis not present

## 2023-11-21 DIAGNOSIS — K912 Postsurgical malabsorption, not elsewhere classified: Secondary | ICD-10-CM | POA: Diagnosis not present

## 2023-11-21 DIAGNOSIS — E66813 Obesity, class 3: Secondary | ICD-10-CM | POA: Diagnosis not present

## 2023-11-21 MED ORDER — WEGOVY 1 MG/0.5ML ~~LOC~~ SOAJ
1.0000 mg | SUBCUTANEOUS | 0 refills | Status: DC
Start: 2023-11-21 — End: 2023-12-26
  Filled 2023-11-21: qty 2, 28d supply, fill #0

## 2023-11-21 MED ORDER — VITAMIN D (ERGOCALCIFEROL) 1.25 MG (50000 UNIT) PO CAPS
50000.0000 [IU] | ORAL_CAPSULE | ORAL | 0 refills | Status: DC
  Filled 2023-11-21 – 2023-12-10 (×2): qty 6, 84d supply, fill #0

## 2023-11-21 NOTE — Progress Notes (Signed)
 Office: [phone]  /  Fax: [phone]  WEIGHT SUMMARY AND BIOMETRICS  Starting Date: 09/13/22  Starting Weight: 229lb   Weight Lost Since Last Visit: 0lb   Vitals Temp: 98 F (36.7 C) BP: 120/84 Pulse Rate: 85 SpO2: 99 %   Body Composition  Body Fat %: 51 % Fat Mass (lbs): 115.6 lbs Muscle Mass (lbs): 105.6 lbs Total Body Water (lbs): 79.4 lbs Visceral Fat Rating : 14   HPI  Chief Complaint: OBESITY  Natasha Rice is here to discuss her progress with her obesity treatment plan. She is on the the Category 3 Plan and states she is following her eating plan approximately 40 % of the time. She states she is exercising 30 minutes 2 times per week.   Interval History:  Since last office visit she is up 5 pounds This gives her a net weight loss of 3 pounds in the past 13 months of medically supervised weight management She is postpartum 11 months, surrogate pregnancy She did go up on her Wegovy  to 0.5 mg once weekly injection Previous failed to see weight loss on Victoza  She has been in therapy and working on improving sleep hygiene In the treatment of her postop regain status post vertical sleeve gastrectomy in 2017 with a nadir weight of 154 pounds Stress levels have been high at work with a move to new office  Satiety is improved on Wegovy  ramping dose without adverse SE She plans to add on more walking  Pharmacotherapy: Wegovy  0.5 mg   PHYSICAL EXAM:  Blood pressure 120/84, pulse 85, temperature 98 F (36.7 C), height 5\' 1"  (1.549 m), weight 226 lb (102.5 kg), SpO2 99%. Body mass index is 42.7 kg/m.  General: She is overweight, cooperative, alert, well developed, and in no acute distress. PSYCH: Has normal mood, affect and thought process.   Lungs: Normal breathing effort, no conversational dyspnea.   ASSESSMENT AND PLAN  TREATMENT PLAN FOR OBESITY:  Recommended Dietary Goals  Natasha Rice is currently in the action stage of change. As such, her goal is  to continue weight management plan. She has agreed to the Category 3 Plan.  Behavioral Intervention  We discussed the following Behavioral Modification Strategies today: increasing lean protein intake to established goals, increasing fiber rich foods, increasing water intake , work on meal planning and preparation, keeping healthy foods at home, practice mindfulness eating and understand the difference between hunger signals and cravings, work on managing stress, creating time for self-care and relaxation, avoiding temptations and identifying enticing environmental cues, continue to practice mindfulness when eating, and continue to work on maintaining a reduced calorie state, getting the recommended amount of protein, incorporating whole foods, making healthy choices, staying well hydrated and practicing mindfulness when eating..  Additional resources provided today: NA  Recommended Physical Activity Goals  Shadiya has been advised to work up to 150 minutes of moderate intensity aerobic activity a week and strengthening exercises 2-3 times per week for cardiovascular health, weight loss maintenance and preservation of muscle mass.   She has agreed to Start aerobic activity with a goal of 150 minutes a week at moderate intensity.   Pharmacotherapy changes for the treatment of obesity:  Wegovy  1 mg  ASSOCIATED CONDITIONS ADDRESSED TODAY  Hypertension, unspecified type Improved.  BP has improved on amlodopine 2.5 mg daily, tolerating well and no longer having headaches.  Avoid stimulant type AOMs.  Continue active plan for weight reduction  Vitamin D  deficiency Last vitamin D  Lab Results  Component Value Date  VD25OH 65.7 09/21/2023  Doing well on RX vitamin D  q 14 days Energy level stable Continue current dose and repeat lab in Aug  -     Vitamin D  (Ergocalciferol ); Take 1 capsule (50,000 Units total) by mouth every 14 (fourteen) days.  Dispense: 6 capsule; Refill: 0  Class 3 severe  obesity due to excess calories with body mass index (BMI) of 40.0 to 44.9 in adult, unspecified whether serious comorbidity present (HCC) -     Wegovy ; Inject 1 mg into the skin once a week.  Dispense: 2 mL; Refill: 0  Intestinal malabsorption following gastrectomy She has been taking a women's MVI daily, RX vitamin D  q 14 days and has had IV iron  with Dr Rosaline Coma done.  She is not needing oral iron  or B12.  Bariatric labs are UTD.         She was informed of the importance of frequent follow up visits to maximize her success with intensive lifestyle modifications for her multiple health conditions.   ATTESTASTION STATEMENTS:  Reviewed by clinician on day of visit: allergies, medications, problem list, medical history, surgical history, family history, social history, and previous encounter notes pertinent to obesity diagnosis.   I have personally spent 30 minutes total time today in preparation, patient care, nutritional counseling and education,  and documentation for this visit, including the following: review of most recent clinical lab tests, prescribing medications/ refilling medications, reviewing medical assistant documentation, review and interpretation of bioimpedence results.     Micky Albee, D.O. DABFM, DABOM [facility] [address] [place], [place] [address] [phone]

## 2023-11-23 ENCOUNTER — Other Ambulatory Visit (HOSPITAL_COMMUNITY): Payer: Self-pay

## 2023-12-08 DIAGNOSIS — F411 Generalized anxiety disorder: Secondary | ICD-10-CM | POA: Diagnosis not present

## 2023-12-09 DIAGNOSIS — R002 Palpitations: Secondary | ICD-10-CM

## 2023-12-09 DIAGNOSIS — R0789 Other chest pain: Secondary | ICD-10-CM

## 2023-12-10 ENCOUNTER — Encounter: Payer: Self-pay | Admitting: Family Medicine

## 2023-12-10 ENCOUNTER — Other Ambulatory Visit: Payer: Self-pay

## 2023-12-10 ENCOUNTER — Other Ambulatory Visit (HOSPITAL_COMMUNITY): Payer: Self-pay

## 2023-12-12 ENCOUNTER — Other Ambulatory Visit (HOSPITAL_COMMUNITY): Payer: Self-pay

## 2023-12-12 ENCOUNTER — Encounter: Payer: Self-pay | Admitting: Hematology and Oncology

## 2023-12-12 ENCOUNTER — Other Ambulatory Visit: Payer: Self-pay | Admitting: Family Medicine

## 2023-12-12 MED ORDER — AMLODIPINE BESYLATE 2.5 MG PO TABS
2.5000 mg | ORAL_TABLET | Freq: Every day | ORAL | 1 refills | Status: DC
  Filled 2023-12-12: qty 90, 90d supply, fill #0

## 2023-12-15 ENCOUNTER — Telehealth: Payer: Self-pay | Admitting: Cardiovascular Disease

## 2023-12-15 NOTE — Telephone Encounter (Signed)
 Patient walked in due to symptoms. Has a new patient apt till September w/ Dr Katheryne Pane. Spoke to triage 12/15/2023. Told pt someone would reach out asap. 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

## 2023-12-15 NOTE — Telephone Encounter (Signed)
 Called and left VM for patient to call back if they would like to get a sooner NP appt in our [facility] office. New patient appt with Dr Katheryne Pane in sept and patient wanted sooner due to symptoms.

## 2023-12-15 NOTE — Telephone Encounter (Signed)
 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 Pt set up for an appt with Dr. Berry Bristol tomorrow 5/23 at 3:20 pm.

## 2023-12-16 ENCOUNTER — Ambulatory Visit: Attending: Cardiology | Admitting: Cardiology

## 2023-12-16 ENCOUNTER — Other Ambulatory Visit (HOSPITAL_COMMUNITY): Payer: Self-pay

## 2023-12-16 ENCOUNTER — Encounter: Payer: Self-pay | Admitting: Cardiology

## 2023-12-16 VITALS — BP 120/96 | HR 77 | Ht 61.5 in | Wt 227.6 lb

## 2023-12-16 DIAGNOSIS — I10 Essential (primary) hypertension: Secondary | ICD-10-CM | POA: Diagnosis not present

## 2023-12-16 DIAGNOSIS — R002 Palpitations: Secondary | ICD-10-CM | POA: Diagnosis not present

## 2023-12-16 MED ORDER — VERAPAMIL HCL ER 180 MG PO TBCR
180.0000 mg | EXTENDED_RELEASE_TABLET | Freq: Every day | ORAL | 3 refills | Status: DC
  Filled 2023-12-16: qty 90, 90d supply, fill #0
  Filled 2024-03-12: qty 90, 90d supply, fill #1

## 2023-12-16 NOTE — Progress Notes (Signed)
 Cardiology Office Note:  .   Date:  12/19/2023  ID:  Natasha Rice, DOB 1985-08-23, MRN [id number] PCP: Everlina Hock, NP  [facility] Providers Cardiologist:  Knox Perl, MD   History of Present Illness: .   ANNETA Rice is a 38 y.o.   Discussed the use of AI scribe software for clinical note transcription with the patient, who gave verbal consent to proceed.  History of Present Illness Natasha Rice is a 38 year old female who presents with palpitations.  She experiences palpitations characterized by a pounding heart sensation, often with lightheadedness. Episodes are more frequent and sometimes triggered by standing up. Consecutive episodes make recovery difficult. A heart monitor initially suggested non-sustained ventricular tachycardia, but this was later ruled out. Despite normal rhythm on the monitor during episodes, symptoms persist. She is on amlodipine  2.5 mg for hypertension and has reduced caffeine intake. She is on Wegovy  for weight loss, resulting in a two to three-pound reduction over four weeks. She does not smoke.  Labs   Lab Results  Component Value Date   CHOL 203 (H) 09/13/2022   HDL 81 09/13/2022   LDLCALC 101 (H) 09/13/2022   TRIG 124 09/13/2022   CHOLHDL 2.5 09/13/2022   Lab Results  Component Value Date   NA 139 11/17/2023   K 4.7 11/17/2023   CO2 28 11/17/2023   GLUCOSE 92 11/17/2023   BUN 13 11/17/2023   CREATININE 0.68 11/17/2023   CALCIUM 9.1 11/17/2023   GFR 111.07 11/17/2023   GFRNONAA >60 08/05/2023      Latest Ref Rng & Units 11/17/2023    3:36 PM 08/05/2023    1:41 PM 07/15/2023    3:48 PM  BMP  Glucose 70 - 99 mg/dL 92  409  811   BUN 6 - 23 mg/dL 13  14  18    Creatinine 0.40 - 1.20 mg/dL 9.14  7.82  9.56   Sodium 135 - 145 mEq/L 139  138  140   Potassium 3.5 - 5.1 mEq/L 4.7  4.5  4.2   Chloride 96 - 112 mEq/L 104  105  108   CO2 19 - 32 mEq/L 28  29  20    Calcium 8.4 - 10.5 mg/dL 9.1  9.1  8.7       Latest  Ref Rng & Units 11/17/2023    3:36 PM 08/05/2023    1:41 PM 07/15/2023    3:48 PM  CBC  WBC 4.0 - 10.5 K/uL 6.1  6.1  7.4   Hemoglobin 12.0 - 15.0 g/dL 21.3  08.6  57.8   Hematocrit 36.0 - 46.0 % 40.9  40.4  37.5   Platelets 150.0 - 400.0 K/uL 260.0  258  244    Lab Results  Component Value Date   HGBA1C 5.5 02/01/2023    Lab Results  Component Value Date   TSH 0.42 11/17/2023    ROS  Review of Systems  Cardiovascular:  Positive for palpitations. Negative for chest pain, dyspnea on exertion and leg swelling.    Physical Exam:   VS:  BP (!) 120/96   Pulse 77   Ht 5' 1.5" (1.562 m)   Wt 227 lb 9.6 oz (103.2 kg)   SpO2 95%   BMI 42.31 kg/m    Wt Readings from Last 3 Encounters:  12/16/23 227 lb 9.6 oz (103.2 kg)  11/21/23 226 lb (102.5 kg)  11/17/23 230 lb (104.3 kg)    Physical Exam Constitutional:  Appearance: She is obese.  Neck:     Vascular: No carotid bruit or JVD.  Cardiovascular:     Rate and Rhythm: Normal rate and regular rhythm.     Pulses: Intact distal pulses.     Heart sounds: Normal heart sounds. No murmur heard.    No gallop.  Pulmonary:     Effort: Pulmonary effort is normal.     Breath sounds: Normal breath sounds.  Abdominal:     General: Bowel sounds are normal.     Palpations: Abdomen is soft.  Musculoskeletal:     Right lower leg: No edema.     Left lower leg: No edema.    Studies Reviewed: Aaron Aas    LONG TERM MONITOR (3-14 DAYS) 12/09/2023 Monitoring period: 12 days   Sinus HR 63 - 169, average 63 bpm.   No atrial fibrillation detected. Rare supraventricular ectopy. Rare ventricular ectopy. No sustained arrhythmias. There were 11 diary entries correlating with normal sinus rhythm.   PAC burden and PVC burden <1%.  External echocardiogram report scanned 07/09/2020: Normal LVEF, 65 to 70%.  No significant valvular abnormality.  Trace tricuspid regurgitation, PASP estimated at 22 mmHg.  EKG:    EKG  Interpretation Date/Time:  Friday Dec 16 2023 15:29:43 EDT Ventricular Rate:  77 PR Interval:  172 QRS Duration:  84 QT Interval:  350 QTC Calculation: 396 R Axis:   40  Text Interpretation: EKG 12/16/2023: Normal sinus rhythm at the rate of 77 bpm, normal EKG.  Compared to 07/15/2023, no change. Confirmed by Nalany Steedley, Jagadeesh ([id number]) on 12/16/2023 3:44:56 PM    Medications and allergies    Allergies  Allergen Reactions   Tape Hives     Current Outpatient Medications:    busPIRone  (BUSPAR ) 10 MG tablet, Take 1 tablet (10 mg total) by mouth 2 (two) times daily., Disp: 180 tablet, Rfl: 0   clonazePAM  (KLONOPIN ) 0.5 MG tablet, Take 1 tablet (0.5 mg total) by mouth 2 (two) times a day as needed for anxiety., Disp: 10 tablet, Rfl: 0   cyclobenzaprine  (FLEXERIL ) 10 MG tablet, Take 0.5-1 tablets (5-10 mg total) by mouth 3 (three) times daily as needed., Disp: 30 tablet, Rfl: 0   fluticasone  (FLONASE ) 50 MCG/ACT nasal spray, Place 2 sprays into both nostrils daily. (Patient taking differently: Place 2 sprays into both nostrils as needed.), Disp: 16 g, Rfl: 0   hydrOXYzine  (VISTARIL ) 25 MG capsule, Take 1 capsule (25 mg total) by mouth every 8 (eight) hours as needed for anxiety. (to help with sleep), Disp: 90 capsule, Rfl: 0   Insulin  Pen Needle (PEN NEEDLES) 31G X 5 MM MISC, Use once daily as directed, Disp: 50 each, Rfl: 1   linaclotide  (LINZESS ) 290 MCG CAPS capsule, Take 1 capsule (290 mcg total) by mouth daily., Disp: 90 capsule, Rfl: 1   loratadine (CLARITIN) 10 MG tablet, Take by mouth as needed., Disp: , Rfl:    naproxen  (NAPROSYN ) 500 MG tablet, Take 1 tablet (500 mg total) by mouth 2 (two) times daily with a meal. (Patient taking differently: Take 500 mg by mouth as needed.), Disp: 30 tablet, Rfl: 0   Semaglutide -Weight Management (WEGOVY ) 1 MG/0.5ML SOAJ, Inject 1 mg into the skin once a week., Disp: 2 mL, Rfl: 0   valACYclovir  (VALTREX ) 500 MG tablet, Take 500 mg by mouth as needed.,  Disp: , Rfl:    venlafaxine  XR (EFFEXOR -XR) 150 MG 24 hr capsule, Take 1 capsule (150 mg total) by mouth daily., Disp: 90 capsule, Rfl:  0   verapamil (CALAN-SR) 180 MG CR tablet, Take 1 tablet (180 mg total) by mouth at bedtime., Disp: 90 tablet, Rfl: 3   Vitamin D , Ergocalciferol , (DRISDOL ) 1.25 MG (50000 UNIT) CAPS capsule, Take 1 capsule (50,000 Units total) by mouth every 14 (fourteen) days., Disp: 6 capsule, Rfl: 0   Meds ordered this encounter  Medications   verapamil (CALAN-SR) 180 MG CR tablet    Sig: Take 1 tablet (180 mg total) by mouth at bedtime.    Dispense:  90 tablet    Refill:  3     Medications Discontinued During This Encounter  Medication Reason   ibuprofen  (ADVIL ) 800 MG tablet Discontinued by provider   valACYclovir  (VALTREX ) 500 MG tablet Discontinued by provider   amLODipine  (NORVASC ) 2.5 MG tablet Discontinued by provider     ASSESSMENT AND PLAN: .      ICD-10-CM   1. Palpitations  R00.2 EKG 12-Lead    2. Primary hypertension  I10 EKG 12-Lead      Assessment and Plan Assessment & Plan Symptomatic palpitations Palpitations are benign with very few PACs and PVCs, presenting with sensations of the heart pounding and lightheadedness, particularly with positional changes. No sustained arrhythmias or NSVT detected. Symptoms likely exacerbated by sleep deprivation and caffeine. Verapamil is preferred over beta blockers to avoid weight gain and fatigue. - Discontinue amlodipine . - Initiate verapamil 180 mg once daily. - Advise to improve sleep quality. - Limit caffeine intake to one cup of coffee per day.  Hypertension Blood pressure remains elevated on amlodipine  2.5 mg. Lifestyle modifications, including weight loss and exercise, are recommended. Verapamil is chosen to manage both hypertension and palpitations without beta blocker side effects. - Discontinue amlodipine . - Initiate verapamil 180 mg once daily. - Encourage weight loss through dietary changes  and exercise. - Advise 15 minutes of daily exercise, such as walking.  Risk of atrial fibrillation Risk factors include obesity and potential sleep apnea and hypertension. No current evidence of atrial fibrillation. Lifestyle changes focus on reducing this risk, emphasizing sleep quality improvement. - Emphasize weight loss and blood pressure control to reduce atrial fibrillation risk. - Advise to improve sleep quality and avoid sleep deprivation.  Obesity Currently on Wegovy  with a weight loss of 2-3 pounds in four weeks. Lifestyle changes, including dietary habits and exercise, are emphasized. Eating slowly and in small portions is advised. - Continue Wegovy  at current dose. - Advise to eat slowly and in small portions. - Encourage regular exercise, aiming for at least 15 minutes of walking daily.   Signed,  Knox Perl, MD, [facility] 12/19/2023, 5:06 PM [facility] [address] [place], [place] [address] Phone: [phone]. Fax:  [phone]

## 2023-12-16 NOTE — Patient Instructions (Addendum)
 Medication Instructions:  Your physician has recommended you make the following change in your medication:   STOP AMLODIPINE  START VERAPAMIL 180 MG DAILY    *If you need a refill on your cardiac medications before your next appointment, please call your pharmacy*  Lab Work: NONE If you have labs (blood work) drawn today and your tests are completely normal, you will receive your results only by: MyChart Message (if you have MyChart) OR A paper copy in the mail If you have any lab test that is abnormal or we need to change your treatment, we will call you to review the results.  Testing/Procedures: NONE  Follow-Up: At [facility], you and your health needs are our priority.  As part of our continuing mission to provide you with exceptional heart care, our providers are all part of one team.  This team includes your primary Cardiologist (physician) and Advanced Practice Providers or APPs (Physician Assistants and Nurse Practitioners) who all work together to provide you with the care you need, when you need it.  Your next appointment:   AS NEEDED   We recommend signing up for the patient portal called "MyChart".  Sign up information is provided on this After Visit Summary.  MyChart is used to connect with patients for Virtual Visits (Telemedicine).  Patients are able to view lab/test results, encounter notes, upcoming appointments, etc.  Non-urgent messages can be sent to your provider as well.   To learn more about what you can do with MyChart, go to [email or web address].

## 2023-12-26 ENCOUNTER — Other Ambulatory Visit (HOSPITAL_COMMUNITY): Payer: Self-pay

## 2023-12-26 ENCOUNTER — Other Ambulatory Visit: Payer: Self-pay

## 2023-12-26 ENCOUNTER — Encounter: Payer: Self-pay | Admitting: Family Medicine

## 2023-12-26 ENCOUNTER — Ambulatory Visit (INDEPENDENT_AMBULATORY_CARE_PROVIDER_SITE_OTHER): Admitting: Family Medicine

## 2023-12-26 VITALS — BP 122/88 | HR 76 | Temp 98.4°F | Ht 61.0 in | Wt 221.0 lb

## 2023-12-26 DIAGNOSIS — Z6841 Body Mass Index (BMI) 40.0 and over, adult: Secondary | ICD-10-CM

## 2023-12-26 DIAGNOSIS — Z9884 Bariatric surgery status: Secondary | ICD-10-CM | POA: Diagnosis not present

## 2023-12-26 DIAGNOSIS — F419 Anxiety disorder, unspecified: Secondary | ICD-10-CM | POA: Diagnosis not present

## 2023-12-26 DIAGNOSIS — F32A Depression, unspecified: Secondary | ICD-10-CM

## 2023-12-26 DIAGNOSIS — E66813 Obesity, class 3: Secondary | ICD-10-CM | POA: Diagnosis not present

## 2023-12-26 DIAGNOSIS — E559 Vitamin D deficiency, unspecified: Secondary | ICD-10-CM | POA: Diagnosis not present

## 2023-12-26 MED ORDER — WEGOVY 1.7 MG/0.75ML ~~LOC~~ SOAJ
1.7000 mg | SUBCUTANEOUS | 0 refills | Status: DC
Start: 2023-12-26 — End: 2024-01-23
  Filled 2023-12-26: qty 3, 28d supply, fill #0

## 2023-12-26 MED ORDER — VITAMIN D (ERGOCALCIFEROL) 1.25 MG (50000 UNIT) PO CAPS
50000.0000 [IU] | ORAL_CAPSULE | ORAL | 0 refills | Status: DC
  Filled 2023-12-26: qty 6, 84d supply, fill #0

## 2023-12-26 NOTE — Progress Notes (Signed)
 Office: [phone]  /  Fax: [phone]  WEIGHT SUMMARY AND BIOMETRICS  Starting Date: 09/13/22  Starting Weight: 229lb   Weight Lost Since Last Visit: 5lb   Vitals Temp: 98.4 F (36.9 C) BP: 122/88 Pulse Rate: 76 SpO2: 100 %   Body Composition  Body Fat %: 48.6 % Fat Mass (lbs): 107.6 lbs Muscle Mass (lbs): 108 lbs Total Body Water (lbs): 73.8 lbs Visceral Fat Rating : 13    HPI  Chief Complaint: OBESITY  Natasha Rice is here to discuss her progress with her obesity treatment plan. She is on the the Category 3 Plan and states she is following her eating plan approximately 70 % of the time. She states she is exercising 30-60 minutes 1 times per week.  Interval History:  Since last office visit she is down 5 lb This gives her a net weight loss of 8 lb in 14 mos She is one year post surrogate pregnancy She has a good support system She has a good amount of satiety from Wegovy  1 mg weekly without meal skipping, nausea, vomiting, dyspepsia or constipation She is more focused on getting in small amounts of protein and produce during the day She is in counseling for her mood with a good support system She plans to walk during her daughter's cheer practice She has a smart watch to track steps  Pharmacotherapy: Wegovy  1 mg weekly  PHYSICAL EXAM:  Blood pressure 122/88, pulse 76, temperature 98.4 F (36.9 C), height 5\' 1"  (1.549 m), weight 221 lb (100.2 kg), SpO2 100%. Body mass index is 41.76 kg/m.  General: She is overweight, cooperative, alert, well developed, and in no acute distress. PSYCH: Has normal mood, affect and thought process.   Lungs: Normal breathing effort, no conversational dyspnea.   ASSESSMENT AND PLAN  TREATMENT PLAN FOR OBESITY:  Recommended Dietary Goals  Natasha Rice is currently in the action stage of change. As such, her goal is to continue weight management plan. She has agreed to practicing portion control and making smarter food  choices, such as increasing vegetables and decreasing simple carbohydrates. ~1400 cal/ day/ 100 g of protein daily  Behavioral Intervention  We discussed the following Behavioral Modification Strategies today: increasing lean protein intake to established goals, increasing fiber rich foods, avoiding skipping meals, increasing water intake , work on meal planning and preparation, keeping healthy foods at home, practice mindfulness eating and understand the difference between hunger signals and cravings, work on managing stress, creating time for self-care and relaxation, avoiding temptations and identifying enticing environmental cues, and continue to work on maintaining a reduced calorie state, getting the recommended amount of protein, incorporating whole foods, making healthy choices, staying well hydrated and practicing mindfulness when eating..  Additional resources provided today: NA  Recommended Physical Activity Goals  Natasha Rice has been advised to work up to 150 minutes of moderate intensity aerobic activity a week and strengthening exercises 2-3 times per week for cardiovascular health, weight loss maintenance and preservation of muscle mass.   She has agreed to Increase the intensity, frequency or duration of aerobic exercises   - add in 30+ min of walking daily  Pharmacotherapy changes for the treatment of obesity: increase Wegovy  to 1.7 mg weekly  ASSOCIATED CONDITIONS ADDRESSED TODAY  Vitamin D  deficiency Last vitamin D  Lab Results  Component Value Date   VD25OH 65.7 09/21/2023   Taking RX vitamin D  q 14 days Energy level improving Repeat lab in Aug -     Vitamin D  (Ergocalciferol ); Take  1 capsule (50,000 Units total) by mouth every 14 (fourteen) days.  Dispense: 6 capsule; Refill: 0  Class 3 severe obesity due to excess calories with body mass index (BMI) of 40.0 to 44.9 in adult, unspecified whether serious comorbidity present -     Wegovy ; Inject 1.7 mg into the skin  once a week.  Dispense: 3 mL; Refill: 0  Anxiety and depression Mood improving, in counseling Taking Buspar  10 mg bid, Klonopin  0.5 mg bid prn anxiety, Hydroxyzine  25 mg q 8 hrs prn anxiety, Venlaxafine XR 150 mg daily.  Family and co workers have been supportive.  Work stress has improved  Continue to work on self care, adequate sleep at night, good nutrition and consistent walking  S/P laparoscopic sleeve gastrectomy Has improved satiety with Wegovy  and has been happy to see weight reduction.  Working to get close to her previous nadir weight of 154 lb.  Taking a Bariatric MVI daily as directed.  Denies food aversions or digestion issues  Continue to work on getting in ~100 g of protein daily Remember small meals 4-5 x a day, drinking water outside of mealtime  She was informed of the importance of frequent follow up visits to maximize her success with intensive lifestyle modifications for her multiple health conditions.   ATTESTASTION STATEMENTS:  Reviewed by clinician on day of visit: allergies, medications, problem list, medical history, surgical history, family history, social history, and previous encounter notes pertinent to obesity diagnosis.   I have personally spent 30 minutes total time today in preparation, patient care, nutritional counseling and education,  and documentation for this visit, including the following: review of most recent clinical lab tests, prescribing medications/ refilling medications, reviewing medical assistant documentation, review and interpretation of bioimpedence results.     Micky Albee, D.O. DABFM, DABOM [facility] [address] [place], [place] [address] [phone]

## 2023-12-29 ENCOUNTER — Other Ambulatory Visit (HOSPITAL_COMMUNITY): Payer: Self-pay

## 2023-12-30 ENCOUNTER — Telehealth: Payer: Self-pay

## 2023-12-30 ENCOUNTER — Ambulatory Visit (HOSPITAL_COMMUNITY)
Admission: RE | Admit: 2023-12-30 | Discharge: 2023-12-30 | Disposition: A | Discharge status: Home / Self Care | Source: Ambulatory Visit | Attending: Internal Medicine | Admitting: Internal Medicine

## 2023-12-30 ENCOUNTER — Ambulatory Visit: Payer: Self-pay | Admitting: Family Medicine

## 2023-12-30 DIAGNOSIS — Z8249 Family history of ischemic heart disease and other diseases of the circulatory system: Secondary | ICD-10-CM | POA: Insufficient documentation

## 2023-12-30 DIAGNOSIS — R002 Palpitations: Secondary | ICD-10-CM

## 2023-12-30 DIAGNOSIS — I10 Essential (primary) hypertension: Secondary | ICD-10-CM | POA: Diagnosis not present

## 2023-12-30 DIAGNOSIS — R0789 Other chest pain: Secondary | ICD-10-CM | POA: Insufficient documentation

## 2023-12-30 LAB — ECHOCARDIOGRAM COMPLETE
Area-P 1/2: 4.15 cm2
S' Lateral: 2.7 cm

## 2023-12-30 NOTE — Telephone Encounter (Signed)
 Copied from CRM [id number]. Topic: Clinical - Lab/Test Results >> Dec 30, 2023  2:39 PM Luane Rumps D wrote: Reason for CRM: Patient calling back to go over recent results for her ultrasound.

## 2023-12-30 NOTE — Telephone Encounter (Signed)
 Pt has seen results after calling, see under results tab.

## 2024-01-02 ENCOUNTER — Other Ambulatory Visit (HOSPITAL_COMMUNITY): Payer: Self-pay

## 2024-01-02 ENCOUNTER — Telehealth: Admitting: Physician Assistant

## 2024-01-02 DIAGNOSIS — M545 Low back pain, unspecified: Secondary | ICD-10-CM

## 2024-01-02 MED ORDER — NAPROXEN 500 MG PO TABS
500.0000 mg | ORAL_TABLET | Freq: Two times a day (BID) | ORAL | 0 refills | Status: DC
  Filled 2024-01-02: qty 30, 15d supply, fill #0

## 2024-01-02 MED ORDER — CYCLOBENZAPRINE HCL 10 MG PO TABS
5.0000 mg | ORAL_TABLET | Freq: Three times a day (TID) | ORAL | 0 refills | Status: DC | PRN
  Filled 2024-01-02: qty 30, 10d supply, fill #0

## 2024-01-02 NOTE — Progress Notes (Signed)

## 2024-01-13 ENCOUNTER — Other Ambulatory Visit (HOSPITAL_COMMUNITY): Payer: Self-pay

## 2024-01-19 ENCOUNTER — Ambulatory Visit: Admitting: Family Medicine

## 2024-01-23 ENCOUNTER — Other Ambulatory Visit (HOSPITAL_COMMUNITY): Payer: Self-pay

## 2024-01-23 ENCOUNTER — Ambulatory Visit (INDEPENDENT_AMBULATORY_CARE_PROVIDER_SITE_OTHER): Admitting: Family Medicine

## 2024-01-23 VITALS — BP 126/88 | HR 87 | Temp 98.0°F | Ht 61.0 in | Wt 219.0 lb

## 2024-01-23 DIAGNOSIS — F32A Depression, unspecified: Secondary | ICD-10-CM

## 2024-01-23 DIAGNOSIS — Z6841 Body Mass Index (BMI) 40.0 and over, adult: Secondary | ICD-10-CM | POA: Diagnosis not present

## 2024-01-23 DIAGNOSIS — E559 Vitamin D deficiency, unspecified: Secondary | ICD-10-CM | POA: Diagnosis not present

## 2024-01-23 DIAGNOSIS — F419 Anxiety disorder, unspecified: Secondary | ICD-10-CM

## 2024-01-23 DIAGNOSIS — Z9884 Bariatric surgery status: Secondary | ICD-10-CM

## 2024-01-23 DIAGNOSIS — E66813 Obesity, class 3: Secondary | ICD-10-CM

## 2024-01-23 MED ORDER — VITAMIN D (ERGOCALCIFEROL) 1.25 MG (50000 UNIT) PO CAPS
50000.0000 [IU] | ORAL_CAPSULE | ORAL | 0 refills | Status: DC
  Filled 2024-01-23: qty 6, 84d supply, fill #0

## 2024-01-23 MED ORDER — WEGOVY 1.7 MG/0.75ML ~~LOC~~ SOAJ
1.7000 mg | SUBCUTANEOUS | 1 refills | Status: DC
  Filled 2024-01-23: qty 3, 28d supply, fill #0
  Filled 2024-02-09 – 2024-02-13 (×2): qty 3, 28d supply, fill #1

## 2024-01-23 MED ORDER — WEGOVY 1.7 MG/0.75ML ~~LOC~~ SOAJ
1.7000 mg | SUBCUTANEOUS | 0 refills | Status: DC
Start: 2024-01-23 — End: 2024-01-23
  Filled 2024-01-23: qty 3, 28d supply, fill #0

## 2024-01-23 NOTE — Progress Notes (Signed)
 Office: [phone]  /  Fax: [phone]  WEIGHT SUMMARY AND BIOMETRICS  Starting Date: 09/13/22  Starting Weight: 229lb   Weight Lost Since Last Visit: 2lb   Vitals Temp: 98 F (36.7 C) BP: 126/88 Pulse Rate: 87 SpO2: 99 %   Body Composition  Body Fat %: 46.5 % Fat Mass (lbs): 102.2 lbs Muscle Mass (lbs): 111.6 lbs Total Body Water (lbs): 79.4 lbs Visceral Fat Rating : 13    HPI  Chief Complaint: OBESITY  Natasha Rice is here to discuss her progress with her obesity treatment plan. She is on the the Category 3 Plan and states she is following her eating plan approximately 65 % of the time. She states she is walking more daily.  Interval History:  Since last office visit she is down 2 lb She is up 3.6 pounds of muscle and down 5.4 pounds of body fat since last visit This gives her a net weight loss of 10 lb in the past 15 mos She is doing well on Wegovy  1.7 mg weekly She is tolerating this well without GI side effects She is not skipping meals She grabs a protein shake to not skip breakfast some days She did a lot of walking on her cruise trip and she plans to add in gym workouts in the mornings  Pharmacotherapy: Wegovy  1.7 mg weekly  PHYSICAL EXAM:  Blood pressure 126/88, pulse 87, temperature 98 F (36.7 C), height 5' 1 (1.549 m), weight 219 lb (99.3 kg), SpO2 99%. Body mass index is 41.38 kg/m.  General: She is overweight, cooperative, alert, well developed, and in no acute distress. PSYCH: Has normal mood, affect and thought process.   Lungs: Normal breathing effort, no conversational dyspnea.  ASSESSMENT AND PLAN  TREATMENT PLAN FOR OBESITY:  Recommended Dietary Goals  Natasha Rice is currently in the action stage of change. As such, her goal is to continue weight management plan. She has agreed to keeping a food journal and adhering to recommended goals of 1400 calories and 90+ grams of protein and following a lower carbohydrate, vegetable and lean  protein rich diet plan.  Behavioral Intervention  We discussed the following Behavioral Modification Strategies today: increasing lean protein intake to established goals, increasing fiber rich foods, increasing water intake , work on meal planning and preparation, keeping healthy foods at home, practice mindfulness eating and understand the difference between hunger signals and cravings, avoiding temptations and identifying enticing environmental cues, planning for success, and continue to work on maintaining a reduced calorie state, getting the recommended amount of protein, incorporating whole foods, making healthy choices, staying well hydrated and practicing mindfulness when eating..  Additional resources provided today: NA  Recommended Physical Activity Goals  Natasha Rice has been advised to work up to 150 minutes of moderate intensity aerobic activity a week and strengthening exercises 2-3 times per week for cardiovascular health, weight loss maintenance and preservation of muscle mass.   She has agreed to [employer] strengthening exercises with a goal of 2-3 sessions a week  and Start aerobic activity with a goal of 150 minutes a week at moderate intensity.  We set a goal for morning gym workouts at least 3 days a week  Pharmacotherapy changes for the treatment of obesity: None  ASSOCIATED CONDITIONS ADDRESSED TODAY  Anxiety and depression Mood has improved in counseling and currently on BuSpar  10 mg twice daily, and loxapine XR 150 mg daily She has a good support system.  She denies emotional eating.  She has been sleeping  better and her energy level has also improved.  Continue counseling and current medications.  Continue work on self-care and sleep.  Class 3 severe obesity due to excess calories with body mass index (BMI) of 40.0 to 44.9 in adult, unspecified whether serious comorbidity present -     Wegovy ; Inject 1.7 mg into the skin once a week.  Dispense: 3 mL; Refill: 1 She has  adequate satiety and is seeing a good amount of body fat loss on Wegovy  1.7 mg once weekly injection without GI side effects or meal skipping.  She has been able to maintain her lean body mass.  Will keep Wegovy  at 1.7 mg once weekly injection, adding in more consistent exercise to include cardio and resistance training at least 3 days a week.  Vitamin D  deficiency She is taking vitamin D  50,000 IUs q. 14 days Last vitamin D  Lab Results  Component Value Date   VD25OH 65.7 09/21/2023   Recheck labs this fall  -     Vitamin D  (Ergocalciferol ); Take 1 capsule (50,000 Units total) by mouth every 14 (fourteen) days.  Dispense: 6 capsule; Refill: 0  S/P laparoscopic sleeve gastrectomy She is happy to see weight reduction and improvements in food volume and satiety using Wegovy  status post vertical sleeve gastrectomy done November 2017 with a preop weight of 227 pounds and a postop nadir weight of 154 pounds.  She is taking a bariatric multivitamin daily and vitamin D  50,000 IU q. 14 days.  Continue to work on eating 3-4 small meals a day consisting of lean protein and fiber, eliminating ultra processed foods and high sugar foods and drinks.  Continue current supplements.  Bariatric labs are reviewed and up-to-date.  Work on hydrating well with water in between meals     She was informed of the importance of frequent follow up visits to maximize her success with intensive lifestyle modifications for her multiple health conditions.   ATTESTASTION STATEMENTS:  Reviewed by clinician on day of visit: allergies, medications, problem list, medical history, surgical history, family history, social history, and previous encounter notes pertinent to obesity diagnosis.   I have personally spent 30 minutes total time today in preparation, patient care, nutritional counseling and education,  and documentation for this visit, including the following: review of most recent clinical lab tests, prescribing  medications/ refilling medications, reviewing medical assistant documentation, review and interpretation of bioimpedence results.     Darice Haddock, D.O. DABFM, DABOM [facility] [address] [place], [place] [address] [phone]

## 2024-01-24 ENCOUNTER — Other Ambulatory Visit (HOSPITAL_COMMUNITY): Payer: Self-pay

## 2024-01-24 ENCOUNTER — Telehealth: Admitting: Physician Assistant

## 2024-01-24 DIAGNOSIS — L03119 Cellulitis of unspecified part of limb: Secondary | ICD-10-CM | POA: Diagnosis not present

## 2024-01-24 MED ORDER — CEPHALEXIN 500 MG PO CAPS
500.0000 mg | ORAL_CAPSULE | Freq: Four times a day (QID) | ORAL | 0 refills | Status: AC
  Filled 2024-01-24: qty 20, 5d supply, fill #0

## 2024-01-24 NOTE — Progress Notes (Signed)
 I have spent 5 minutes in review of e-visit questionnaire, review and updating patient chart, medical decision making and response to patient.   Piedad Climes, PA-C

## 2024-01-24 NOTE — Progress Notes (Signed)
 E Visit for Cellulitis  We are sorry that you are not feeling well. Here is how we plan to help!  Based on what you shared with me it looks like you have cellulitis.  Cellulitis looks like areas of skin redness, swelling, and warmth; it develops as a result of bacteria entering under the skin. Little red spots and/or bleeding can be seen in skin, and tiny surface sacs containing fluid can occur. Fever can be present. Cellulitis is almost always on one side of a body, and the lower limbs are the most common site of involvement.   I have prescribed:  Keflex 500mg  take one by mouth four times a day for 5 days.  HOME CARE:  . Take your medications as ordered and take all of them, even if the skin irritation appears to be healing.   GET HELP RIGHT AWAY IF:  . Symptoms that don't begin to go away within 48 hours. . Severe redness persists or worsens . If the area turns color, spreads or swells. . If it blisters and opens, develops yellow-brown crust or bleeds. . You develop a fever or chills. . If the pain increases or becomes unbearable.  . Are unable to keep fluids and food down.  MAKE SURE YOU    Understand these instructions.  Will watch your condition.  Will get help right away if you are not doing well or get worse.  Thank you for choosing an e-visit. Your e-visit answers were reviewed by a board certified advanced clinical practitioner to complete your personal care plan. Depending upon the condition, your plan could have included both over the counter or prescription medications. Please review your pharmacy choice. Make sure the pharmacy is open so you can pick up prescription now. If there is a problem, you may contact your provider through [employer] and have the prescription routed to another pharmacy. Your safety is important to [place]. If you have drug allergies check your prescription carefully.  For the next 24 hours you can use MyChart to ask questions about today's  visit, request a non-urgent call back, or ask for a work or school excuse. You will get an email in the next two days asking about your experience. I hope that your e-visit has been valuable and will speed your recovery.

## 2024-01-25 ENCOUNTER — Other Ambulatory Visit (HOSPITAL_COMMUNITY): Payer: Self-pay

## 2024-02-01 ENCOUNTER — Telehealth: Admitting: Physician Assistant

## 2024-02-01 ENCOUNTER — Other Ambulatory Visit: Payer: Self-pay | Admitting: Family Medicine

## 2024-02-01 DIAGNOSIS — L03319 Cellulitis of trunk, unspecified: Secondary | ICD-10-CM

## 2024-02-01 NOTE — Progress Notes (Signed)
  Because you have failed a first line treatment, I feel your condition warrants further evaluation and I recommend that you be seen in a face-to-face visit. You may require having a culture obtained of the lesion to determine the bacterial cause and make sure you are put on the appropriate treatment.    NOTE: There will be NO CHARGE for this E-Visit   If you are having a true medical emergency, please call 911.     For an urgent face to face visit, Natasha Rice has multiple urgent care centers for your convenience.  Click the link below for the full list of locations and hours, walk-in wait times, appointment scheduling options and driving directions:  Urgent Care - [place], [place], [place], [place], [place], [place]  [place]     Your MyChart E-visit questionnaire answers were reviewed by a board certified advanced clinical practitioner to complete your personal care plan based on your specific symptoms.    Thank you for using e-Visits.    I have spent 5 minutes in review of e-visit questionnaire, review and updating patient chart, medical decision making and response to patient.   Natasha Rice CHRISTELLA Dickinson, PA-C

## 2024-02-02 ENCOUNTER — Other Ambulatory Visit (HOSPITAL_COMMUNITY): Payer: Self-pay

## 2024-02-02 ENCOUNTER — Other Ambulatory Visit: Payer: Self-pay | Admitting: Physician Assistant

## 2024-02-02 ENCOUNTER — Other Ambulatory Visit: Payer: Self-pay

## 2024-02-02 DIAGNOSIS — D500 Iron deficiency anemia secondary to blood loss (chronic): Secondary | ICD-10-CM

## 2024-02-02 MED ORDER — LINACLOTIDE 290 MCG PO CAPS
290.0000 ug | ORAL_CAPSULE | Freq: Every day | ORAL | 0 refills | Status: DC
  Filled 2024-02-02: qty 90, 90d supply, fill #0

## 2024-02-03 ENCOUNTER — Inpatient Hospital Stay: Payer: Commercial Managed Care - PPO | Attending: Hematology and Oncology

## 2024-02-03 DIAGNOSIS — Z9089 Acquired absence of other organs: Secondary | ICD-10-CM | POA: Diagnosis not present

## 2024-02-03 DIAGNOSIS — Z833 Family history of diabetes mellitus: Secondary | ICD-10-CM | POA: Insufficient documentation

## 2024-02-03 DIAGNOSIS — Z825 Family history of asthma and other chronic lower respiratory diseases: Secondary | ICD-10-CM | POA: Diagnosis not present

## 2024-02-03 DIAGNOSIS — Z83438 Family history of other disorder of lipoprotein metabolism and other lipidemia: Secondary | ICD-10-CM | POA: Insufficient documentation

## 2024-02-03 DIAGNOSIS — D509 Iron deficiency anemia, unspecified: Secondary | ICD-10-CM | POA: Diagnosis not present

## 2024-02-03 DIAGNOSIS — Z814 Family history of other substance abuse and dependence: Secondary | ICD-10-CM | POA: Insufficient documentation

## 2024-02-03 DIAGNOSIS — F32A Depression, unspecified: Secondary | ICD-10-CM | POA: Insufficient documentation

## 2024-02-03 DIAGNOSIS — Z79899 Other long term (current) drug therapy: Secondary | ICD-10-CM | POA: Insufficient documentation

## 2024-02-03 DIAGNOSIS — Z8261 Family history of arthritis: Secondary | ICD-10-CM | POA: Diagnosis not present

## 2024-02-03 DIAGNOSIS — D563 Thalassemia minor: Secondary | ICD-10-CM | POA: Insufficient documentation

## 2024-02-03 DIAGNOSIS — R5383 Other fatigue: Secondary | ICD-10-CM | POA: Insufficient documentation

## 2024-02-03 DIAGNOSIS — N920 Excessive and frequent menstruation with regular cycle: Secondary | ICD-10-CM | POA: Insufficient documentation

## 2024-02-03 DIAGNOSIS — Z811 Family history of alcohol abuse and dependence: Secondary | ICD-10-CM | POA: Diagnosis not present

## 2024-02-03 DIAGNOSIS — Z9884 Bariatric surgery status: Secondary | ICD-10-CM | POA: Diagnosis not present

## 2024-02-03 DIAGNOSIS — F419 Anxiety disorder, unspecified: Secondary | ICD-10-CM | POA: Insufficient documentation

## 2024-02-03 DIAGNOSIS — Z818 Family history of other mental and behavioral disorders: Secondary | ICD-10-CM | POA: Diagnosis not present

## 2024-02-03 DIAGNOSIS — D500 Iron deficiency anemia secondary to blood loss (chronic): Secondary | ICD-10-CM

## 2024-02-03 DIAGNOSIS — Z8249 Family history of ischemic heart disease and other diseases of the circulatory system: Secondary | ICD-10-CM | POA: Insufficient documentation

## 2024-02-03 DIAGNOSIS — Z8349 Family history of other endocrine, nutritional and metabolic diseases: Secondary | ICD-10-CM | POA: Diagnosis not present

## 2024-02-03 DIAGNOSIS — Z823 Family history of stroke: Secondary | ICD-10-CM | POA: Diagnosis not present

## 2024-02-03 LAB — IRON AND IRON BINDING CAPACITY (CC-WL,HP ONLY)
Iron: 77 ug/dL (ref 28–170)
Saturation Ratios: 27 % (ref 10.4–31.8)
TIBC: 287 ug/dL (ref 250–450)
UIBC: 210 ug/dL (ref 148–442)

## 2024-02-03 LAB — RETIC PANEL
Immature Retic Fract: 8.9 % (ref 2.3–15.9)
RBC.: 5.65 MIL/uL — ABNORMAL HIGH (ref 3.87–5.11)
Retic Count, Absolute: 55.9 K/uL (ref 19.0–186.0)
Retic Ct Pct: 1 % (ref 0.4–3.1)
Reticulocyte Hemoglobin: 26.2 pg — ABNORMAL LOW (ref >27.9)

## 2024-02-03 LAB — CMP (CANCER CENTER ONLY)
ALT: 9 U/L (ref 0–44)
AST: 11 U/L — ABNORMAL LOW (ref 15–41)
Albumin: 3.9 g/dL (ref 3.5–5.0)
Alkaline Phosphatase: 75 U/L (ref 38–126)
Anion gap: 7 (ref 5–15)
BUN: 14 mg/dL (ref 6–20)
CO2: 27 mmol/L (ref 22–32)
Calcium: 9.1 mg/dL (ref 8.9–10.3)
Chloride: 102 mmol/L (ref 98–111)
Creatinine: 0.83 mg/dL (ref 0.44–1.00)
GFR, Estimated: >60 mL/min (ref >60)
Glucose, Bld: 104 mg/dL — ABNORMAL HIGH (ref 70–99)
Potassium: 4.2 mmol/L (ref 3.5–5.1)
Sodium: 136 mmol/L (ref 135–145)
Total Bilirubin: 0.5 mg/dL (ref 0.0–1.2)
Total Protein: 7 g/dL (ref 6.5–8.1)

## 2024-02-03 LAB — CBC WITH DIFFERENTIAL (CANCER CENTER ONLY)
Abs Immature Granulocytes: 0.01 K/uL (ref 0.00–0.07)
Basophils Absolute: 0 K/uL (ref 0.0–0.1)
Basophils Relative: 1 %
Eosinophils Absolute: 0.1 K/uL (ref 0.0–0.5)
Eosinophils Relative: 1 %
HCT: 40.9 % (ref 36.0–46.0)
Hemoglobin: 12.9 g/dL (ref 12.0–15.0)
Immature Granulocytes: 0 %
Lymphocytes Relative: 29 %
Lymphs Abs: 1.3 K/uL (ref 0.7–4.0)
MCH: 22.8 pg — ABNORMAL LOW (ref 26.0–34.0)
MCHC: 31.5 g/dL (ref 30.0–36.0)
MCV: 72.3 fL — ABNORMAL LOW (ref 80.0–100.0)
Monocytes Absolute: 0.3 K/uL (ref 0.1–1.0)
Monocytes Relative: 6 %
Neutro Abs: 2.7 K/uL (ref 1.7–7.7)
Neutrophils Relative %: 63 %
Platelet Count: 255 K/uL (ref 150–400)
RBC: 5.66 MIL/uL — ABNORMAL HIGH (ref 3.87–5.11)
RDW: 14.6 % (ref 11.5–15.5)
WBC Count: 4.4 K/uL (ref 4.0–10.5)
nRBC: 0 % (ref 0.0–0.2)

## 2024-02-03 LAB — FERRITIN: Ferritin: 352 ng/mL — ABNORMAL HIGH (ref 11–307)

## 2024-02-10 ENCOUNTER — Inpatient Hospital Stay: Payer: Commercial Managed Care - PPO | Admitting: Physician Assistant

## 2024-02-10 ENCOUNTER — Other Ambulatory Visit (HOSPITAL_COMMUNITY): Payer: Self-pay

## 2024-02-10 VITALS — BP 102/74 | HR 86 | Temp 97.3°F | Resp 17 | Ht 61.0 in | Wt 224.0 lb

## 2024-02-10 DIAGNOSIS — D500 Iron deficiency anemia secondary to blood loss (chronic): Secondary | ICD-10-CM

## 2024-02-10 DIAGNOSIS — R5383 Other fatigue: Secondary | ICD-10-CM | POA: Diagnosis not present

## 2024-02-10 DIAGNOSIS — F419 Anxiety disorder, unspecified: Secondary | ICD-10-CM | POA: Diagnosis not present

## 2024-02-10 DIAGNOSIS — Z9884 Bariatric surgery status: Secondary | ICD-10-CM | POA: Diagnosis not present

## 2024-02-10 DIAGNOSIS — D509 Iron deficiency anemia, unspecified: Secondary | ICD-10-CM | POA: Diagnosis not present

## 2024-02-10 DIAGNOSIS — N920 Excessive and frequent menstruation with regular cycle: Secondary | ICD-10-CM | POA: Diagnosis not present

## 2024-02-10 DIAGNOSIS — Z9089 Acquired absence of other organs: Secondary | ICD-10-CM | POA: Diagnosis not present

## 2024-02-10 DIAGNOSIS — Z8349 Family history of other endocrine, nutritional and metabolic diseases: Secondary | ICD-10-CM | POA: Diagnosis not present

## 2024-02-10 DIAGNOSIS — Z79899 Other long term (current) drug therapy: Secondary | ICD-10-CM | POA: Diagnosis not present

## 2024-02-10 DIAGNOSIS — D563 Thalassemia minor: Secondary | ICD-10-CM

## 2024-02-10 DIAGNOSIS — F32A Depression, unspecified: Secondary | ICD-10-CM | POA: Diagnosis not present

## 2024-02-10 NOTE — Progress Notes (Signed)
 [facility] [phone]   [phone]  PROGRESS NOTE  Patient Care Team: Almarie Waddell NOVAK, NP as PCP - General (Family Medicine) Ladona Heinz, MD as PCP - Cardiology (Cardiology) Tobie Karole SAILOR., MD (Obstetrics and Gynecology)  Hematological/Oncological History # Iron  Deficiency Anemia in Setting of Pregnancy   07/16/2022: establish care with Dr. Federico. Hgb 11.1, Sat 8% with ferritin of 35.  08/02/2022 to 08/09/2022: received 2 doses of IV feraheme 510 mg 05/24/2023--07/01/2023: Received 5 doses of IV Venofer  200 mg weekly.  Complicated by IV iron  infiltration in the arm.  Interval History:  [place] 38 y.o. female with medical history significant for iron  deficiency anemia in pregnancy who presents for a follow up visit. The patient's last visit was on 08/12/2023. In the interim since the last visit she denies any changes to her health.  On exam today Mrs. Degraff reports having fatigue that is ongoing which can impact her ADLs.  She reports her appetite and weight are overall unchanged.  She denies nausea, vomiting or bowel habit changes.  She does have some cold sensitivity.  She has abnormal menstrual cycles with having to cycles in the last month.  She is under the care of of a gynecologist who is discussing interventions with her including tubal ligation and D&C. she has no other signs of bleeding including hematochezia or melena.  Patient denies fevers, chills, night sweats, shortness of breath, chest pain, cough, headaches, dizziness or neuropathy.  She has no other complaints.  A full 10 point ROS is otherwise negative.  MEDICAL HISTORY:  Past Medical History:  Diagnosis Date   Allergy    Anemia    Anxiety    Constipation    Depression    GERD (gastroesophageal reflux disease)    Hypertension    Polycystic ovarian disease    Seasonal allergies    Sleep apnea    Vitamin D  deficiency     SURGICAL HISTORY: Past Surgical History:   Procedure Laterality Date   CESAREAN SECTION     GASTRIC BYPASS     TONSILLECTOMY      SOCIAL HISTORY: Social History   Socioeconomic History   Marital status: Single    Spouse name: Not on file   Number of children: Not on file   Years of education: Not on file   Highest education level: Associate degree: occupational, Scientist, product/process development, or vocational program  Occupational History   Not on file  Tobacco Use   Smoking status: Never   Smokeless tobacco: Never  Vaping Use   Vaping status: Never Used  Substance and Sexual Activity   Alcohol use: Not Currently    Alcohol/week: 2.0 standard drinks of alcohol    Types: 2 Glasses of wine per week    Comment: Occ   Drug use: No   Sexual activity: Yes    Birth control/protection: Condom  Other Topics Concern   Not on file  Social History Narrative   Not on file   Social Drivers of Health   Financial Resource Strain: Low Risk  (10/19/2023)   Overall Financial Resource Strain (CARDIA)    Difficulty of Paying Living Expenses: Not hard at all  Food Insecurity: No Food Insecurity (10/19/2023)   Hunger Vital Sign    Worried About Running Out of Food in the Last Year: Never true    Ran Out of Food in the Last Year: Never true  Transportation Needs: No Transportation Needs (10/19/2023)   PRAPARE - Transportation  Lack of Transportation (Medical): No    Lack of Transportation (Non-Medical): No  Physical Activity: Insufficiently Active (10/19/2023)   Exercise Vital Sign    Days of Exercise per Week: 2 days    Minutes of Exercise per Session: 30 min  Stress: Stress Concern Present (10/19/2023)   [employer] of Occupational Health - Occupational Stress Questionnaire    Feeling of Stress : To some extent  Social Connections: Moderately Isolated (10/19/2023)   Social Connection and Isolation Panel    Frequency of Communication with Friends and Family: More than three times a week    Frequency of Social Gatherings with Friends and  Family: Once a week    Attends Religious Services: 1 to 4 times per year    Active Member of [employer] or Organizations: No    Attends Engineer, structural: Not on file    Marital Status: Never married  Intimate Partner Violence: Unknown (10/28/2021)   Received from [facility]   HITS    Physically Hurt: Not on file    Insult or Talk Down To: Not on file    Threaten Physical Harm: Not on file    Scream or Curse: Not on file    FAMILY HISTORY: Family History  Problem Relation Age of Onset   Stroke Mother    Heart disease Mother    Drug abuse Mother    Alcohol abuse Mother    Depression Mother    Bipolar disorder Mother    Alcoholism Mother    Alcoholism Father    Drug abuse Father    Diabetes Father    Hypertension Father    Hyperlipidemia Father    Schizophrenia Father    Miscarriages / Stillbirths Sister    Hypertension Sister    Early death Sister    Intellectual disability Maternal Grandmother    Arthritis Maternal Grandmother    Hypertension Maternal Grandmother    Intellectual disability Maternal Grandfather    Hypertension Maternal Grandfather    COPD Maternal Grandfather    Alcohol abuse Maternal Grandfather    Heart attack Maternal Grandfather    Diabetes Paternal Grandmother    Arthritis Paternal Grandmother    Hypertension Paternal Grandmother    Intellectual disability Paternal Grandmother    Dementia Paternal Grandmother    Alcohol abuse Paternal Grandfather    Intellectual disability Paternal Grandfather    Congestive Heart Failure Son     ALLERGIES:  is allergic to tape.  MEDICATIONS:  Current Outpatient Medications  Medication Sig Dispense Refill   busPIRone  (BUSPAR ) 10 MG tablet Take 1 tablet (10 mg total) by mouth 2 (two) times daily. 180 tablet 0   clonazePAM  (KLONOPIN ) 0.5 MG tablet Take 1 tablet (0.5 mg total) by mouth 2 (two) times a day as needed for anxiety. 10 tablet 0   fluticasone  (FLONASE ) 50 MCG/ACT nasal spray Place 2 sprays  into both nostrils daily. (Patient taking differently: Place 2 sprays into both nostrils as needed.) 16 g 0   hydrOXYzine  (VISTARIL ) 25 MG capsule Take 1 capsule (25 mg total) by mouth every 8 (eight) hours as needed for anxiety. (to help with sleep) 90 capsule 0   linaclotide  (LINZESS ) 290 MCG CAPS capsule Take 1 capsule (290 mcg total) by mouth daily. 90 capsule 0   loratadine (CLARITIN) 10 MG tablet Take by mouth as needed.     Semaglutide -Weight Management (WEGOVY ) 1.7 MG/0.75ML SOAJ Inject 1.7 mg into the skin once a week. 3 mL 1   valACYclovir  (VALTREX ) 500  MG tablet Take 500 mg by mouth as needed.     venlafaxine  XR (EFFEXOR -XR) 150 MG 24 hr capsule Take 1 capsule (150 mg total) by mouth daily. 90 capsule 0   verapamil  (CALAN -SR) 180 MG CR tablet Take 1 tablet (180 mg total) by mouth at bedtime. 90 tablet 3   Vitamin D , Ergocalciferol , (DRISDOL ) 1.25 MG (50000 UNIT) CAPS capsule Take 1 capsule (50,000 Units total) by mouth every 14 (fourteen) days. 6 capsule 0   cyclobenzaprine  (FLEXERIL ) 10 MG tablet Take 0.5-1 tablets (5-10 mg total) by mouth 3 (three) times daily as needed. 30 tablet 0   Insulin  Pen Needle (PEN NEEDLES) 31G X 5 MM MISC Use once daily as directed 50 each 1   naproxen  (NAPROSYN ) 500 MG tablet Take 1 tablet (500 mg total) by mouth 2 (two) times daily with a meal. 30 tablet 0   No current facility-administered medications for this visit.    REVIEW OF SYSTEMS:   Constitutional: ( - ) fevers, ( - )  chills , ( - ) night sweats Eyes: ( - ) blurriness of vision, ( - ) double vision, ( - ) watery eyes Ears, nose, mouth, throat, and face: ( - ) mucositis, ( - ) sore throat Respiratory: ( - ) cough, ( - ) dyspnea, ( - ) wheezes Cardiovascular: ( - ) palpitation, ( - ) chest discomfort, ( - ) lower extremity swelling Gastrointestinal:  ( - ) nausea, ( - ) heartburn, ( - ) change in bowel habits Skin: ( - ) abnormal skin rashes Lymphatics: ( - ) new lymphadenopathy, ( - ) easy  bruising Neurological: ( - ) numbness, ( - ) tingling, ( - ) new weaknesses Behavioral/Psych: ( - ) mood change, ( - ) new changes  All other systems were reviewed with the patient and are negative.  PHYSICAL EXAMINATION:  Vitals:   02/10/24 1512  BP: 102/74  Pulse: 86  Resp: 17  Temp: (!) 97.3 F (36.3 C)  SpO2: 100%     Filed Weights   02/10/24 1512  Weight: 224 lb (101.6 kg)      GENERAL: well appearing young [place] female, alert, no distress and comfortable SKIN: skin color, texture, turgor are normal, no rashes or significant lesions EYES: conjunctiva are pink and non-injected, sclera clear LUNGS: clear to auscultation and percussion with normal breathing effort HEART: regular rate & rhythm and no murmurs and no lower extremity edema Musculoskeletal: no cyanosis of digits and no clubbing  PSYCH: alert & oriented x 3, fluent speech NEURO: no focal motor/sensory deficits  LABORATORY DATA:  I have reviewed the data as listed    Latest Ref Rng & Units 02/03/2024    7:30 AM 11/17/2023    3:36 PM 08/05/2023    1:41 PM  CBC  WBC 4.0 - 10.5 K/uL 4.4  6.1  6.1   Hemoglobin 12.0 - 15.0 g/dL 87.0  87.2  87.1   Hematocrit 36.0 - 46.0 % 40.9  40.9  40.4   Platelets 150 - 400 K/uL 255  260.0  258        Latest Ref Rng & Units 02/03/2024    7:30 AM 11/17/2023    3:36 PM 08/05/2023    1:41 PM  CMP  Glucose 70 - 99 mg/dL 895  92  899   BUN 6 - 20 mg/dL 14  13  14    Creatinine 0.44 - 1.00 mg/dL 9.16  9.31  9.02   Sodium 135 -  145 mmol/L 136  139  138   Potassium 3.5 - 5.1 mmol/L 4.2  4.7  4.5   Chloride 98 - 111 mmol/L 102  104  105   CO2 22 - 32 mmol/L 27  28  29    Calcium 8.9 - 10.3 mg/dL 9.1  9.1  9.1   Total Protein 6.5 - 8.1 g/dL 7.0  6.6  6.9   Total Bilirubin 0.0 - 1.2 mg/dL 0.5  0.3  0.3   Alkaline Phos 38 - 126 U/L 75  76  94   AST 15 - 41 U/L 11  13  15    ALT 0 - 44 U/L 9  10  13     RADIOGRAPHIC STUDIES: No results found.  ASSESSMENT &  PLAN JODEEN MCLIN is a 38 y.o. female with medical history significant for iron  deficiency anemia in pregnancy who presents for a follow up visit.  # Iron  Deficiency Anemia in Setting of Pregnancy   # Alpha Thalassemia Minor  -- Findings are consistent with iron  deficiency anemia secondary to patient's menorrhagia --Found to have alpha thalassemia trait. --Encouraged her to follow-up with OB/GYN for better control of her menstrual cycles following her delivery. --Patient unable to tolerate ferrous sulfate --Last received IV venofer  200 mg x 5 doses from 05/24/2023-07/01/2023. PLAN: --labs from 02/03/2024 reviewed with patient. No evidence of anemia with Hgb 12.9. Iron  panel shows no deficiency with iron  77, TIBC 287, saturation 27%, ferritin 352.  --No need for additional IV iron  at this time --RTC in 3 months for labs only and 6 months for labs/follow up  No orders of the defined types were placed in this encounter.  All questions were answered. The patient knows to call the clinic with any problems, questions or concerns.   I have spent a total of 25 minutes minutes of face-to-face and non-face-to-face time, preparing to see the patient, performing a medically appropriate examination, counseling and educating the patient,documenting clinical information in the electronic health record, independently interpreting results and communicating results to the patient, and care coordination.   Johnston Police PA-C Dept of Hematology and Oncology [facility] Phone: [phone]   02/10/2024 4:32 PM

## 2024-02-28 ENCOUNTER — Other Ambulatory Visit (HOSPITAL_COMMUNITY): Payer: Self-pay

## 2024-02-28 ENCOUNTER — Encounter: Payer: Self-pay | Admitting: Family Medicine

## 2024-02-28 ENCOUNTER — Ambulatory Visit: Admitting: Family Medicine

## 2024-02-28 VITALS — BP 133/84 | HR 72 | Temp 98.1°F | Ht 61.0 in | Wt 218.0 lb

## 2024-02-28 DIAGNOSIS — D508 Other iron deficiency anemias: Secondary | ICD-10-CM | POA: Diagnosis not present

## 2024-02-28 DIAGNOSIS — K9189 Other postprocedural complications and disorders of digestive system: Secondary | ICD-10-CM | POA: Diagnosis not present

## 2024-02-28 DIAGNOSIS — E559 Vitamin D deficiency, unspecified: Secondary | ICD-10-CM | POA: Diagnosis not present

## 2024-02-28 DIAGNOSIS — Z9884 Bariatric surgery status: Secondary | ICD-10-CM | POA: Diagnosis not present

## 2024-02-28 DIAGNOSIS — E66813 Obesity, class 3: Secondary | ICD-10-CM | POA: Diagnosis not present

## 2024-02-28 DIAGNOSIS — Z6841 Body Mass Index (BMI) 40.0 and over, adult: Secondary | ICD-10-CM | POA: Diagnosis not present

## 2024-02-28 DIAGNOSIS — R7989 Other specified abnormal findings of blood chemistry: Secondary | ICD-10-CM

## 2024-02-28 MED ORDER — VITAMIN D (ERGOCALCIFEROL) 1.25 MG (50000 UNIT) PO CAPS
50000.0000 [IU] | ORAL_CAPSULE | ORAL | 0 refills | Status: DC
  Filled 2024-02-28: qty 6, 84d supply, fill #0

## 2024-02-28 MED ORDER — CYANOCOBALAMIN 1000 MCG/ML IJ SOLN
1000.0000 ug | INTRAMUSCULAR | 3 refills | Status: DC
  Filled 2024-02-28: qty 1, 30d supply, fill #0
  Filled 2024-03-25: qty 1, 30d supply, fill #1
  Filled 2024-04-17 – 2024-04-18 (×2): qty 1, 30d supply, fill #2
  Filled 2024-05-04 – 2024-05-24 (×2): qty 1, 30d supply, fill #3

## 2024-02-28 MED ORDER — BD LUER-LOK SYRINGE 25G X 1" 3 ML MISC
9 refills | Status: DC
Start: 2024-02-28 — End: 2024-06-05
  Filled 2024-02-28: qty 10, 10d supply, fill #0
  Filled 2024-02-29: qty 1, 1d supply, fill #0
  Filled 2024-03-25: qty 1, 1d supply, fill #1
  Filled 2024-04-17: qty 1, 1d supply, fill #2
  Filled 2024-05-04 – 2024-05-24 (×2): qty 1, 1d supply, fill #3

## 2024-02-28 MED ORDER — WEGOVY 1.7 MG/0.75ML ~~LOC~~ SOAJ
1.7000 mg | SUBCUTANEOUS | 1 refills | Status: DC
  Filled 2024-02-28 – 2024-03-12 (×2): qty 3, 28d supply, fill #0
  Filled 2024-04-06: qty 3, 28d supply, fill #1

## 2024-02-28 NOTE — Progress Notes (Signed)
 Office: [phone]  /  Fax: [phone]  WEIGHT SUMMARY AND BIOMETRICS  Starting Date: 09/13/22  Starting Weight: 229lb   Weight Lost Since Last Visit: 1lb   Vitals Temp: 98.1 F (36.7 C) BP: 133/84 Pulse Rate: 72 SpO2: 98 %   Body Composition  Body Fat %: 50.5 % Fat Mass (lbs): 110.2 lbs Muscle Mass (lbs): 102.8 lbs Total Body Water (lbs): 77.6 lbs Visceral Fat Rating : 14   HPI  Chief Complaint: OBESITY  Natasha Rice is here to discuss her progress with her obesity treatment plan. She is on the the Category 3 Plan and states she is following her eating plan approximately 75 % of the time. She states she is exercising 30-60 minutes 2 times per week.  Interval History:  Since last office visit she is down 1 lb This gives her a net weight loss of 11 lb in 16 mos (she had a baby May 2024) Her kids started back to school today She has been using Wegovy  1.7 mg weekly She has been getting some menstrual food cravings (for sweets) She is getting in her fruits and veggies Denies meal skipping She is getting in lean protein with meals She is up from previous nadir weight of 154 lb post VSG Nov 2017 She plans to go to the gym in the mornings  Pharmacotherapy: Wegovy  1.7 mg   PHYSICAL EXAM:  Blood pressure 133/84, pulse 72, temperature 98.1 F (36.7 C), height 5' 1 (1.549 m), weight 218 lb (98.9 kg), last menstrual period 02/17/2024, SpO2 98%. Body mass index is 41.19 kg/m.  General: She is overweight, cooperative, alert, well developed, and in no acute distress. PSYCH: Has normal mood, affect and thought process.   Lungs: Normal breathing effort, no conversational dyspnea.   ASSESSMENT AND PLAN  TREATMENT PLAN FOR OBESITY:  Recommended Dietary Goals  Natasha Rice is currently in the action stage of change. As such, her goal is to continue weight management plan. She has agreed to keeping a food journal and adhering to recommended goals of 1400 calories and 100  g of protein and following a lower carbohydrate, vegetable and lean protein rich diet plan.  Behavioral Intervention  We discussed the following Behavioral Modification Strategies today: increasing lean protein intake to established goals, increasing fiber rich foods, increasing water intake , keeping healthy foods at home, practice mindfulness eating and understand the difference between hunger signals and cravings, continue to practice mindfulness when eating, planning for success, and continue to work on maintaining a reduced calorie state, getting the recommended amount of protein, incorporating whole foods, making healthy choices, staying well hydrated and practicing mindfulness when eating..  Additional resources provided today: NA  Recommended Physical Activity Goals  Natasha Rice has been advised to work up to 150 minutes of moderate intensity aerobic activity a week and strengthening exercises 2-3 times per week for cardiovascular health, weight loss maintenance and preservation of muscle mass.   She has agreed to [employer] strengthening exercises with a goal of 2-3 sessions a week  and Start aerobic activity with a goal of 150 minutes a week at moderate intensity.  Aim for gym workouts 3 days a week and 1 outdoor physical activity on the weekends with kids  Pharmacotherapy changes for the treatment of obesity: None  ASSOCIATED CONDITIONS ADDRESSED TODAY  Low vitamin B12 level She has complaints of fatigue and has needed additional B12 injections since her vertical sleeve gastrectomy done in 2017.  She denies brain fog or paresthesias.  She is  currently not taking a B12 supplement her last level was low normal.  Begin vitamin B12 1000 mcg once monthly injections at home.  Recheck level in the next 2 to 3 months -     Cyanocobalamin ; Inject 1 mL (1,000 mcg total) into the muscle every 30 (thirty) days.  Dispense: 1 mL; Refill: 3 -     Insulin  Syringe; Use once daily as directed  Dispense: 10  each; Refill: 0  Class 3 severe obesity due to excess calories with body mass index (BMI) of 40.0 to 44.9 in adult, unspecified whether serious comorbidity present -     Wegovy ; Inject 1.7 mg into the skin once a week.  Dispense: 3 mL; Refill: 1 She has adequate satiety from Wegovy  1.7 mg once weekly injection without meal skipping or GI side effects.  She declined need for increased dose.  Vitamin D  deficiency She is now taking vitamin D  50,000 IUs q. 14 days.  Continue and recheck level in 2 to 3 months -     Vitamin D  (Ergocalciferol ); Take 1 capsule (50,000 Units total) by mouth every 14 (fourteen) days.  Dispense: 6 capsule; Refill: 0  S/P laparoscopic sleeve gastrectomy She has improved satiety and is happy to see weight loss with use of Wegovy  for postop regain.  Her pregnancy in May 2024 did make it more difficult to achieve weight reduction.  She is happy with the improved satiety and reduction of food cravings while on Wegovy .  Continue work on the protein and fiber intake with meals, adding in weight training at the gym 3 days a week and increase walking planned throughout the week.  Recommend swapping out her regular multivitamin for a bariatric multivitamin with iron  daily.  Iron  deficiency anemia after gastrectomy Iron  levels and CBC did not improve, rechecked by hematology after IV iron  infusions.  Her menses are still occurring every 2 to 4 weeks but are not heavy in nature.  She is currently not on birth control.  She is not taking oral iron  supplement.  I did recommend taking a bariatric multivitamin with iron  in it daily for maintenance.     She was informed of the importance of frequent follow up visits to maximize her success with intensive lifestyle modifications for her multiple health conditions.   ATTESTASTION STATEMENTS:  Reviewed by clinician on day of visit: allergies, medications, problem list, medical history, surgical history, family history, social history,  and previous encounter notes pertinent to obesity diagnosis.   I have personally spent 30 minutes total time today in preparation, patient care, nutritional counseling and education,  and documentation for this visit, including the following: review of most recent clinical lab tests, prescribing medications/ refilling medications, reviewing medical assistant documentation, review and interpretation of bioimpedence results.     Darice Haddock, D.O. DABFM, DABOM [facility] [address] [place], [place] [address] [phone]

## 2024-02-28 NOTE — Patient Instructions (Signed)
 Bariatric MVI with iron  daily in place of gummy MVI daily  Vitamin D  q 14 days  B12 injections monthly   Continue Wegovy  1.7 mg weekly  Let's repeat vitamin D , B12, TSH in 2 mos

## 2024-02-29 ENCOUNTER — Other Ambulatory Visit (HOSPITAL_COMMUNITY): Payer: Self-pay

## 2024-03-12 ENCOUNTER — Other Ambulatory Visit (HOSPITAL_COMMUNITY): Payer: Self-pay

## 2024-04-03 ENCOUNTER — Ambulatory Visit: Admitting: Cardiovascular Disease

## 2024-04-06 ENCOUNTER — Other Ambulatory Visit (HOSPITAL_COMMUNITY): Payer: Self-pay

## 2024-04-10 ENCOUNTER — Telehealth: Payer: Self-pay | Admitting: *Deleted

## 2024-04-10 NOTE — Telephone Encounter (Signed)
 Prior authorization done via cover my meds for patients Wegovy  1.7 mg. Waiting on determination.

## 2024-04-11 ENCOUNTER — Encounter: Payer: Self-pay | Admitting: Family Medicine

## 2024-04-11 ENCOUNTER — Telehealth: Admitting: Physician Assistant

## 2024-04-11 ENCOUNTER — Ambulatory Visit (INDEPENDENT_AMBULATORY_CARE_PROVIDER_SITE_OTHER): Admitting: Family Medicine

## 2024-04-11 ENCOUNTER — Other Ambulatory Visit (HOSPITAL_COMMUNITY): Payer: Self-pay

## 2024-04-11 VITALS — BP 111/80 | HR 87 | Temp 98.0°F | Ht 61.0 in | Wt 214.0 lb

## 2024-04-11 DIAGNOSIS — R7989 Other specified abnormal findings of blood chemistry: Secondary | ICD-10-CM | POA: Diagnosis not present

## 2024-04-11 DIAGNOSIS — Z9884 Bariatric surgery status: Secondary | ICD-10-CM | POA: Diagnosis not present

## 2024-04-11 DIAGNOSIS — E559 Vitamin D deficiency, unspecified: Secondary | ICD-10-CM

## 2024-04-11 DIAGNOSIS — M545 Low back pain, unspecified: Secondary | ICD-10-CM

## 2024-04-11 DIAGNOSIS — D508 Other iron deficiency anemias: Secondary | ICD-10-CM | POA: Diagnosis not present

## 2024-04-11 DIAGNOSIS — R102 Pelvic and perineal pain: Secondary | ICD-10-CM

## 2024-04-11 DIAGNOSIS — K9189 Other postprocedural complications and disorders of digestive system: Secondary | ICD-10-CM | POA: Diagnosis not present

## 2024-04-11 DIAGNOSIS — Z6841 Body Mass Index (BMI) 40.0 and over, adult: Secondary | ICD-10-CM

## 2024-04-11 DIAGNOSIS — E66813 Obesity, class 3: Secondary | ICD-10-CM | POA: Diagnosis not present

## 2024-04-11 DIAGNOSIS — R109 Unspecified abdominal pain: Secondary | ICD-10-CM

## 2024-04-11 MED ORDER — WEGOVY 1.7 MG/0.75ML ~~LOC~~ SOAJ
1.7000 mg | SUBCUTANEOUS | 1 refills | Status: DC
  Filled 2024-04-11: qty 3, 28d supply, fill #0
  Filled 2024-05-02: qty 3, 28d supply, fill #1

## 2024-04-11 NOTE — Progress Notes (Signed)
 Office: [phone]  /  Fax: [phone]  WEIGHT SUMMARY AND BIOMETRICS  Starting Date: 09/13/22  Starting Weight: 229lb   Weight Lost Since Last Visit: 4lb   Vitals Temp: 98 F (36.7 C) BP: 111/80 Pulse Rate: 87 SpO2: 99 %   Body Composition  Body Fat %: 48.5 % Fat Mass (lbs): 104.2 lbs Muscle Mass (lbs): 105 lbs Total Body Water (lbs): 74.4 lbs Visceral Fat Rating : 13    HPI  Chief Complaint: OBESITY  Natasha Rice is here to discuss her progress with her obesity treatment plan. She is on the the Category 3 Plan and states she is following her eating plan approximately 80-85 % of the time. She states she is exercising 60 minutes 2-3 times per week.  Interval History:  Since last office visit she is down 4 lb This gives her a net weight loss of 15 lb in 17 mos of medically supervised weight management (had a surrogate pregnancy May 2024) This is a 6.5% TBW loss She has improved satiety from Wegovy  1.7 mg weekly She denies meal skipping or GI upset Seeing a therapist and is working on stress reduction She is going to the gym 2-3 mornings a week doing row and stairmaster and sometimes treadmill and plans to add in more weight training She is tracking steps, getting at least 5,000 steps/ day Her menstrual cycles have been heavier  Pharmacotherapy: Wegovy  1.7 weekly  PHYSICAL EXAM:  Blood pressure 111/80, pulse 87, temperature 98 F (36.7 C), height 5' 1 (1.549 m), weight 214 lb (97.1 kg), SpO2 99%. Body mass index is 40.43 kg/m.  General: She is overweight, cooperative, alert, well developed, and in no acute distress. PSYCH: Has normal mood, affect and thought process.   Lungs: Normal breathing effort, no conversational dyspnea.  ASSESSMENT AND PLAN  TREATMENT PLAN FOR OBESITY:  Recommended Dietary Goals  Natasha Rice is currently in the action stage of change. As such, her goal is to continue weight management plan. She has agreed to keeping a food  journal and adhering to recommended goals of 1400 calories and 90 g of  protein and following a lower carbohydrate, vegetable and lean protein rich diet plan.  Behavioral Intervention  We discussed the following Behavioral Modification Strategies today: increasing lean protein intake to established goals, work on meal planning and preparation, keeping healthy foods at home, work on managing stress, creating time for self-care and relaxation, planning for success, and continue to work on maintaining a reduced calorie state, getting the recommended amount of protein, incorporating whole foods, making healthy choices, staying well hydrated and practicing mindfulness when eating..  Additional resources provided today: NA  Recommended Physical Activity Goals  Natasha Rice has been advised to work up to 150 minutes of moderate intensity aerobic activity a week and strengthening exercises 2-3 times per week for cardiovascular health, weight loss maintenance and preservation of muscle mass.   She has agreed to [employer] strengthening exercises with a goal of 2-3 sessions a week  and Increase the intensity, frequency or duration of aerobic exercises    Pharmacotherapy changes for the treatment of obesity: none  ASSOCIATED CONDITIONS ADDRESSED TODAY  Vitamin D  deficiency Last vitamin D  Lab Results  Component Value Date   VD25OH 65.7 09/21/2023   Moved RX vitamin D  to q 14 days Energy level stable Repeat vitamin D  level in 2 mos  Class 3 severe obesity due to excess calories with body mass index (BMI) of 40.0 to 44.9 in adult, unspecified whether serious  comorbidity present -     Wegovy ; Inject 1.7 mg into the skin once a week.  Dispense: 3 mL; Refill: 1 Has improved satiety and weight reduction with Wegovy  1.7 mg w/o meal skipping or GI upset, continue at current dose.  Ramp up resistance training to 3 days/ wk and track daily steps  S/P laparoscopic sleeve gastrectomy Improved satiety and compliance  with a post bariatric surgery diet using Wegovy  + medically supervised weight management  Low vitamin B12 level Lab Results  Component Value Date   VITAMINB12 338 11/17/2023  Started Vitamin B12 monthly injection Energy level improving Recheck level in 2 mos  Iron  deficiency anemia after gastrectomy S/p IV iron  infusions with Dr Federico Scheduled for repeat labs with hematology next month Energy level improved  Menses have been heavier    She was informed of the importance of frequent follow up visits to maximize her success with intensive lifestyle modifications for her multiple health conditions.   ATTESTASTION STATEMENTS:  Reviewed by clinician on day of visit: allergies, medications, problem list, medical history, surgical history, family history, social history, and previous encounter notes pertinent to obesity diagnosis.   I have personally spent 30 minutes total time today in preparation, patient care, nutritional counseling and education,  and documentation for this visit, including the following: review of most recent clinical lab tests, prescribing medications/ refilling medications, reviewing medical assistant documentation, review and interpretation of bioimpedence results.     Darice Haddock, D.O. DABFM, DABOM [facility] [address] [place], [place] [address] [phone]

## 2024-04-11 NOTE — Telephone Encounter (Signed)
 Prior authorization approved for patients Wegovy  1.7. Patient notified.

## 2024-04-11 NOTE — Progress Notes (Signed)
  Because of having vaginal pain with back and stomach pain, I feel your condition warrants further evaluation and I recommend that you be seen in a face-to-face visit.   NOTE: There will be NO CHARGE for this E-Visit   If you are having a true medical emergency, please call 911.     For an urgent face to face visit, [facility] has multiple urgent care centers for your convenience.  Click the link below for the full list of locations and hours, walk-in wait times, appointment scheduling options and driving directions:  Urgent Care - [place], [place], [place], [place], [place], [place]  [place]     Your MyChart E-visit questionnaire answers were reviewed by a board certified advanced clinical practitioner to complete your personal care plan based on your specific symptoms.    Thank you for using e-Visits.     I have spent 5 minutes in review of e-visit questionnaire, review and updating patient chart, medical decision making and response to patient.   Delon CHRISTELLA Dickinson, PA-C

## 2024-04-17 ENCOUNTER — Other Ambulatory Visit (HOSPITAL_COMMUNITY): Payer: Self-pay

## 2024-04-17 ENCOUNTER — Other Ambulatory Visit: Payer: Self-pay

## 2024-04-23 ENCOUNTER — Encounter (HOSPITAL_COMMUNITY): Payer: Self-pay

## 2024-04-23 ENCOUNTER — Encounter: Payer: Self-pay | Admitting: Family Medicine

## 2024-04-23 ENCOUNTER — Other Ambulatory Visit (HOSPITAL_COMMUNITY): Payer: Self-pay

## 2024-04-23 NOTE — Telephone Encounter (Signed)
 Last seen in April, not really given a time frame to come back, but seems she should have appt. Pended 30 day supplies with note that she needs appt. Sending for review since one is controlled. Thanks.

## 2024-04-24 ENCOUNTER — Other Ambulatory Visit (HOSPITAL_BASED_OUTPATIENT_CLINIC_OR_DEPARTMENT_OTHER): Payer: Self-pay

## 2024-04-24 ENCOUNTER — Other Ambulatory Visit (HOSPITAL_COMMUNITY): Payer: Self-pay

## 2024-04-24 MED ORDER — VENLAFAXINE HCL ER 150 MG PO CP24
150.0000 mg | ORAL_CAPSULE | Freq: Every day | ORAL | 0 refills | Status: DC
  Filled 2024-04-24 (×2): qty 30, 30d supply, fill #0

## 2024-04-24 MED ORDER — BUSPIRONE HCL 10 MG PO TABS
10.0000 mg | ORAL_TABLET | Freq: Two times a day (BID) | ORAL | 0 refills | Status: DC
  Filled 2024-04-24 (×2): qty 60, 30d supply, fill #0

## 2024-04-24 MED ORDER — CLONAZEPAM 0.5 MG PO TABS
0.5000 mg | ORAL_TABLET | Freq: Two times a day (BID) | ORAL | 0 refills | Status: DC | PRN
  Filled 2024-04-24 (×2): qty 10, 5d supply, fill #0

## 2024-04-26 ENCOUNTER — Other Ambulatory Visit (HOSPITAL_COMMUNITY): Payer: Self-pay

## 2024-04-29 ENCOUNTER — Telehealth: Admitting: Family Medicine

## 2024-04-29 DIAGNOSIS — J029 Acute pharyngitis, unspecified: Secondary | ICD-10-CM | POA: Diagnosis not present

## 2024-04-29 NOTE — Progress Notes (Signed)
 We are sorry that you are not feeling well.  Here is how we plan to help!  Your symptoms indicate a likely viral infection (Pharyngitis).   Pharyngitis is inflammation in the back of the throat which can cause a sore throat, scratchiness and sometimes difficulty swallowing.   Pharyngitis is typically caused by a respiratory virus and will just run its course.  Please keep in mind that your symptoms could last up to 10 days.  For throat pain, we recommend over the counter oral pain relief medications such as acetaminophen  or aspirin, or anti-inflammatory medications such as ibuprofen  or naproxen  sodium.  Topical treatments such as oral throat lozenges or sprays may be used as needed.  Avoid close contact with loved ones, especially the very young and elderly.  Remember to wash your hands thoroughly throughout the day as this is the number one way to prevent the spread of infection and wipe down door knobs and counters with disinfectant.  After careful review of your answers, I would not recommend and antibiotic for your condition.  Antibiotics should not be used to treat conditions that we suspect are caused by viruses like the virus that causes the common cold or flu. However, some people can have Strep with atypical symptoms. You may need formal testing in clinic or office to confirm if your symptoms continue or worsen.  Providers prescribe antibiotics to treat infections caused by bacteria. Antibiotics are very powerful in treating bacterial infections when they are used properly.  To maintain their effectiveness, they should be used only when necessary.  Overuse of antibiotics has resulted in the development of super bugs that are resistant to treatment!    Home Care: Only take medications as instructed by your medical team. Do not drink alcohol while taking these medications. A steam or ultrasonic humidifier can help congestion.  You can place a towel over your head and breathe in the steam from hot  water  coming from a faucet. Avoid close contacts especially the very young and the elderly. Cover your mouth when you cough or sneeze. Always remember to wash your hands.  Get Help Right Away If: You develop worsening fever or throat pain. You develop a severe head ache or visual changes. Your symptoms persist after you have completed your treatment plan.  Make sure you Understand these instructions. Will watch your condition. Will get help right away if you are not doing well or get worse.  Your e-visit answers were reviewed by a board certified advanced clinical practitioner to complete your personal care plan.  Depending on the condition, your plan could have included both over the counter or prescription medications.  If there is a problem please reply  once you have received a response from your provider.  Your safety is important to us .  If you have drug allergies check your prescription carefully.    You can use MyChart to ask questions about todays visit, request a non-urgent call back, or ask for a work or school excuse for 24 hours related to this e-Visit. If it has been greater than 24 hours you will need to follow up with your provider, or enter a new e-Visit to address those concerns.  You will get an e-mail in the next two days asking about your experience.  I hope that your e-visit has been valuable and will speed your recovery. Thank you for using e-visits.   I have spent 5 minutes in review of e-visit questionnaire, review and updating patient chart, medical  decision making and response to patient.   Niomie Englert, FNP

## 2024-05-01 ENCOUNTER — Telehealth: Admitting: Physician Assistant

## 2024-05-01 ENCOUNTER — Other Ambulatory Visit (HOSPITAL_COMMUNITY): Payer: Self-pay

## 2024-05-01 DIAGNOSIS — B9689 Other specified bacterial agents as the cause of diseases classified elsewhere: Secondary | ICD-10-CM

## 2024-05-01 MED ORDER — AMOXICILLIN-POT CLAVULANATE 875-125 MG PO TABS
1.0000 | ORAL_TABLET | Freq: Two times a day (BID) | ORAL | 0 refills | Status: DC
  Filled 2024-05-01: qty 14, 7d supply, fill #0

## 2024-05-01 NOTE — Progress Notes (Signed)

## 2024-05-02 ENCOUNTER — Other Ambulatory Visit: Payer: Self-pay | Admitting: Family Medicine

## 2024-05-02 ENCOUNTER — Other Ambulatory Visit (HOSPITAL_COMMUNITY): Payer: Self-pay

## 2024-05-02 ENCOUNTER — Other Ambulatory Visit: Payer: Self-pay

## 2024-05-02 MED ORDER — LINACLOTIDE 290 MCG PO CAPS
290.0000 ug | ORAL_CAPSULE | Freq: Every day | ORAL | 0 refills | Status: DC
  Filled 2024-05-02: qty 90, 90d supply, fill #0

## 2024-05-04 ENCOUNTER — Telehealth: Admitting: Physician Assistant

## 2024-05-04 ENCOUNTER — Other Ambulatory Visit: Payer: Self-pay

## 2024-05-04 ENCOUNTER — Other Ambulatory Visit (HOSPITAL_COMMUNITY): Payer: Self-pay

## 2024-05-04 DIAGNOSIS — H109 Unspecified conjunctivitis: Secondary | ICD-10-CM

## 2024-05-04 MED ORDER — OFLOXACIN 0.3 % OP SOLN
1.0000 [drp] | Freq: Four times a day (QID) | OPHTHALMIC | 0 refills | Status: AC
  Filled 2024-05-04: qty 10, 25d supply, fill #0

## 2024-05-04 NOTE — Progress Notes (Signed)

## 2024-05-11 ENCOUNTER — Inpatient Hospital Stay: Attending: Hematology and Oncology

## 2024-05-11 DIAGNOSIS — Z79899 Other long term (current) drug therapy: Secondary | ICD-10-CM | POA: Diagnosis not present

## 2024-05-11 DIAGNOSIS — D500 Iron deficiency anemia secondary to blood loss (chronic): Secondary | ICD-10-CM

## 2024-05-11 DIAGNOSIS — D509 Iron deficiency anemia, unspecified: Secondary | ICD-10-CM | POA: Insufficient documentation

## 2024-05-11 LAB — CMP (CANCER CENTER ONLY)
ALT: 11 U/L (ref 0–44)
AST: 13 U/L — ABNORMAL LOW (ref 15–41)
Albumin: 4 g/dL (ref 3.5–5.0)
Alkaline Phosphatase: 71 U/L (ref 38–126)
Anion gap: 3 — ABNORMAL LOW (ref 5–15)
BUN: 14 mg/dL (ref 6–20)
CO2: 29 mmol/L (ref 22–32)
Calcium: 9.3 mg/dL (ref 8.9–10.3)
Chloride: 106 mmol/L (ref 98–111)
Creatinine: 0.72 mg/dL (ref 0.44–1.00)
GFR, Estimated: >60 mL/min (ref >60)
Glucose, Bld: 85 mg/dL (ref 70–99)
Potassium: 4.6 mmol/L (ref 3.5–5.1)
Sodium: 138 mmol/L (ref 135–145)
Total Bilirubin: 0.5 mg/dL (ref 0.0–1.2)
Total Protein: 6.9 g/dL (ref 6.5–8.1)

## 2024-05-11 LAB — CBC WITH DIFFERENTIAL (CANCER CENTER ONLY)
Abs Immature Granulocytes: 0.01 K/uL (ref 0.00–0.07)
Basophils Absolute: 0.1 K/uL (ref 0.0–0.1)
Basophils Relative: 1 %
Eosinophils Absolute: 0.1 K/uL (ref 0.0–0.5)
Eosinophils Relative: 2 %
HCT: 38.3 % (ref 36.0–46.0)
Hemoglobin: 12.4 g/dL (ref 12.0–15.0)
Immature Granulocytes: 0 %
Lymphocytes Relative: 29 %
Lymphs Abs: 1.6 K/uL (ref 0.7–4.0)
MCH: 23.3 pg — ABNORMAL LOW (ref 26.0–34.0)
MCHC: 32.4 g/dL (ref 30.0–36.0)
MCV: 72 fL — ABNORMAL LOW (ref 80.0–100.0)
Monocytes Absolute: 0.4 K/uL (ref 0.1–1.0)
Monocytes Relative: 8 %
Neutro Abs: 3.2 K/uL (ref 1.7–7.7)
Neutrophils Relative %: 60 %
Platelet Count: 250 K/uL (ref 150–400)
RBC: 5.32 MIL/uL — ABNORMAL HIGH (ref 3.87–5.11)
RDW: 14.6 % (ref 11.5–15.5)
WBC Count: 5.3 K/uL (ref 4.0–10.5)
nRBC: 0 % (ref 0.0–0.2)

## 2024-05-11 LAB — IRON AND IRON BINDING CAPACITY (CC-WL,HP ONLY)
Iron: 88 ug/dL (ref 28–170)
Saturation Ratios: 32 % — ABNORMAL HIGH (ref 10.4–31.8)
TIBC: 279 ug/dL (ref 250–450)
UIBC: 191 ug/dL (ref 148–442)

## 2024-05-11 LAB — FERRITIN: Ferritin: 330 ng/mL — ABNORMAL HIGH (ref 11–307)

## 2024-05-14 ENCOUNTER — Other Ambulatory Visit (HOSPITAL_COMMUNITY): Payer: Self-pay

## 2024-05-15 ENCOUNTER — Ambulatory Visit: Payer: Self-pay | Admitting: Physician Assistant

## 2024-05-15 ENCOUNTER — Other Ambulatory Visit: Payer: Self-pay

## 2024-05-15 ENCOUNTER — Other Ambulatory Visit (HOSPITAL_COMMUNITY): Payer: Self-pay

## 2024-05-15 ENCOUNTER — Other Ambulatory Visit: Payer: Self-pay | Admitting: Family Medicine

## 2024-05-16 ENCOUNTER — Encounter: Payer: Self-pay | Admitting: Family Medicine

## 2024-05-16 ENCOUNTER — Other Ambulatory Visit (HOSPITAL_COMMUNITY): Payer: Self-pay

## 2024-05-16 MED ORDER — VALACYCLOVIR HCL 500 MG PO TABS
500.0000 mg | ORAL_TABLET | Freq: Two times a day (BID) | ORAL | 0 refills | Status: AC | PRN
  Filled 2024-05-16: qty 30, 15d supply, fill #0

## 2024-05-23 ENCOUNTER — Other Ambulatory Visit (HOSPITAL_COMMUNITY): Payer: Self-pay

## 2024-05-23 ENCOUNTER — Encounter: Payer: Self-pay | Admitting: Family Medicine

## 2024-05-23 ENCOUNTER — Ambulatory Visit: Payer: Self-pay | Admitting: Family Medicine

## 2024-05-23 ENCOUNTER — Ambulatory Visit: Admitting: Family Medicine

## 2024-05-23 ENCOUNTER — Other Ambulatory Visit (HOSPITAL_COMMUNITY)
Admission: RE | Admit: 2024-05-23 | Discharge: 2024-05-23 | Disposition: A | Discharge status: Home / Self Care | Source: Ambulatory Visit | Attending: Family Medicine | Admitting: Family Medicine

## 2024-05-23 VITALS — BP 126/88 | HR 81 | Temp 98.2°F | Resp 16 | Ht 61.5 in | Wt 215.6 lb

## 2024-05-23 DIAGNOSIS — B3731 Acute candidiasis of vulva and vagina: Secondary | ICD-10-CM

## 2024-05-23 DIAGNOSIS — N898 Other specified noninflammatory disorders of vagina: Secondary | ICD-10-CM

## 2024-05-23 DIAGNOSIS — Z113 Encounter for screening for infections with a predominantly sexual mode of transmission: Secondary | ICD-10-CM | POA: Diagnosis not present

## 2024-05-23 DIAGNOSIS — F419 Anxiety disorder, unspecified: Secondary | ICD-10-CM

## 2024-05-23 DIAGNOSIS — F32A Depression, unspecified: Secondary | ICD-10-CM | POA: Diagnosis not present

## 2024-05-23 DIAGNOSIS — R002 Palpitations: Secondary | ICD-10-CM | POA: Diagnosis not present

## 2024-05-23 DIAGNOSIS — I10 Essential (primary) hypertension: Secondary | ICD-10-CM

## 2024-05-23 DIAGNOSIS — Z0000 Encounter for general adult medical examination without abnormal findings: Secondary | ICD-10-CM | POA: Diagnosis not present

## 2024-05-23 DIAGNOSIS — B9689 Other specified bacterial agents as the cause of diseases classified elsewhere: Secondary | ICD-10-CM

## 2024-05-23 LAB — LIPID PANEL
Cholesterol: 209 mg/dL — ABNORMAL HIGH (ref 0–200)
HDL: 56.3 mg/dL (ref >39.00)
LDL Cholesterol: 131 mg/dL — ABNORMAL HIGH (ref 0–99)
NonHDL: 152.41
Total CHOL/HDL Ratio: 4
Triglycerides: 107 mg/dL (ref 0.0–149.0)
VLDL: 21.4 mg/dL (ref 0.0–40.0)

## 2024-05-23 LAB — TSH: TSH: 0.72 u[IU]/mL (ref 0.35–5.50)

## 2024-05-23 MED ORDER — HYDROXYZINE PAMOATE 25 MG PO CAPS
25.0000 mg | ORAL_CAPSULE | Freq: Three times a day (TID) | ORAL | 0 refills | Status: AC | PRN
  Filled 2024-05-23: qty 90, 30d supply, fill #0

## 2024-05-23 MED ORDER — LINACLOTIDE 290 MCG PO CAPS
290.0000 ug | ORAL_CAPSULE | Freq: Every day | ORAL | 0 refills | Status: AC
  Filled 2024-05-23 – 2024-07-28 (×2): qty 90, 90d supply, fill #0

## 2024-05-23 MED ORDER — VENLAFAXINE HCL ER 150 MG PO CP24
150.0000 mg | ORAL_CAPSULE | Freq: Every day | ORAL | 0 refills | Status: DC
  Filled 2024-05-23: qty 30, 30d supply, fill #0

## 2024-05-23 MED ORDER — BUSPIRONE HCL 10 MG PO TABS
10.0000 mg | ORAL_TABLET | Freq: Two times a day (BID) | ORAL | 0 refills | Status: DC
  Filled 2024-05-23: qty 60, 30d supply, fill #0

## 2024-05-23 MED ORDER — VERAPAMIL HCL ER 180 MG PO TBCR
180.0000 mg | EXTENDED_RELEASE_TABLET | Freq: Every day | ORAL | 3 refills | Status: AC
  Filled 2024-05-23 – 2024-06-25 (×2): qty 90, 90d supply, fill #0

## 2024-05-23 NOTE — Assessment & Plan Note (Signed)
 Stable on current regimen. No longer needing clonazepam . No SI/HI.

## 2024-05-23 NOTE — Progress Notes (Signed)
 Complete physical exam  Patient: Natasha Rice   DOB: [birth year]   38 y.o. Female  MRN: [id number]  Subjective:    Chief Complaint  Patient presents with   Annual Exam    Pt states not fasting     Natasha Rice is a 38 y.o. female who presents today for a complete physical exam. She reports consuming a general diet. She is trying to stay active. She generally feels well. She reports sleeping well. She does not have additional problems to discuss today.   Currently lives with: grandparents, children Acute concerns or interim problems since last visit:  - she has noticed a few days of white vaginal discharge with some itching  Chronic Problems  Hypertension; Palpitations: - Medications: Verapamil  180 mg daily. - Compliance: good - Checking BP at home: controlled - Denies any SOB, recurrent headaches, CP, vision changes, LE edema, dizziness, or medication side effects. BP Readings from Last 3 Encounters:  05/23/24 126/88  04/11/24 111/80  02/28/24 133/84    Insulin  Resistance: - Medications: Vicotza 1.2 mg daily - Compliance: good - Denies symptoms of hypoglycemia, polyuria, polydipsia, numbness extremities, foot ulcers/trauma, wounds that are not healing, medication side effects   Anxiety and Depression: - Treatment: Buspar  10 mg BID, Klonopin  0.5 mg as needed BID, Hydroxyzine  25 mg as needed for sleep, Effexor  150 mg daily.  - Medication side effects: none - SI/HI: no  Anemia, iron  deficiency: - stable, following with hematology    Vision concerns: no Dental concerns: no STD concerns: no, requests swab for vaginal d/c and add on STD screening  ETOH use: rare Nicotine use: no Recreational drugs/illegal substances: no  Females:  She is currently  sexually active  Contraception choices are: condoms LMP: 05/06/24      Most recent fall risk assessment:    05/23/2024    8:24 AM  Fall Risk   Falls in the past year? 0  Number falls in past yr: 0   Injury with Fall? 0  Follow up Falls evaluation completed     Most recent depression screenings:    05/23/2024    8:24 AM 07/01/2023    3:13 PM  PHQ 2/9 Scores  PHQ - 2 Score 4 0  PHQ- 9 Score 10             Patient Care Team: Almarie Waddell NOVAK, NP as PCP - General (Family Medicine) Ladona Heinz, MD as PCP - Cardiology (Cardiology) Tobie Karole LOISE., MD (Obstetrics and Gynecology)   Outpatient Medications Prior to Visit  Medication Sig   cyanocobalamin  (VITAMIN B12) 1000 MCG/ML injection Inject 1 mL (1,000 mcg total) into the muscle every 30 (thirty) days.   fluticasone  (FLONASE ) 50 MCG/ACT nasal spray Place 2 sprays into both nostrils daily. (Patient taking differently: Place 2 sprays into both nostrils as needed.)   liraglutide  (VICTOZA ) 18 MG/3ML SOPN Inject 1.2 mg into the skin daily.   loratadine (CLARITIN) 10 MG tablet Take by mouth as needed.   SYRINGE-NEEDLE, DISP, 3 ML (B-D 3CC LUER-LOK SYR 25GX1) 25G X 1 3 ML MISC Use monthly with b12 injection   valACYclovir  (VALTREX ) 500 MG tablet Take 500 mg by mouth as needed.   valACYclovir  (VALTREX ) 500 MG tablet Take 1 tablet (500 mg total) by mouth 2 (two) times daily for 3 days as needed.   Vitamin D , Ergocalciferol , (DRISDOL ) 1.25 MG (50000 UNIT) CAPS capsule Take 1 capsule (50,000 Units total) by mouth every 14 (fourteen) days.   [DISCONTINUED]  busPIRone  (BUSPAR ) 10 MG tablet Take 1 tablet (10 mg total) by mouth 2 (two) times daily. APPT FOR REFILLS   [DISCONTINUED] clonazePAM  (KLONOPIN ) 0.5 MG tablet Take 1 tablet (0.5 mg total) by mouth 2 (two) times daily as needed. APPT FOR REFILLS   [DISCONTINUED] hydrOXYzine  (VISTARIL ) 25 MG capsule Take 1 capsule (25 mg total) by mouth every 8 (eight) hours as needed for anxiety. (to help with sleep)   [DISCONTINUED] linaclotide  (LINZESS ) 290 MCG CAPS capsule Take 1 capsule (290 mcg total) by mouth daily before breakfast.   [DISCONTINUED] venlafaxine  XR (EFFEXOR -XR) 150 MG 24 hr  capsule Take 1 capsule (150 mg total) by mouth daily. APPT FOR REFILLS   [DISCONTINUED] verapamil  (CALAN -SR) 180 MG CR tablet Take 1 tablet (180 mg total) by mouth at bedtime.   [DISCONTINUED] amoxicillin -clavulanate (AUGMENTIN ) 875-125 MG tablet Take 1 tablet by mouth 2 (two) times daily.   [DISCONTINUED] semaglutide -weight management (WEGOVY ) 1.7 MG/0.75ML SOAJ SQ injection Inject 1.7 mg into the skin once a week.   No facility-administered medications prior to visit.    ROS All ROS negative except what is listed in the HPI.        Objective:     BP 126/88 (BP Location: Right Arm, Patient Position: Sitting, Cuff Size: Large)   Pulse 81   Temp 98.2 F (36.8 C) (Oral)   Resp 16   Ht 5' 1.5 (1.562 m)   Wt 215 lb 9.6 oz (97.8 kg)   SpO2 100%   BMI 40.08 kg/m    Physical Exam Vitals reviewed.  Constitutional:      General: She is not in acute distress.    Appearance: Normal appearance. She is obese. She is not ill-appearing.  HENT:     Head: Normocephalic and atraumatic.     Right Ear: Tympanic membrane normal.     Left Ear: Tympanic membrane normal.     Nose: Nose normal.     Mouth/Throat:     Mouth: Mucous membranes are moist.     Pharynx: Oropharynx is clear.  Eyes:     Extraocular Movements: Extraocular movements intact.     Conjunctiva/sclera: Conjunctivae normal.     Pupils: Pupils are equal, round, and reactive to light.  Cardiovascular:     Rate and Rhythm: Normal rate and regular rhythm.     Pulses: Normal pulses.     Heart sounds: Normal heart sounds.  Pulmonary:     Effort: Pulmonary effort is normal.     Breath sounds: Normal breath sounds.  Abdominal:     General: Abdomen is flat. Bowel sounds are normal. There is no distension.     Palpations: Abdomen is soft. There is no mass.     Tenderness: There is no abdominal tenderness. There is no right CVA tenderness, left CVA tenderness, guarding or rebound.  Genitourinary:    Comments: Deferred exam,  self swab Musculoskeletal:        General: Normal range of motion.     Cervical back: Normal range of motion and neck supple. No tenderness.     Right lower leg: No edema.     Left lower leg: No edema.  Lymphadenopathy:     Cervical: No cervical adenopathy.  Skin:    General: Skin is warm and dry.     Capillary Refill: Capillary refill takes less than 2 seconds.  Neurological:     General: No focal deficit present.     Mental Status: She is alert and oriented to person, place,  and time. Mental status is at baseline.  Psychiatric:        Mood and Affect: Mood normal.        Behavior: Behavior normal.        Thought Content: Thought content normal.        Judgment: Judgment normal.           No results found for any visits on 05/23/24.     Assessment & Plan:    Routine Health Maintenance and Physical Exam Discussed health promotion and safety including diet and exercise recommendations, dental health, and injury prevention. Tobacco cessation if applicable. Seat belts, sunscreen, smoke detectors, etc.    Immunization History  Administered Date(s) Administered   HIB, Unspecified 04/06/1988   Hep B, Unspecified 04/25/1997, 05/30/1997, 11/05/1997   Influenza, Quadrivalent, Recombinant, Inj, Pf 05/07/2016, 04/29/2017, 04/08/2018, 04/20/2019   Influenza, Seasonal, Injecte, Preservative Fre 04/29/2017   Influenza,inj,Quad PF,6+ Mos 04/29/2017, 05/11/2024   Influenza-Unspecified 04/16/2022   MMR 06/23/1987   Meningococcal polysaccharide vaccine (MPSV4) 01/29/2004   PFIZER Comirnaty(Gray Top)Covid-19 Tri-Sucrose Vaccine 07/18/2019, 08/08/2019, 05/09/2020   Pfizer Covid-19 Vaccine Bivalent Booster 92yrs & up 05/14/2021   Polio, Unspecified 06/24/1986, 08/27/1986, 06/23/1987   Tdap 08/04/2007, 04/11/2018    Health Maintenance  Topic Date Due   Hepatitis C Screening  Never done   HPV VACCINES (1 - 3-dose SCDM series) 05/22/2025 (Originally 03/19/2013)   COVID-19 Vaccine (5 -  2025-26 season) 05/22/2025 (Originally 03/26/2024)   Cervical Cancer Screening (HPV/Pap Cotest)  05/08/2026   DTaP/Tdap/Td (3 - Td or Tdap) 04/11/2028   Influenza Vaccine  Completed   HIV Screening  Completed   Pneumococcal Vaccine  Aged Out   Meningococcal B Vaccine  Aged Out        Problem List Items Addressed This Visit       Active Problems   Anxiety and depression   Stable on current regimen. No longer needing clonazepam . No SI/HI.      Relevant Medications   hydrOXYzine  (VISTARIL ) 25 MG capsule   busPIRone  (BUSPAR ) 10 MG tablet   venlafaxine  XR (EFFEXOR -XR) 150 MG 24 hr capsule   Other Visit Diagnoses       Annual physical exam    -  Primary   Relevant Orders   Lipid panel   TSH     Screen for STD (sexually transmitted disease)       Relevant Orders   Cervicovaginal ancillary only   HIV Antibody (routine testing w rflx)   Hepatitis C antibody   RPR     Vaginal discharge       Relevant Orders   Cervicovaginal ancillary only     Hypertension, unspecified type       Relevant Medications   verapamil  (CALAN -SR) 180 MG CR tablet     Palpitations       Relevant Medications   verapamil  (CALAN -SR) 180 MG CR tablet          PATIENT COUNSELING:   Advised to take 1 mg of folate supplement per day if capable of pregnancy.   Recommend that most people either abstain from alcohol or drink within safe limits (<=14/week and <=4 drinks/occasion for males, <=7/weeks and <= 3 drinks/occasion for females) and that the risk for alcohol disorders and other health effects rises proportionally with the number of drinks per week and how often a drinker exceeds daily limits.   Diet: Recommend to adjust caloric intake to maintain or achieve ideal body weight, to reduce intake of dietary saturated  fat and total fat, to limit sodium intake by avoiding high sodium foods and not adding table salt, and to maintain adequate dietary potassium and calcium preferably from fresh fruits,  vegetables, and low-fat dairy products.   Emphasized the importance of regular exercise.  Injury prevention: Recommend seatbelts, safety helmets, smoke detector, etc..   Dental health: Recommend regular tooth brushing, flossing, and dental visits.       Return in about 3 months (around 08/23/2024) for chronic disease management.     Waddell KATHEE Mon, NP  I,Emily Lagle,acting as a scribe for Waddell KATHEE Mon, NP.,have documented all relevant documentation on the behalf of Waddell KATHEE Mon, NP,as directed by  Waddell KATHEE Mon, NP while in the presence of Waddell KATHEE Mon, NP.   I, Waddell KATHEE Mon, NP, have reviewed all documentation for this visit. The documentation on 05/23/2024 for the exam, diagnosis, procedures, and orders are all accurate and complete.

## 2024-05-24 LAB — HIV ANTIBODY (ROUTINE TESTING W REFLEX)
HIV 1&2 Ab, 4th Generation: NONREACTIVE
HIV FINAL INTERPRETATION: NEGATIVE

## 2024-05-24 LAB — RPR: RPR Ser Ql: NONREACTIVE

## 2024-05-24 LAB — HEPATITIS C ANTIBODY: Hepatitis C Ab: NONREACTIVE

## 2024-05-25 ENCOUNTER — Other Ambulatory Visit: Payer: Self-pay

## 2024-05-25 ENCOUNTER — Encounter: Payer: Self-pay | Admitting: Family Medicine

## 2024-05-25 ENCOUNTER — Other Ambulatory Visit (HOSPITAL_COMMUNITY): Payer: Self-pay

## 2024-05-25 DIAGNOSIS — F419 Anxiety disorder, unspecified: Secondary | ICD-10-CM

## 2024-05-25 LAB — CERVICOVAGINAL ANCILLARY ONLY
Bacterial Vaginitis (gardnerella): POSITIVE — AB
Candida Glabrata: NEGATIVE
Candida Vaginitis: POSITIVE — AB
Chlamydia: NEGATIVE
Comment: NEGATIVE
Comment: NEGATIVE
Comment: NEGATIVE
Comment: NEGATIVE
Comment: NEGATIVE
Comment: NORMAL
Neisseria Gonorrhea: NEGATIVE
Trichomonas: NEGATIVE

## 2024-05-25 MED ORDER — FLUCONAZOLE 150 MG PO TABS
150.0000 mg | ORAL_TABLET | Freq: Every day | ORAL | 0 refills | Status: DC
  Filled 2024-05-25: qty 2, 3d supply, fill #0

## 2024-05-25 MED ORDER — BUSPIRONE HCL 10 MG PO TABS
10.0000 mg | ORAL_TABLET | Freq: Two times a day (BID) | ORAL | 1 refills | Status: AC
  Filled 2024-05-25: qty 60, 30d supply, fill #0
  Filled 2024-06-25: qty 180, 90d supply, fill #1

## 2024-05-25 MED ORDER — CLINDAMYCIN PHOSPHATE 2 % VA CREA
1.0000 | TOPICAL_CREAM | Freq: Every day | VAGINAL | 0 refills | Status: AC
  Filled 2024-05-25 – 2024-05-29 (×2): qty 40, 7d supply, fill #0

## 2024-05-25 MED ORDER — VENLAFAXINE HCL ER 150 MG PO CP24
150.0000 mg | ORAL_CAPSULE | Freq: Every day | ORAL | 1 refills | Status: AC
  Filled 2024-05-25: qty 30, 30d supply, fill #0
  Filled 2024-06-25: qty 90, 90d supply, fill #1

## 2024-05-29 ENCOUNTER — Other Ambulatory Visit: Payer: Self-pay | Admitting: Family Medicine

## 2024-05-29 ENCOUNTER — Other Ambulatory Visit (HOSPITAL_COMMUNITY): Payer: Self-pay

## 2024-06-05 ENCOUNTER — Encounter: Payer: Self-pay | Admitting: Family Medicine

## 2024-06-05 ENCOUNTER — Telehealth: Payer: Self-pay | Admitting: *Deleted

## 2024-06-05 ENCOUNTER — Other Ambulatory Visit (HOSPITAL_COMMUNITY): Payer: Self-pay

## 2024-06-05 ENCOUNTER — Ambulatory Visit (INDEPENDENT_AMBULATORY_CARE_PROVIDER_SITE_OTHER): Admitting: Family Medicine

## 2024-06-05 ENCOUNTER — Other Ambulatory Visit: Payer: Self-pay

## 2024-06-05 VITALS — BP 129/90 | HR 80 | Temp 98.4°F | Ht 61.0 in | Wt 214.0 lb

## 2024-06-05 DIAGNOSIS — Z6841 Body Mass Index (BMI) 40.0 and over, adult: Secondary | ICD-10-CM | POA: Diagnosis not present

## 2024-06-05 DIAGNOSIS — E559 Vitamin D deficiency, unspecified: Secondary | ICD-10-CM

## 2024-06-05 DIAGNOSIS — R7989 Other specified abnormal findings of blood chemistry: Secondary | ICD-10-CM | POA: Diagnosis not present

## 2024-06-05 DIAGNOSIS — E88819 Insulin resistance, unspecified: Secondary | ICD-10-CM | POA: Diagnosis not present

## 2024-06-05 DIAGNOSIS — E66813 Obesity, class 3: Secondary | ICD-10-CM

## 2024-06-05 DIAGNOSIS — K909 Intestinal malabsorption, unspecified: Secondary | ICD-10-CM | POA: Diagnosis not present

## 2024-06-05 MED ORDER — VITAMIN D (ERGOCALCIFEROL) 1.25 MG (50000 UNIT) PO CAPS
50000.0000 [IU] | ORAL_CAPSULE | ORAL | 0 refills | Status: DC
Start: 2024-06-05 — End: 2024-06-06
  Filled 2024-06-05: qty 6, 84d supply, fill #0

## 2024-06-05 MED ORDER — CYANOCOBALAMIN 1000 MCG/ML IJ SOLN
1000.0000 ug | INTRAMUSCULAR | 3 refills | Status: DC
  Filled 2024-06-05 – 2024-06-26 (×2): qty 1, 30d supply, fill #0
  Filled 2024-07-22: qty 1, 30d supply, fill #1
  Filled 2024-08-21 (×2): qty 1, 30d supply, fill #0
  Filled 2024-08-21: qty 2, 60d supply, fill #0

## 2024-06-05 NOTE — Progress Notes (Signed)
 Office: [phone]  /  Fax: [phone]  WEIGHT SUMMARY AND BIOMETRICS  Starting Date: 09/13/22  Starting Weight: 229lb   Weight Lost Since Last Visit: 0lb   Vitals Temp: 98.4 F (36.9 C) BP: (!) 129/90 Pulse Rate: 80 SpO2: 98 %   Body Composition  Body Fat %: 48.7 % Fat Mass (lbs): 104.2 lbs Muscle Mass (lbs): 104.2 lbs Total Body Water (lbs): 73.4 lbs Visceral Fat Rating : 13    HPI  Chief Complaint: OBESITY  Natasha Rice is here to discuss her progress with her obesity treatment plan. She is on the the Category 3 Plan and states she is following her eating plan approximately 75-80 % of the time. She states she is exercising 30 minutes 3 times per week.  Interval History:  Since last office visit she is down 0 pounds She is down 0.8 pounds of muscle mass and down 0 pounds of body fat since last visit This gives her a net weight loss of 15 pounds in the past year and a half of medically supervised weight management She did come off Wegovy  as it was no longer covered by insurance and switch over to Victoza  taking 1.8 mg once daily injection She has seen an increase in appetite on Victoza  with more food noise She has some volume control at mealtime from her previous vertical sleeve gastrectomy done November 2017 She has a good support system and is keeping stress levels low She has some menstrual food cravings especially for sweets She has been trying to get in 30 minutes of walking on workdays  Pharmacotherapy: Victoza  1.8 mg once daily injection  PHYSICAL EXAM:  Blood pressure (!) 129/90, pulse 80, temperature 98.4 F (36.9 C), height 5' 1 (1.549 m), weight 214 lb (97.1 kg), SpO2 98%. Body mass index is 40.43 kg/m.  General: She is overweight, cooperative, alert, well developed, and in no acute distress. PSYCH: Has normal mood, affect and thought process.   Lungs: Normal breathing effort, no conversational dyspnea.   ASSESSMENT AND PLAN  TREATMENT PLAN  FOR OBESITY:  Recommended Dietary Goals  Natasha Rice is currently in the action stage of change. As such, her goal is to continue weight management plan. She has agreed to the Category 3 Plan.  Behavioral Intervention  We discussed the following Behavioral Modification Strategies today: increasing lean protein intake to established goals, increasing fiber rich foods, work on meal planning and preparation, keeping healthy foods at home, work on managing stress, creating time for self-care and relaxation, avoiding temptations and identifying enticing environmental cues, better snacking choices, and celebration eating strategies.  Additional resources provided today: NA  Recommended Physical Activity Goals  Natasha Rice has been advised to work up to 150 minutes of moderate intensity aerobic activity a week and strengthening exercises 2-3 times per week for cardiovascular health, weight loss maintenance and preservation of muscle mass.   She has agreed to [employer] strengthening exercises with a goal of 2-3 sessions a week  and Increase the intensity, frequency or duration of aerobic exercises   We discussed the importance of ramping up walking time and adding in weight training 2 to 3 days a week  Pharmacotherapy changes for the treatment of obesity: She will finish out her current prescription for Victoza  1.8 mg daily injection and consider return to Wegovy  if insurance covers  ASSOCIATED CONDITIONS ADDRESSED TODAY  Intestinal malabsorption, unspecified type She reports good compliance taking vitamin B12 1000 mcg every 30 days Energy level has improved.  She denies brain  fog or paresthesias She is on a bariatric multivitamin daily and is due for repeat labs today -     Vitamin B12 -     Folate  Vitamin D  deficiency She has been taking vitamin D  50,000 IU every other week.  Energy levels have been stable and she is due for lab repeat today -     VITAMIN D  25 Hydroxy (Vit-D Deficiency,  Fractures) -     Insulin , random -     Vitamin D  (Ergocalciferol ); Take 1 capsule (50,000 Units total) by mouth every 14 (fourteen) days.  Dispense: 6 capsule; Refill: 0  Obesity, Class III, BMI 40-49.9 (morbid obesity) (HCC) She is at risk for NASH with insulin  resistance and  morbid obesity Obtain FibroSure scan and consider restart Wegovy  if she has an F1-F3 score Continue active plan for weight reduction using a reduced calorie high-protein low carbohydrate low sugar diet and regular exercise. -     Ambulatory referral to Gastroenterology  Low vitamin B12 level -     Cyanocobalamin ; Inject 1 mL (1,000 mcg total) into the muscle every 30 (thirty) days.  Dispense: 1 mL; Refill: 3  Insulin  resistance Continue to work on reducing added sugar and refined carbohydrate intake while ramping up walking time and adding back and resistance training 2 to 3 days a week.  She is not on metformin  and is currently on Victoza  off label at 1.8 mg daily injection but is failing to see the weight loss that she did see while on Wegovy .     She was informed of the importance of frequent follow up visits to maximize her success with intensive lifestyle modifications for her multiple health conditions.   ATTESTASTION STATEMENTS:  Reviewed by clinician on day of visit: allergies, medications, problem list, medical history, surgical history, family history, social history, and previous encounter notes pertinent to obesity diagnosis.   I have personally spent 30 minutes total time today in preparation, patient care, nutritional counseling and education,  and documentation for this visit, including the following: review of most recent clinical lab tests, prescribing medications/ refilling medications, reviewing medical assistant documentation, review and interpretation of bioimpedence results.     Darice Haddock, D.O. DABFM, DABOM [facility] [address] [place], [place] [address] [phone]

## 2024-06-05 NOTE — Telephone Encounter (Signed)
 Referral and office notes sent over to [facility].

## 2024-06-06 ENCOUNTER — Other Ambulatory Visit (HOSPITAL_COMMUNITY): Payer: Self-pay

## 2024-06-06 ENCOUNTER — Ambulatory Visit: Payer: Self-pay | Admitting: Family Medicine

## 2024-06-06 DIAGNOSIS — E559 Vitamin D deficiency, unspecified: Secondary | ICD-10-CM

## 2024-06-06 LAB — VITAMIN B12: Vitamin B-12: 605 pg/mL (ref 232–1245)

## 2024-06-06 LAB — VITAMIN D 25 HYDROXY (VIT D DEFICIENCY, FRACTURES): Vit D, 25-Hydroxy: 30.8 ng/mL (ref 30.0–100.0)

## 2024-06-06 LAB — INSULIN, RANDOM: INSULIN: 6.9 u[IU]/mL (ref 2.6–24.9)

## 2024-06-06 LAB — FOLATE: Folate: 4.3 ng/mL (ref >3.0)

## 2024-06-06 MED ORDER — VITAMIN D (ERGOCALCIFEROL) 1.25 MG (50000 UNIT) PO CAPS
50000.0000 [IU] | ORAL_CAPSULE | ORAL | 0 refills | Status: DC
  Filled 2024-06-06: qty 10, 70d supply, fill #0

## 2024-06-18 ENCOUNTER — Telehealth: Admitting: Emergency Medicine

## 2024-06-18 ENCOUNTER — Other Ambulatory Visit (HOSPITAL_COMMUNITY): Payer: Self-pay

## 2024-06-18 DIAGNOSIS — B9689 Other specified bacterial agents as the cause of diseases classified elsewhere: Secondary | ICD-10-CM

## 2024-06-18 DIAGNOSIS — J208 Acute bronchitis due to other specified organisms: Secondary | ICD-10-CM

## 2024-06-18 MED ORDER — AZITHROMYCIN 250 MG PO TABS
ORAL_TABLET | ORAL | 0 refills | Status: AC
  Filled 2024-06-18: qty 6, 5d supply, fill #0

## 2024-06-18 MED ORDER — PREDNISONE 10 MG (21) PO TBPK
ORAL_TABLET | ORAL | 0 refills | Status: AC
  Filled 2024-06-18: qty 21, 6d supply, fill #0

## 2024-06-18 NOTE — Progress Notes (Signed)
 We are sorry that you are not feeling well.  Here is how we plan to help!  Based on your presentation I believe you most likely have A cough due to bacteria.  When patients have a fever and a productive cough with a change in color or increased sputum production, we are concerned about bacterial bronchitis.  If left untreated it can progress to pneumonia.  If your symptoms do not improve with your treatment plan it is important that you contact your provider.   I have prescribed Azithromyin 250 mg: two tablets now and then one tablet daily for 4 additonal days    In addition you may use A non-prescription cough medication called Mucinex  DM: take 2 tablets every 12 hours.  I have also prescribed Prednisone  5 mg daily for 6 days (see taper instructions on prescription)  From your responses in the eVisit questionnaire you describe inflammation in the upper respiratory tract which is causing a significant cough.  This is commonly called Bronchitis and has four common causes:   Allergies Viral Infections Acid Reflux Bacterial Infection Allergies, viruses and acid reflux are treated by controlling symptoms or eliminating the cause. An example might be a cough caused by taking certain blood pressure medications. You stop the cough by changing the medication. Another example might be a cough caused by acid reflux. Controlling the reflux helps control the cough.  USE OF BRONCHODILATOR (RESCUE) INHALERS: There is a risk from using your bronchodilator too frequently.  The risk is that over-reliance on a medication which only relaxes the muscles surrounding the breathing tubes can reduce the effectiveness of medications prescribed to reduce swelling and congestion of the tubes themselves.  Although you feel brief relief from the bronchodilator inhaler, your asthma may actually be worsening with the tubes becoming more swollen and filled with mucus.  This can delay other crucial treatments, such as oral steroid  medications. If you need to use a bronchodilator inhaler daily, several times per day, you should discuss this with your provider.  There are probably better treatments that could be used to keep your asthma under control.     HOME CARE Only take medications as instructed by your medical team. Complete the entire course of an antibiotic. Drink plenty of fluids and get plenty of rest. Avoid close contacts especially the very young and the elderly Cover your mouth if you cough or cough into your sleeve. Always remember to wash your hands A steam or ultrasonic humidifier can help congestion.   GET HELP RIGHT AWAY IF: You develop worsening fever. You become short of breath You cough up blood. Your symptoms persist after you have completed your treatment plan MAKE SURE YOU  Understand these instructions. Will watch your condition. Will get help right away if you are not doing well or get worse.  Your e-visit answers were reviewed by a board certified advanced clinical practitioner to complete your personal care plan.  Depending on the condition, your plan could have included both over the counter or prescription medications. If there is a problem please reply  once you have received a response from your provider. Your safety is important to us .  If you have drug allergies check your prescription carefully.    You can use MyChart to ask questions about today's visit, request a non-urgent call back, or ask for a work or school excuse for 24 hours related to this e-Visit. If it has been greater than 24 hours you will need to follow up  with your provider, or enter a new e-Visit to address those concerns. You will get an e-mail in the next two days asking about your experience.  I hope that your e-visit has been valuable and will speed your recovery. Thank you for using e-visits.   I have spent 5 minutes in review of e-visit questionnaire, review and updating patient chart, medical decision making  and response to patient.   Jon Belt, PhD, FNP-BC

## 2024-06-25 ENCOUNTER — Other Ambulatory Visit (HOSPITAL_COMMUNITY): Payer: Self-pay

## 2024-06-25 ENCOUNTER — Telehealth: Admitting: Emergency Medicine

## 2024-06-25 DIAGNOSIS — B3731 Acute candidiasis of vulva and vagina: Secondary | ICD-10-CM

## 2024-06-25 MED ORDER — FLUCONAZOLE 150 MG PO TABS
150.0000 mg | ORAL_TABLET | Freq: Every day | ORAL | 0 refills | Status: AC
  Filled 2024-06-25: qty 2, 3d supply, fill #0

## 2024-06-25 NOTE — Progress Notes (Signed)

## 2024-06-26 ENCOUNTER — Other Ambulatory Visit (HOSPITAL_COMMUNITY): Payer: Self-pay

## 2024-06-29 DIAGNOSIS — K7581 Nonalcoholic steatohepatitis (NASH): Secondary | ICD-10-CM | POA: Diagnosis not present

## 2024-07-09 ENCOUNTER — Telehealth (HOSPITAL_COMMUNITY): Payer: Self-pay

## 2024-07-09 ENCOUNTER — Encounter (HOSPITAL_COMMUNITY): Payer: Self-pay

## 2024-07-09 ENCOUNTER — Telehealth: Payer: Self-pay | Admitting: *Deleted

## 2024-07-09 ENCOUNTER — Other Ambulatory Visit (HOSPITAL_COMMUNITY): Payer: Self-pay

## 2024-07-09 ENCOUNTER — Encounter: Payer: Self-pay | Admitting: Family Medicine

## 2024-07-09 ENCOUNTER — Ambulatory Visit: Admitting: Family Medicine

## 2024-07-09 VITALS — BP 122/86 | HR 75 | Temp 98.0°F | Ht 61.0 in | Wt 222.0 lb

## 2024-07-09 DIAGNOSIS — Z9884 Bariatric surgery status: Secondary | ICD-10-CM | POA: Diagnosis not present

## 2024-07-09 DIAGNOSIS — Z6841 Body Mass Index (BMI) 40.0 and over, adult: Secondary | ICD-10-CM | POA: Diagnosis not present

## 2024-07-09 DIAGNOSIS — R7989 Other specified abnormal findings of blood chemistry: Secondary | ICD-10-CM

## 2024-07-09 DIAGNOSIS — E559 Vitamin D deficiency, unspecified: Secondary | ICD-10-CM

## 2024-07-09 DIAGNOSIS — K760 Fatty (change of) liver, not elsewhere classified: Secondary | ICD-10-CM | POA: Diagnosis not present

## 2024-07-09 MED ORDER — PHENTERMINE-TOPIRAMATE ER 7.5-46 MG PO CP24
1.0000 | ORAL_CAPSULE | Freq: Every day | ORAL | 0 refills | Status: DC
  Filled 2024-07-09 – 2024-07-23 (×3): qty 30, 30d supply, fill #0

## 2024-07-09 MED ORDER — PHENTERMINE-TOPIRAMATE ER 3.75-23 MG PO CP24
1.0000 | ORAL_CAPSULE | Freq: Every day | ORAL | 0 refills | Status: AC
  Filled 2024-07-09 – 2024-07-10 (×4): qty 14, 14d supply, fill #0

## 2024-07-09 MED ORDER — VITAMIN D (ERGOCALCIFEROL) 1.25 MG (50000 UNIT) PO CAPS
50000.0000 [IU] | ORAL_CAPSULE | ORAL | 0 refills | Status: DC
  Filled 2024-07-09: qty 12, 84d supply, fill #0

## 2024-07-09 NOTE — Progress Notes (Signed)
 Office: [phone]  /  Fax: [phone]  WEIGHT SUMMARY AND BIOMETRICS  Starting Date: 09/13/22  Starting Weight: 229lb   Weight Lost Since Last Visit: 0lb   Vitals Temp: 98 F (36.7 C) BP: 122/86 Pulse Rate: 75 SpO2: 100 %   Body Composition  Body Fat %: 49.1 % Fat Mass (lbs): 109 lbs Muscle Mass (lbs): 107.2 lbs Total Body Water (lbs): 74.8 lbs Visceral Fat Rating : 13     HPI  Chief Complaint: OBESITY  Natasha Rice is here to discuss her progress with her obesity treatment plan. She is on the the Category 3 Plan and states she is following her eating plan approximately 60 % of the time. She states she is exercising 0 minutes 0 times per week.   Interval History:  Since last office visit she is up 8 lb She ran out of Victoza  1.8 mg daily injection 4 weeks ago She was not feeling improved satiety and had a rise in food noise ever since coming off of Wegovy  (stopped due to lack of coverage this Summer) She has some volume control with VSG but is able to eat bigger portions and is snacking more between meals Has room to ramp up exercise Her net weight loss is 7 lb in 1.5 years  Pharmacotherapy: none  PHYSICAL EXAM:  Blood pressure 122/86, pulse 75, temperature 98 F (36.7 C), height 5' 1 (1.549 m), weight 222 lb (100.7 kg), SpO2 100%. Body mass index is 41.95 kg/m.  General: She is overweight, cooperative, alert, well developed, and in no acute distress. PSYCH: Has normal mood, affect and thought process.   Lungs: Normal breathing effort, no conversational dyspnea.   ASSESSMENT AND PLAN  TREATMENT PLAN FOR OBESITY:  Recommended Dietary Goals  Natasha Rice is currently in the action stage of change. As such, her goal is to continue weight management plan. She has agreed to the Category 3 Plan.  Behavioral Intervention  We discussed the following Behavioral Modification Strategies today: increasing lean protein intake to established goals, increasing  water intake , work on meal planning and preparation, keeping healthy foods at home, identifying sources and decreasing liquid calories, practice mindfulness eating and understand the difference between hunger signals and cravings, work on managing stress, creating time for self-care and relaxation, and avoiding temptations and identifying enticing environmental cues.  Additional resources provided today: NA  Recommended Physical Activity Goals  Natasha Rice has been advised to work up to 150 minutes of moderate intensity aerobic activity a week and strengthening exercises 2-3 times per week for cardiovascular health, weight loss maintenance and preservation of muscle mass.   She has agreed to Think about enjoyable ways to increase daily physical activity and overcoming barriers to exercise and Increase physical activity in their day and reduce sedentary time (increase NEAT).  Pharmacotherapy changes for the treatment of obesity: begin Phentermine / Topiramate  ER 3.75/23 mg qAM x 14 days then Phentermine / Topiramate  ER 7.5/46 mg qAM  Discussed mechanism of action and potential adverse side effects Avoid pregnancy while on Qsymia  due to risk of teratogenic side effects.  Counseled patient about contraception. Use along with dietary change and regular exercise.  ASSOCIATED CONDITIONS ADDRESSED TODAY  Hepatic steatosis Elastography done by Natasha Rice at [facility], procedure note not available Continue active plan for weight loss Limit ETOH intake Consider use of Wegovy  if covered in future  Vitamin D  deficiency Last vitamin D  Lab Results  Component Value Date   VD25OH 30.8 06/05/2024   Vitamin D  level did  drop.  Labs reviewed from last visit.  She did increase RX vitamin D  back to q 7 days.  Repeat lab in 4 mos  -     Vitamin D  (Ergocalciferol ); Take 1 capsule (50,000 Units total) by mouth every 7 (seven) days.  Dispense: 12 capsule; Refill: 0  Morbid obesity (HCC) -      Phentermine -Topiramate  ER; Take 1 capsule by mouth daily.  Dispense: 14 capsule; Refill: 0 -     Phentermine -Topiramate  ER; Take 1 capsule by mouth daily.  Dispense: 30 capsule; Refill: 0 Weight rose with an increase in appetite and cravings Continue to work on lean protein intake with meals, adhering to cat 3 meal plan, avoiding high sugar/ ultra processed foods  She is a good candidate for Qsymia .  Counseled on teratogenic SE if pregnancy occurs.  Currently abstinent and plans for condom use if needed. Upreg next visit  Patient was counseled on the importance of maintaining healthy lifestyle habits, including balanced nutrition, regular physical activity, and behavioral modifications, while taking antiobesity medication.  Patient verbalized understanding that medication is an adjunct to, not a replacement for, lifestyle changes and that the long-term success and weight maintenance depend on continued adherence to these strategies.  BMI 40.0-44.9, adult (HCC)  Low vitamin B12 level Improved Reviewed labs Continue B12 injection monthly  S/P laparoscopic sleeve gastrectomy Has some volume restriction Reviewed labs PNV as MVI daily (due to lower folate levels) + B12 injection monthly + hx of IV iron  infusions- levels improving with moderately heavy menses + RX vitamin D  weekly Drink water/ sugar free fluids outside of mealtime     She was informed of the importance of frequent follow up visits to maximize her success with intensive lifestyle modifications for her multiple health conditions.   ATTESTASTION STATEMENTS:  Reviewed by clinician on day of visit: allergies, medications, problem list, medical history, surgical history, family history, social history, and previous encounter notes pertinent to obesity diagnosis.   I have personally spent 30 minutes total time today in preparation, patient care, nutritional counseling and education,  and documentation for this visit, including  the following: review of most recent clinical lab tests, prescribing medications/ refilling medications, reviewing medical assistant documentation, review and interpretation of bioimpedence results.     Darice Haddock, D.O. DABFM, DABOM [facility] [address] [place], [place] [address] [phone]

## 2024-07-09 NOTE — Telephone Encounter (Signed)
 Pharmacy Patient Advocate Encounter   Received notification from Pt Calls Messages that prior authorization for Phentermine -Topiramate  ER 3.75-23MG  er capsules  is required/requested.   Insurance verification completed.   The patient is insured through [employer].   Per test claim:  Diethylpropion, Phendimetrazine or Phentermine  tablet/capsule is preferred by the insurance.  If suggested medication is appropriate, Please send in a new RX and discontinue this one. If not, please advise as to why it's not appropriate so that we may request a Prior Authorization. Please note, some preferred medications may still require a PA.  If the suggested medications have not been trialed and there are no contraindications to their use, the PA will not be submitted, as it will not be approved.  *This is not covered under the Medicaid plan but I could potentially try to get it approved through her primary insurance, [employer] but she will likely have a higher copay than her medicaid copay. Please advise on what you would to do.

## 2024-07-09 NOTE — Telephone Encounter (Signed)
 She has [employer] per our records.

## 2024-07-09 NOTE — Telephone Encounter (Signed)
 Prior authorization done via cover my meds for patients Phentermine -topiramate  ER 3.75. Waiting on determination.

## 2024-07-09 NOTE — Telephone Encounter (Signed)
 PA request has been Received. New Encounter has been or will be created for follow up. For additional info see Pharmacy Prior Auth telephone encounter from 07/09/24.

## 2024-07-10 ENCOUNTER — Other Ambulatory Visit (HOSPITAL_COMMUNITY): Payer: Self-pay

## 2024-07-10 NOTE — Telephone Encounter (Signed)
 Prior authorization approved for patients Qsymia .  Approved on December 15 by [employer] 2017 The request has been approved. The authorization is effective from 07/09/2024 to 07/23/2024, as long as the member is enrolled in their current health plan. The request was approved as submitted. This request is approved for 1 tablet per day. A written notification letter will follow with additional details.

## 2024-07-17 ENCOUNTER — Other Ambulatory Visit (HOSPITAL_COMMUNITY): Payer: Self-pay

## 2024-07-23 ENCOUNTER — Other Ambulatory Visit: Payer: Self-pay

## 2024-07-23 ENCOUNTER — Other Ambulatory Visit (HOSPITAL_COMMUNITY): Payer: Self-pay

## 2024-07-24 ENCOUNTER — Other Ambulatory Visit (HOSPITAL_COMMUNITY): Payer: Self-pay

## 2024-07-28 ENCOUNTER — Other Ambulatory Visit (HOSPITAL_COMMUNITY): Payer: Self-pay

## 2024-07-30 ENCOUNTER — Other Ambulatory Visit (HOSPITAL_COMMUNITY): Payer: Self-pay

## 2024-07-30 ENCOUNTER — Other Ambulatory Visit: Payer: Self-pay

## 2024-08-05 ENCOUNTER — Telehealth: Admitting: Family

## 2024-08-05 DIAGNOSIS — K120 Recurrent oral aphthae: Secondary | ICD-10-CM

## 2024-08-05 MED ORDER — TRIAMCINOLONE ACETONIDE 0.1 % MT PSTE
1.0000 | PASTE | Freq: Two times a day (BID) | OROMUCOSAL | 12 refills | Status: AC
  Filled 2024-08-05: qty 5, 30d supply, fill #0

## 2024-08-05 NOTE — Progress Notes (Signed)
 E-Visit for Mouth Ulcers  We are sorry that you are not feeling well.  Here is how we plan to help!  Based on what you have shared with me, it appears that you do have mouth ulcer(s).     The following medications should decrease the discomfort and help with healing. Biotene mouthwash 3 times daily (Available over the counter) and Triamcinolone  Dental Paste apply 3 times daily as needed for up to 3 days   Mouth ulcers are painful areas in the mouth and gums. These are also known as canker sores.  They can occur anywhere inside the mouth. While mostly harmless, mouth ulcers can be extremely uncomfortable and may make it difficult to eat, drink, and brush your teeth.  You may have more than 1 ulcer and they can vary and change in size. Mouth ulcers are not contagious and should not be confused with cold sores.  Cold sores appear on the lip or around the outside of the mouth and often begin with a tingling, burning or itching sensation.   While the exact causes are unknown, some common causes and factors that may aggravate mouth ulcers include: Genetics - Sometimes mouth ulcers run in families High alcohol intake Acidic foods such as citrus fruits like pineapple, grapefruit, orange fruits/juices, may aggravate mouth ulcers Other foods high in acidity or spice such as coffee, chocolate, chips, pretzels, eggs, nuts, cheese Quitting smoking Injury caused by biting the tongue or inside of the cheek Diet lacking in B-12, zinc , folic acid  or iron  Female hormone shifts with menstruation Excessive fatigue, emotional stress or anxiety Prevention: Talk to your doctor if you are taking meds that are known to cause mouth ulcers such as:   Anti-inflammatory drugs (for example Ibuprofen , Naproxen  sodium), pain killers, Beta blockers, Oral nicotine replacement drugs, Some street drugs (heroin).   Avoid allowing any tablets to dissolve in your mouth that are meant to swallowed whole Avoid foods/drinks that  trigger or worsen symptoms Keep your mouth clean with daily brushing and flossing  Home Care: The goal with treatment is to ease the pain where ulcers occur and help them heal as quickly as possible.  There is no medical treatment to prevent mouth ulcers from coming back or recurring.  Avoid spicy and acidic foods Eat soft foods and avoid rough, crunchy foods Avoid chewing gum Do not use toothpaste that contains sodium lauryl sulphite Use a straw to drink which helps avoid liquids toughing the ulcers near the front of your mouth Use a very soft toothbrush If you have dentures or dental hardware that you feel is not fitting well or contributing to his, please see your dentist. Use saltwater mouthwash which helps healing. Dissolve a  teaspoon of salt in a glass of warm water. Swish around your mouth and spit it out. This can be used as needed if it is soothing.   GET HELP RIGHT AWAY IF: Persistent ulcers require checking IN PERSON (face to face). Any mouth lesion lasting longer than a month should be seen by your DENTIST as soon as possible for evaluation for possible oral cancer. If you have a non-painful ulcer in 1 or more areas of your mouth Ulcers that are spreading, are very large or particularly painful Ulcers last longer than one week without improving on treatment If you develop a fever, swollen glands and begin to feel unwell Ulcers that developed after starting a new medication MAKE SURE YOU: Understand these instructions. Will watch your condition. Will get help right  away if you are not doing well or get worse.  Thank you for choosing an e-visit.  Your e-visit answers were reviewed by a board certified advanced clinical practitioner to complete your personal care plan. Depending upon the condition, your plan could have included both over the counter or prescription medications.  Please review your pharmacy choice. Make sure the pharmacy is open so you can pick up prescription  now. If there is a problem, you may contact your provider through [employer] and have the prescription routed to another pharmacy.  Your safety is important to us . If you have drug allergies check your prescription carefully.   For the next 24 hours you can use MyChart to ask questions about today's visit, request a non-urgent call back, or ask for a work or school excuse. You will get an email in the next two days asking about your experience. I hope that your e-visit has been valuable and will speed your recovery.  I have spent 5 minutes in review of e-visit questionnaire, review and updating patient chart, medical decision making and response to patient.   Bari Learn, FNP

## 2024-08-06 ENCOUNTER — Other Ambulatory Visit (HOSPITAL_COMMUNITY): Payer: Self-pay

## 2024-08-09 ENCOUNTER — Ambulatory Visit: Admitting: Family Medicine

## 2024-08-09 ENCOUNTER — Telehealth: Payer: Self-pay | Admitting: *Deleted

## 2024-08-09 ENCOUNTER — Other Ambulatory Visit (HOSPITAL_COMMUNITY): Payer: Self-pay

## 2024-08-09 ENCOUNTER — Other Ambulatory Visit: Payer: Self-pay

## 2024-08-09 ENCOUNTER — Encounter: Payer: Self-pay | Admitting: Family Medicine

## 2024-08-09 VITALS — BP 121/83 | HR 97 | Ht 61.0 in | Wt 227.0 lb

## 2024-08-09 DIAGNOSIS — Z9884 Bariatric surgery status: Secondary | ICD-10-CM

## 2024-08-09 DIAGNOSIS — Z6841 Body Mass Index (BMI) 40.0 and over, adult: Secondary | ICD-10-CM | POA: Diagnosis not present

## 2024-08-09 DIAGNOSIS — R7989 Other specified abnormal findings of blood chemistry: Secondary | ICD-10-CM

## 2024-08-09 DIAGNOSIS — E559 Vitamin D deficiency, unspecified: Secondary | ICD-10-CM | POA: Diagnosis not present

## 2024-08-09 MED ORDER — PHENTERMINE-TOPIRAMATE ER 7.5-46 MG PO CP24
1.0000 | ORAL_CAPSULE | Freq: Every day | ORAL | 0 refills | Status: AC
  Filled 2024-08-09 – 2024-08-21 (×3): qty 30, 30d supply, fill #0

## 2024-08-09 MED ORDER — VITAMIN D (ERGOCALCIFEROL) 1.25 MG (50000 UNIT) PO CAPS
50000.0000 [IU] | ORAL_CAPSULE | ORAL | 0 refills | Status: AC
  Filled 2024-08-09: qty 12, 84d supply, fill #0

## 2024-08-09 NOTE — Telephone Encounter (Signed)
 Phone call to [facility] to get the results of the Fibro scan patient had done. Per Luke at [facility] I had to submit a form to get the records. Faxed over form waiting to hear back from them.

## 2024-08-09 NOTE — Progress Notes (Signed)
 "  Office: [phone]  /  Fax: [phone]  WEIGHT SUMMARY AND BIOMETRICS  Starting Date: 09/13/22  Starting Weight: 229lb   Weight Lost Since Last Visit: 0lb   Vitals BP: 121/83 Pulse Rate: 97 SpO2: 98 %   Body Composition  Body Fat %: 50.6 % Fat Mass (lbs): 115.2 lbs Muscle Mass (lbs): 106.8 lbs Total Body Water (lbs): 77.2 lbs Visceral Fat Rating : 14     HPI  Chief Complaint: OBESITY  Natasha Rice is here to discuss her progress with her obesity treatment plan. She is on the the Category 3 Plan and states she is following her eating plan approximately 30 % of the time. She states she is exercising 30 minutes 3 times per week.   Interval History:  Since last office visit she is up 5 lb She is doing well on Qsymia  now on 7.5/46 mg daily with improved appetite control Has some dry mouth She is walking more at work and started back to the gym this last week She is drinking more water She is working on improving sleep Energy level has improved following IV iron  infusion last year and B12 injections She has a net weight loss of 2 lb in 22 mos (had a surrogate pregnancy in 2024)  Pharmacotherapy: Phentermine / topiramate  ER 7.5/46 mg daily  PHYSICAL EXAM:  Blood pressure 121/83, pulse 97, height 5' 1 (1.549 m), weight 227 lb (103 kg), SpO2 98%. Body mass index is 42.89 kg/m.  General: She is overweight, cooperative, alert, well developed, and in no acute distress. PSYCH: Has normal mood, affect and thought process.   Lungs: Normal breathing effort, no conversational dyspnea.   ASSESSMENT AND PLAN  TREATMENT PLAN FOR OBESITY:  Recommended Dietary Goals  [name] is currently in the action stage of change. As such, her goal is to continue weight management plan. She has agreed to the Category 3 Plan.  Behavioral Intervention  We discussed the following Behavioral Modification Strategies today: increasing lean protein intake to established goals,  increasing fiber rich foods, increasing water intake , work on meal planning and preparation, keeping healthy foods at home, avoiding temptations and identifying enticing environmental cues, continue to practice mindfulness when eating, planning for success, and getting back on track after recent lapse.  Additional resources provided today: NA  Recommended Physical Activity Goals  [name] has been advised to work up to 150 minutes of moderate intensity aerobic activity a week and strengthening exercises 2-3 times per week for cardiovascular health, weight loss maintenance and preservation of muscle mass.   She has agreed to Increase the intensity, frequency or duration of strengthening exercises  and Increase the intensity, frequency or duration of aerobic exercises   Aim for > 8,000 steps/ day and 3 gym workouts per week  Pharmacotherapy changes for the treatment of obesity: none  ASSOCIATED CONDITIONS ADDRESSED TODAY  Low vitamin B12 level Lab Results  Component Value Date   VITAMINB12 605 06/05/2024   Doing well on B12 injections once weekly If B12 level not checked at upcoming hematology visit, will repeat lab in 2 mos  Vitamin D  deficiency -     Vitamin D  (Ergocalciferol ); Take 1 capsule (50,000 Units total) by mouth every 7 (seven) days.  Dispense: 12 capsule; Refill: 0 Last vitamin D  Lab Results  Component Value Date   VD25OH 30.8 06/05/2024   Doing well on RX vitamin D  once weekly Energy level improving Repeat lab in 2 mos  Morbid obesity (HCC) -  Phentermine -Topiramate  ER; Take 1 capsule by mouth daily.  Dispense: 30 capsule; Refill: 0 BP/ HR at goal on generic Qsymia  dose 2 each day with improved satiety and a reduction of cravings.  Agrees to abstinence + condoms for birth control.  Using Linzess  daily for constipation.    Patient was counseled on the importance of maintaining healthy lifestyle habits, including balanced nutrition, regular physical activity, and  behavioral modifications, while taking antiobesity medication.  Patient verbalized understanding that medication is an adjunct to, not a replacement for, lifestyle changes and that the long-term success and weight maintenance depend on continued adherence to these strategies.  BMI 40.0-44.9, adult (HCC)  S/P laparoscopic sleeve gastrectomy She is back at her pre op weight s/p VSG Nov 2017 with Dr Hope.  Her nadir weight was 154 lb.  Continue to work on a reduced kcal HPLC diet, cardio and resistance training on a consistent basis and continue a MVI, along with B12 injections months and RX vitamin D  weekly.    Daily protein target: 80-90 g / day     She was informed of the importance of frequent follow up visits to maximize her success with intensive lifestyle modifications for her multiple health conditions.   ATTESTASTION STATEMENTS:  Reviewed by clinician on day of visit: allergies, medications, problem list, medical history, surgical history, family history, social history, and previous encounter notes pertinent to obesity diagnosis.   I personally spent a total of 30 minutes in the care of the patient today including preparing to see the patient, getting/reviewing separately obtained history, performing a medically appropriate exam/evaluation, counseling and educating, placing orders, and documenting clinical information in the EHR.    Darice Haddock, D.O. DABFM, DABOM [facility] [address] [place], [place] [address] [phone] "

## 2024-08-10 ENCOUNTER — Other Ambulatory Visit: Payer: Self-pay

## 2024-08-10 ENCOUNTER — Telehealth: Payer: Self-pay | Admitting: Physician Assistant

## 2024-08-10 ENCOUNTER — Ambulatory Visit: Admitting: Physician Assistant

## 2024-08-10 ENCOUNTER — Inpatient Hospital Stay: Attending: Hematology and Oncology

## 2024-08-10 DIAGNOSIS — Z810 Family history of intellectual disabilities: Secondary | ICD-10-CM | POA: Insufficient documentation

## 2024-08-10 DIAGNOSIS — Z818 Family history of other mental and behavioral disorders: Secondary | ICD-10-CM | POA: Insufficient documentation

## 2024-08-10 DIAGNOSIS — D563 Thalassemia minor: Secondary | ICD-10-CM | POA: Insufficient documentation

## 2024-08-10 DIAGNOSIS — Z83438 Family history of other disorder of lipoprotein metabolism and other lipidemia: Secondary | ICD-10-CM | POA: Insufficient documentation

## 2024-08-10 DIAGNOSIS — Z823 Family history of stroke: Secondary | ICD-10-CM | POA: Insufficient documentation

## 2024-08-10 DIAGNOSIS — Z6372 Alcoholism and drug addiction in family: Secondary | ICD-10-CM | POA: Insufficient documentation

## 2024-08-10 DIAGNOSIS — Z811 Family history of alcohol abuse and dependence: Secondary | ICD-10-CM | POA: Insufficient documentation

## 2024-08-10 DIAGNOSIS — F419 Anxiety disorder, unspecified: Secondary | ICD-10-CM | POA: Insufficient documentation

## 2024-08-10 DIAGNOSIS — Z8261 Family history of arthritis: Secondary | ICD-10-CM | POA: Insufficient documentation

## 2024-08-10 DIAGNOSIS — Z813 Family history of other psychoactive substance abuse and dependence: Secondary | ICD-10-CM | POA: Insufficient documentation

## 2024-08-10 DIAGNOSIS — Z79899 Other long term (current) drug therapy: Secondary | ICD-10-CM | POA: Insufficient documentation

## 2024-08-10 DIAGNOSIS — Z9884 Bariatric surgery status: Secondary | ICD-10-CM | POA: Insufficient documentation

## 2024-08-10 DIAGNOSIS — D500 Iron deficiency anemia secondary to blood loss (chronic): Secondary | ICD-10-CM

## 2024-08-10 DIAGNOSIS — N920 Excessive and frequent menstruation with regular cycle: Secondary | ICD-10-CM | POA: Insufficient documentation

## 2024-08-10 DIAGNOSIS — I10 Essential (primary) hypertension: Secondary | ICD-10-CM | POA: Insufficient documentation

## 2024-08-10 DIAGNOSIS — Z9089 Acquired absence of other organs: Secondary | ICD-10-CM | POA: Insufficient documentation

## 2024-08-10 DIAGNOSIS — Z825 Family history of asthma and other chronic lower respiratory diseases: Secondary | ICD-10-CM | POA: Insufficient documentation

## 2024-08-10 DIAGNOSIS — Z833 Family history of diabetes mellitus: Secondary | ICD-10-CM | POA: Insufficient documentation

## 2024-08-10 DIAGNOSIS — Z8249 Family history of ischemic heart disease and other diseases of the circulatory system: Secondary | ICD-10-CM | POA: Insufficient documentation

## 2024-08-10 DIAGNOSIS — F32A Depression, unspecified: Secondary | ICD-10-CM | POA: Insufficient documentation

## 2024-08-10 LAB — CBC WITH DIFFERENTIAL (CANCER CENTER ONLY)
Abs Immature Granulocytes: 0.01 K/uL (ref 0.00–0.07)
Basophils Absolute: 0 K/uL (ref 0.0–0.1)
Basophils Relative: 1 %
Eosinophils Absolute: 0.1 K/uL (ref 0.0–0.5)
Eosinophils Relative: 1 %
HCT: 39.4 % (ref 36.0–46.0)
Hemoglobin: 12.4 g/dL (ref 12.0–15.0)
Immature Granulocytes: 0 %
Lymphocytes Relative: 26 %
Lymphs Abs: 1.3 K/uL (ref 0.7–4.0)
MCH: 23.2 pg — ABNORMAL LOW (ref 26.0–34.0)
MCHC: 31.5 g/dL (ref 30.0–36.0)
MCV: 73.8 fL — ABNORMAL LOW (ref 80.0–100.0)
Monocytes Absolute: 0.5 K/uL (ref 0.1–1.0)
Monocytes Relative: 9 %
Neutro Abs: 3.1 K/uL (ref 1.7–7.7)
Neutrophils Relative %: 63 %
Platelet Count: 240 K/uL (ref 150–400)
RBC: 5.34 MIL/uL — ABNORMAL HIGH (ref 3.87–5.11)
RDW: 15.3 % (ref 11.5–15.5)
WBC Count: 5 K/uL (ref 4.0–10.5)
nRBC: 0 % (ref 0.0–0.2)

## 2024-08-10 LAB — CMP (CANCER CENTER ONLY)
ALT: 12 U/L (ref 0–44)
AST: 15 U/L (ref 15–41)
Albumin: 4.1 g/dL (ref 3.5–5.0)
Alkaline Phosphatase: 76 U/L (ref 38–126)
Anion gap: 8 (ref 5–15)
BUN: 13 mg/dL (ref 6–20)
CO2: 25 mmol/L (ref 22–32)
Calcium: 9.2 mg/dL (ref 8.9–10.3)
Chloride: 107 mmol/L (ref 98–111)
Creatinine: 0.84 mg/dL (ref 0.44–1.00)
GFR, Estimated: >60 mL/min
Glucose, Bld: 94 mg/dL (ref 70–99)
Potassium: 4.4 mmol/L (ref 3.5–5.1)
Sodium: 139 mmol/L (ref 135–145)
Total Bilirubin: 0.4 mg/dL (ref 0.0–1.2)
Total Protein: 6.9 g/dL (ref 6.5–8.1)

## 2024-08-10 LAB — IRON AND IRON BINDING CAPACITY (CC-WL,HP ONLY)
Iron: 74 ug/dL (ref 28–170)
Saturation Ratios: 23 % (ref 10.4–31.8)
TIBC: 315 ug/dL (ref 250–450)
UIBC: 241 ug/dL

## 2024-08-10 LAB — FERRITIN: Ferritin: 142 ng/mL (ref 11–307)

## 2024-08-14 ENCOUNTER — Inpatient Hospital Stay: Admitting: Physician Assistant

## 2024-08-15 ENCOUNTER — Telehealth: Payer: Self-pay | Admitting: Physician Assistant

## 2024-08-15 NOTE — Telephone Encounter (Signed)
 "  Patient called to reschedule appt.   "

## 2024-08-17 ENCOUNTER — Other Ambulatory Visit (HOSPITAL_COMMUNITY): Payer: Self-pay

## 2024-08-17 ENCOUNTER — Other Ambulatory Visit: Payer: Self-pay

## 2024-08-17 ENCOUNTER — Inpatient Hospital Stay: Admitting: Physician Assistant

## 2024-08-17 ENCOUNTER — Ambulatory Visit: Admitting: Physician Assistant

## 2024-08-21 ENCOUNTER — Other Ambulatory Visit (HOSPITAL_COMMUNITY): Payer: Self-pay

## 2024-08-21 ENCOUNTER — Inpatient Hospital Stay: Admitting: Physician Assistant

## 2024-08-21 ENCOUNTER — Other Ambulatory Visit: Payer: Self-pay | Admitting: Family Medicine

## 2024-08-21 DIAGNOSIS — R7989 Other specified abnormal findings of blood chemistry: Secondary | ICD-10-CM

## 2024-08-21 DIAGNOSIS — D563 Thalassemia minor: Secondary | ICD-10-CM

## 2024-08-21 DIAGNOSIS — D500 Iron deficiency anemia secondary to blood loss (chronic): Secondary | ICD-10-CM | POA: Diagnosis not present

## 2024-08-21 MED ORDER — CYANOCOBALAMIN 1000 MCG/ML IJ SOLN
1000.0000 ug | INTRAMUSCULAR | 1 refills | Status: AC
  Filled 2024-08-21 (×2): qty 3, 90d supply, fill #0

## 2024-08-21 NOTE — Progress Notes (Signed)
 " [facility] [phone]   [phone]  PROGRESS NOTE  Patient Care Team: Almarie Waddell NOVAK, NP as PCP - General (Family Medicine) Ladona Heinz, MD as PCP - Cardiology (Cardiology) Tobie Karole SAILOR., MD (Obstetrics and Gynecology)  Hematological/Oncological History # Iron  Deficiency Anemia in Setting of Pregnancy   07/16/2022: establish care with Dr. Federico. Hgb 11.1, Sat 8% with ferritin of 35.  08/02/2022 to 08/09/2022: received 2 doses of IV feraheme 510 mg 05/24/2023--07/01/2023: Received 5 doses of IV Venofer  200 mg weekly.  Complicated by IV iron  infiltration in the arm.  Interval History:  Natasha Rice 39 y.o. female with medical history significant for iron  deficiency anemia in pregnancy who presents for a follow up visit. The patient's last visit was on 02/10/2024. In the interim since the last visit she denies any changes to her health.  On exam today [name] reports she is feeling well with improved energy levels.  She continues to complete her ADLs on her own.  She reports that since discontinuing birth control in February 2025, # her menstrual cycles have been monthly lasting 6 days with 2 days of heavy bleeding.  She is under the care of gynecologist and discussing interventions to help improve her menstrual cycles.  She has no other signs of bleeding.  She reports that her hands and feet get easily cold.  She denies fevers, chills, night sweats, shortness of breath, chest pain, cough, headaches, dizziness, ice cravings.  She has no other complaints. A full 10 point ROS is otherwise negative.  MEDICAL HISTORY:  Past Medical History:  Diagnosis Date   Allergy    Anemia    Anxiety    Constipation    Depression    GERD (gastroesophageal reflux disease)    Hypertension    Polycystic ovarian disease    Seasonal allergies    Sleep apnea    Vitamin D  deficiency     SURGICAL HISTORY: Past Surgical History:  Procedure Laterality Date    CESAREAN SECTION     GASTRIC BYPASS     TONSILLECTOMY      SOCIAL HISTORY: Social History   Socioeconomic History   Marital status: Single    Spouse name: Not on file   Number of children: Not on file   Years of education: Not on file   Highest education level: Associate degree: occupational, scientist, product/process development, or vocational program  Occupational History   Not on file  Tobacco Use   Smoking status: Never   Smokeless tobacco: Never  Vaping Use   Vaping status: Never Used  Substance and Sexual Activity   Alcohol use: Not Currently    Alcohol/week: 2.0 standard drinks of alcohol    Types: 2 Glasses of wine per week    Comment: Occ   Drug use: No   Sexual activity: Yes    Birth control/protection: Condom  Other Topics Concern   Not on file  Social History Narrative   Not on file   Social Drivers of Health   Tobacco Use: Low Risk (08/09/2024)   Patient History    Smoking Tobacco Use: Never    Smokeless Tobacco Use: Never    Passive Exposure: Not on file  Financial Resource Strain: Low Risk (06/12/2024)   Received from [facility]   Overall Financial Resource Strain (CARDIA)    How hard is it for you to pay for the very basics like food, housing, medical care, and heating?: Not hard at all  Food Insecurity:  No Food Insecurity (06/12/2024)   Received from [facility]   Epic    Within the past 12 months, you worried that your food would run out before you got the money to buy more.: Never true    Within the past 12 months, the food you bought just didn't last and you didn't have money to get more.: Never true  Transportation Needs: No Transportation Needs (06/12/2024)   Received from [facility]    In the past 12 months, has lack of transportation kept you from medical appointments or from getting medications?: No    In the past 12 months, has lack of transportation kept you from meetings, work, or from getting things needed for daily living?: No  Physical Activity:  Insufficiently Active (06/12/2024)   Received from [facility]   Exercise Vital Sign    On average, how many days per week do you engage in moderate to strenuous exercise (like a brisk walk)?: 2 days    On average, how many minutes do you engage in exercise at this level?: 20 min  Stress: Stress Concern Present (06/12/2024)   [facility] - Occupational Stress Questionnaire    Do you feel stress - tense, restless, nervous, or anxious, or unable to sleep at night because your mind is troubled all the time - these days?: To some extent  Social Connections: Socially Integrated (06/12/2024)   Received from [facility]   Social Network    How would you rate your social network (family, work, friends)?: Good participation with social networks  Recent Concern: Social Connections - Moderately Isolated (05/16/2024)   Social Connection and Isolation Panel    Frequency of Communication with Friends and Family: More than three times a week    Frequency of Social Gatherings with Friends and Family: Three times a week    Attends Religious Services: 1 to 4 times per year    Active Member of Clubs or Organizations: No    Attends Banker Meetings: Not on file    Marital Status: Never married  Intimate Partner Violence: Not At Risk (06/12/2024)   Received from [facility]   HITS    Over the last 12 months how often did your partner physically hurt you?: Never    Over the last 12 months how often did your partner insult you or talk down to you?: Never    Over the last 12 months how often did your partner threaten you with physical harm?: Never    Over the last 12 months how often did your partner scream or curse at you?: Never  Depression (PHQ2-9): Medium Risk (05/23/2024)   Depression (PHQ2-9)    PHQ-2 Score: 10  Alcohol Screen: Low Risk (05/16/2024)   Alcohol Screen    Last Alcohol Screening Score (AUDIT): 2  Housing:  Unknown (06/12/2024)   Received from [facility]   Epic    Unable to Pay for Housing in the Last Year: Not on file    In the past 12 months, how many times have you moved where you were living?: 0    Homeless in the Last Year: Not on file  Utilities: Not At Risk (06/12/2024)   Received from [facility]    In the past 12 months has the electric, gas, oil, or water company threatened to shut off services in your home?: No  Health Literacy: Not on file  FAMILY HISTORY: Family History  Problem Relation Age of Onset   Stroke Mother    Heart disease Mother    Drug abuse Mother    Alcohol abuse Mother    Depression Mother    Bipolar disorder Mother    Alcoholism Mother    Alcoholism Father    Drug abuse Father    Diabetes Father    Hypertension Father    Hyperlipidemia Father    Schizophrenia Father    Miscarriages / Stillbirths Sister    Hypertension Sister    Early death Sister    Intellectual disability Maternal Grandmother    Arthritis Maternal Grandmother    Hypertension Maternal Grandmother    Intellectual disability Maternal Grandfather    Hypertension Maternal Grandfather    COPD Maternal Grandfather    Alcohol abuse Maternal Grandfather    Heart attack Maternal Grandfather    Diabetes Paternal Grandmother    Arthritis Paternal Grandmother    Hypertension Paternal Grandmother    Intellectual disability Paternal Grandmother    Dementia Paternal Grandmother    Alcohol abuse Paternal Grandfather    Intellectual disability Paternal Grandfather    Congestive Heart Failure Son     ALLERGIES:  is allergic to tape.  MEDICATIONS:  Current Outpatient Medications  Medication Sig Dispense Refill   busPIRone  (BUSPAR ) 10 MG tablet Take 1 tablet (10 mg total) by mouth 2 (two) times daily. 180 tablet 1   cyanocobalamin  (VITAMIN B12) 1000 MCG/ML injection Inject 1 mL (1,000 mcg total) into the muscle every 30 (thirty) days. 1 mL 3   fluconazole  (DIFLUCAN ) 150 MG  tablet Take 1 tablet (150 mg total) by mouth daily. May repeat in 3 days if needed. 2 tablet 0   hydrOXYzine  (VISTARIL ) 25 MG capsule Take 1 capsule (25 mg total) by mouth every 8 (eight) hours as needed for anxiety. (to help with sleep) 90 capsule 0   linaclotide  (LINZESS ) 290 MCG CAPS capsule Take 1 capsule (290 mcg total) by mouth daily before breakfast. 90 capsule 0   loratadine (CLARITIN) 10 MG tablet Take by mouth as needed.     Phentermine -Topiramate  ER 3.75-23 MG CP24 Take 1 capsule by mouth daily. 14 capsule 0   Phentermine -Topiramate  ER 7.5-46 MG CP24 Take 1 capsule by mouth daily. 30 capsule 0   triamcinolone  (KENALOG ) 0.1 % paste Use as directed 1 Application in the mouth or throat 2 (two) times daily. 5 g 12   valACYclovir  (VALTREX ) 500 MG tablet Take 500 mg by mouth as needed.     valACYclovir  (VALTREX ) 500 MG tablet Take 1 tablet (500 mg total) by mouth 2 (two) times daily for 3 days as needed. 30 tablet 0   venlafaxine  XR (EFFEXOR -XR) 150 MG 24 hr capsule Take 1 capsule (150 mg total) by mouth daily. 90 capsule 1   verapamil  (CALAN -SR) 180 MG CR tablet Take 1 tablet (180 mg total) by mouth at bedtime. 90 tablet 3   Vitamin D , Ergocalciferol , (DRISDOL ) 1.25 MG (50000 UNIT) CAPS capsule Take 1 capsule (50,000 Units total) by mouth every 7 (seven) days. 12 capsule 0   No current facility-administered medications for this visit.    REVIEW OF SYSTEMS:   Constitutional: ( - ) fevers, ( - )  chills , ( - ) night sweats Eyes: ( - ) blurriness of vision, ( - ) double vision, ( - ) watery eyes Ears, nose, mouth, throat, and face: ( - ) mucositis, ( - ) sore throat Respiratory: ( - ) cough, ( - )  dyspnea, ( - ) wheezes Cardiovascular: ( - ) palpitation, ( - ) chest discomfort, ( - ) lower extremity swelling Gastrointestinal:  ( - ) nausea, ( - ) heartburn, ( - ) change in bowel habits Skin: ( - ) abnormal skin rashes Lymphatics: ( - ) new lymphadenopathy, ( - ) easy  bruising Neurological: ( - ) numbness, ( - ) tingling, ( - ) new weaknesses Behavioral/Psych: ( - ) mood change, ( - ) new changes  All other systems were reviewed with the patient and are negative.  PHYSICAL EXAMINATION: Not performed due to virtual visit.   LABORATORY DATA:  I have reviewed the data as listed    Latest Ref Rng & Units 08/10/2024    8:12 AM 05/11/2024    8:02 AM 02/03/2024    7:30 AM  CBC  WBC 4.0 - 10.5 K/uL 5.0  5.3  4.4   Hemoglobin 12.0 - 15.0 g/dL 87.5  87.5  87.0   Hematocrit 36.0 - 46.0 % 39.4  38.3  40.9   Platelets 150 - 400 K/uL 240  250  255        Latest Ref Rng & Units 08/10/2024    8:12 AM 05/11/2024    8:02 AM 02/03/2024    7:30 AM  CMP  Glucose 70 - 99 mg/dL 94  85  895   BUN 6 - 20 mg/dL 13  14  14    Creatinine 0.44 - 1.00 mg/dL 9.15  9.27  9.16   Sodium 135 - 145 mmol/L 139  138  136   Potassium 3.5 - 5.1 mmol/L 4.4  4.6  4.2   Chloride 98 - 111 mmol/L 107  106  102   CO2 22 - 32 mmol/L 25  29  27    Calcium 8.9 - 10.3 mg/dL 9.2  9.3  9.1   Total Protein 6.5 - 8.1 g/dL 6.9  6.9  7.0   Total Bilirubin 0.0 - 1.2 mg/dL 0.4  0.5  0.5   Alkaline Phos 38 - 126 U/L 76  71  75   AST 15 - 41 U/L 15  13  11    ALT 0 - 44 U/L 12  11  9     RADIOGRAPHIC STUDIES: No results found.  ASSESSMENT & PLAN MARYCLARE NYDAM is a 39 y.o. female with medical history significant for iron  deficiency anemia in pregnancy who presents for a follow up visit.  # Iron  Deficiency Anemia in Setting of Pregnancy   # Alpha Thalassemia Minor  -- Findings are consistent with iron  deficiency anemia secondary to patient's menorrhagia --Found to have alpha thalassemia trait. --Encouraged her to follow-up with OB/GYN for better control of her menstrual cycles following her delivery. --Patient unable to tolerate ferrous sulfate --Last received IV venofer  200 mg x 5 doses from 05/24/2023-07/01/2023. PLAN: --labs from 08/10/2024 reviewed with patient. No evidence of anemia  with Hgb 12.4. Iron  panel shows no deficiency with ferritin 142, saturation 23%, TIBC 315.  --No need for additional IV iron  at this time --RTC in 6 months for labs/follow up  No orders of the defined types were placed in this encounter.  All questions were answered. The patient knows to call the clinic with any problems, questions or concerns.   I have spent a total of 25 minutes minutes of non-face-to-face time, preparing to see the patient, counseling and educating the patient,documenting clinical information in the electronic health record, independently interpreting results and communicating results to the patient, and care  coordination.   Johnston Police PA-C Dept of Hematology and Oncology [facility] Phone: [phone]   08/21/2024 9:20 AM  "

## 2024-08-23 ENCOUNTER — Ambulatory Visit: Admitting: Family Medicine

## 2024-08-23 ENCOUNTER — Telehealth: Payer: Self-pay | Admitting: Physician Assistant

## 2024-08-23 ENCOUNTER — Ambulatory Visit: Admitting: Physician Assistant

## 2024-08-23 NOTE — Telephone Encounter (Signed)
 I called pt to schedule appt per los

## 2024-08-30 ENCOUNTER — Telehealth: Admitting: Physician Assistant

## 2024-08-30 DIAGNOSIS — R0602 Shortness of breath: Secondary | ICD-10-CM

## 2024-08-30 NOTE — Progress Notes (Signed)
" °  Because of shortness of breath with these symptoms, I feel your condition warrants further evaluation and I recommend that you be seen in a face-to-face visit.   NOTE: There will be NO CHARGE for this E-Visit   If you are having a true medical emergency, please call 911.     For an urgent face to face visit, [place] has multiple urgent care centers for your convenience.  Click the link below for the full list of locations and hours, walk-in wait times, appointment scheduling options and driving directions:  Urgent Care - [place], [place], [place], [place], [place], [place]  [place]     Your MyChart E-visit questionnaire answers were reviewed by a board certified advanced clinical practitioner to complete your personal care plan based on your specific symptoms.    Thank you for using e-Visits.    "

## 2024-09-13 ENCOUNTER — Ambulatory Visit: Admitting: Family Medicine

## 2025-02-22 ENCOUNTER — Inpatient Hospital Stay: Attending: Hematology and Oncology

## 2025-03-01 ENCOUNTER — Inpatient Hospital Stay: Attending: Hematology and Oncology | Admitting: Physician Assistant
